# Patient Record
Sex: Female | Born: 1950
Health system: Southern US, Community
[De-identification: ages and names within clinical notes are randomized; demographics above are authoritative.]

## PROBLEM LIST (undated history)

## (undated) DIAGNOSIS — J449 Chronic obstructive pulmonary disease, unspecified: Secondary | ICD-10-CM

## (undated) DIAGNOSIS — Z972 Presence of dental prosthetic device (complete) (partial): Secondary | ICD-10-CM

## (undated) DIAGNOSIS — M1990 Unspecified osteoarthritis, unspecified site: Secondary | ICD-10-CM

## (undated) DIAGNOSIS — J45909 Unspecified asthma, uncomplicated: Secondary | ICD-10-CM

## (undated) DIAGNOSIS — R519 Headache, unspecified: Secondary | ICD-10-CM

## (undated) DIAGNOSIS — K219 Gastro-esophageal reflux disease without esophagitis: Secondary | ICD-10-CM

## (undated) DIAGNOSIS — K529 Noninfective gastroenteritis and colitis, unspecified: Secondary | ICD-10-CM

## (undated) DIAGNOSIS — E785 Hyperlipidemia, unspecified: Secondary | ICD-10-CM

## (undated) DIAGNOSIS — Z973 Presence of spectacles and contact lenses: Secondary | ICD-10-CM

## (undated) HISTORY — PX: CATARACT EXTRACTION W/ INTRAOCULAR LENS IMPLANT: SHX1309

## (undated) HISTORY — PX: ABDOMINAL HYSTERECTOMY: SHX81

## (undated) HISTORY — PX: CERVICAL FUSION: SHX112

## (undated) HISTORY — DX: Hyperlipidemia, unspecified: E78.5

## (undated) HISTORY — PX: TOENAIL EXCISION: SHX183

## (undated) HISTORY — PX: COLONOSCOPY: SHX5424

---

## 2002-03-19 DIAGNOSIS — F4320 Adjustment disorder, unspecified: Secondary | ICD-10-CM | POA: Insufficient documentation

## 2002-03-19 DIAGNOSIS — Z720 Tobacco use: Secondary | ICD-10-CM | POA: Insufficient documentation

## 2002-03-19 DIAGNOSIS — G4700 Insomnia, unspecified: Secondary | ICD-10-CM | POA: Insufficient documentation

## 2005-10-30 ENCOUNTER — Ambulatory Visit: Payer: Self-pay | Admitting: Specialist

## 2005-12-05 ENCOUNTER — Ambulatory Visit (HOSPITAL_COMMUNITY): Admission: RE | Admit: 2005-12-05 | Discharge: 2005-12-06 | Payer: Self-pay | Admitting: Neurosurgery

## 2006-03-29 ENCOUNTER — Emergency Department: Payer: Self-pay | Admitting: Emergency Medicine

## 2006-07-01 DIAGNOSIS — J449 Chronic obstructive pulmonary disease, unspecified: Secondary | ICD-10-CM | POA: Insufficient documentation

## 2006-09-11 ENCOUNTER — Ambulatory Visit: Payer: Self-pay | Admitting: Gastroenterology

## 2006-09-11 DIAGNOSIS — K5790 Diverticulosis of intestine, part unspecified, without perforation or abscess without bleeding: Secondary | ICD-10-CM | POA: Insufficient documentation

## 2006-09-11 LAB — HM COLONOSCOPY

## 2006-10-22 ENCOUNTER — Ambulatory Visit: Payer: Self-pay | Admitting: Family Medicine

## 2006-11-05 ENCOUNTER — Ambulatory Visit: Payer: Self-pay | Admitting: Family Medicine

## 2007-01-12 ENCOUNTER — Emergency Department: Payer: Self-pay | Admitting: Emergency Medicine

## 2007-05-06 ENCOUNTER — Ambulatory Visit: Payer: Self-pay | Admitting: Family Medicine

## 2007-05-06 LAB — HM MAMMOGRAPHY

## 2007-12-07 ENCOUNTER — Other Ambulatory Visit: Payer: Self-pay

## 2007-12-07 ENCOUNTER — Emergency Department: Payer: Self-pay | Admitting: Emergency Medicine

## 2007-12-13 ENCOUNTER — Other Ambulatory Visit: Payer: Self-pay

## 2007-12-13 ENCOUNTER — Emergency Department: Payer: Self-pay | Admitting: Emergency Medicine

## 2008-04-24 ENCOUNTER — Emergency Department: Payer: Self-pay | Admitting: Emergency Medicine

## 2008-05-03 ENCOUNTER — Ambulatory Visit: Payer: Self-pay | Admitting: Emergency Medicine

## 2008-10-26 LAB — HM COLONOSCOPY

## 2009-01-05 ENCOUNTER — Emergency Department: Payer: Self-pay | Admitting: Emergency Medicine

## 2011-01-12 ENCOUNTER — Emergency Department: Payer: Self-pay | Admitting: Emergency Medicine

## 2011-01-28 ENCOUNTER — Ambulatory Visit: Payer: Self-pay | Admitting: Family Medicine

## 2012-12-10 ENCOUNTER — Ambulatory Visit: Payer: Self-pay | Admitting: Family Medicine

## 2013-11-12 ENCOUNTER — Other Ambulatory Visit: Payer: Self-pay | Admitting: Urgent Care

## 2013-11-12 LAB — CBC WITH DIFFERENTIAL/PLATELET
BASOS ABS: 0 10*3/uL (ref 0.0–0.1)
BASOS PCT: 1.2 %
EOS ABS: 0.2 10*3/uL (ref 0.0–0.7)
EOS PCT: 5.7 %
HCT: 40.4 % (ref 35.0–47.0)
HGB: 13.2 g/dL (ref 12.0–16.0)
Lymphocyte #: 1.1 10*3/uL (ref 1.0–3.6)
Lymphocyte %: 29.4 %
MCH: 31.7 pg (ref 26.0–34.0)
MCHC: 32.8 g/dL (ref 32.0–36.0)
MCV: 97 fL (ref 80–100)
Monocyte #: 0.3 x10 3/mm (ref 0.2–0.9)
Monocyte %: 8 %
NEUTROS ABS: 2.1 10*3/uL (ref 1.4–6.5)
NEUTROS PCT: 55.7 %
PLATELETS: 264 10*3/uL (ref 150–440)
RBC: 4.18 10*6/uL (ref 3.80–5.20)
RDW: 13.3 % (ref 11.5–14.5)
WBC: 3.8 10*3/uL (ref 3.6–11.0)

## 2013-11-12 LAB — COMPREHENSIVE METABOLIC PANEL
ALBUMIN: 3.5 g/dL (ref 3.4–5.0)
ALK PHOS: 119 U/L — AB
ANION GAP: 11 (ref 7–16)
BILIRUBIN TOTAL: 0.5 mg/dL (ref 0.2–1.0)
BUN: 8 mg/dL (ref 7–18)
CALCIUM: 8.5 mg/dL (ref 8.5–10.1)
CREATININE: 0.59 mg/dL — AB (ref 0.60–1.30)
Chloride: 105 mmol/L (ref 98–107)
Co2: 25 mmol/L (ref 21–32)
GLUCOSE: 124 mg/dL — AB (ref 65–99)
Osmolality: 281 (ref 275–301)
Potassium: 3.9 mmol/L (ref 3.5–5.1)
SGOT(AST): 16 U/L (ref 15–37)
SGPT (ALT): 18 U/L
Sodium: 141 mmol/L (ref 136–145)
TOTAL PROTEIN: 6.8 g/dL (ref 6.4–8.2)

## 2013-11-12 LAB — TSH: Thyroid Stimulating Horm: 1.23 u[IU]/mL

## 2013-11-26 ENCOUNTER — Ambulatory Visit: Payer: Self-pay | Admitting: Gastroenterology

## 2013-11-26 LAB — HM COLONOSCOPY

## 2014-02-08 ENCOUNTER — Emergency Department: Payer: Self-pay | Admitting: Internal Medicine

## 2014-08-14 ENCOUNTER — Emergency Department
Admission: EM | Admit: 2014-08-14 | Discharge: 2014-08-14 | Disposition: A | Discharge status: Home / Self Care | Payer: BLUE CROSS/BLUE SHIELD | Attending: Emergency Medicine | Admitting: Emergency Medicine

## 2014-08-14 ENCOUNTER — Emergency Department: Payer: BLUE CROSS/BLUE SHIELD

## 2014-08-14 ENCOUNTER — Encounter: Payer: Self-pay | Admitting: General Practice

## 2014-08-14 DIAGNOSIS — Y998 Other external cause status: Secondary | ICD-10-CM | POA: Diagnosis not present

## 2014-08-14 DIAGNOSIS — S93602A Unspecified sprain of left foot, initial encounter: Secondary | ICD-10-CM | POA: Diagnosis not present

## 2014-08-14 DIAGNOSIS — Y9389 Activity, other specified: Secondary | ICD-10-CM | POA: Diagnosis not present

## 2014-08-14 DIAGNOSIS — Y9289 Other specified places as the place of occurrence of the external cause: Secondary | ICD-10-CM | POA: Diagnosis not present

## 2014-08-14 DIAGNOSIS — Z720 Tobacco use: Secondary | ICD-10-CM | POA: Diagnosis not present

## 2014-08-14 DIAGNOSIS — S93402A Sprain of unspecified ligament of left ankle, initial encounter: Secondary | ICD-10-CM | POA: Diagnosis not present

## 2014-08-14 DIAGNOSIS — S99922A Unspecified injury of left foot, initial encounter: Secondary | ICD-10-CM | POA: Diagnosis present

## 2014-08-14 DIAGNOSIS — W108XXA Fall (on) (from) other stairs and steps, initial encounter: Secondary | ICD-10-CM | POA: Diagnosis not present

## 2014-08-14 HISTORY — DX: Noninfective gastroenteritis and colitis, unspecified: K52.9

## 2014-08-14 HISTORY — DX: Unspecified asthma, uncomplicated: J45.909

## 2014-08-14 HISTORY — DX: Chronic obstructive pulmonary disease, unspecified: J44.9

## 2014-08-14 MED ORDER — OXYCODONE-ACETAMINOPHEN 5-325 MG PO TABS
1.0000 | ORAL_TABLET | Freq: Once | ORAL | Status: AC
  Administered 2014-08-14: 1 via ORAL

## 2014-08-14 MED ORDER — DIPHENHYDRAMINE HCL 25 MG PO CAPS
25.0000 mg | ORAL_CAPSULE | Freq: Once | ORAL | Status: AC
  Administered 2014-08-14: 25 mg via ORAL

## 2014-08-14 MED ORDER — DIPHENHYDRAMINE HCL 25 MG PO CAPS
ORAL_CAPSULE | ORAL | Status: AC
  Filled 2014-08-14: qty 1

## 2014-08-14 MED ORDER — OXYCODONE-ACETAMINOPHEN 5-325 MG PO TABS: 1.0000 | ORAL_TABLET | Freq: Three times a day (TID) | ORAL | Status: DC | PRN

## 2014-08-14 MED ORDER — OXYCODONE-ACETAMINOPHEN 5-325 MG PO TABS
ORAL_TABLET | ORAL | Status: AC
  Filled 2014-08-14: qty 1

## 2014-08-14 NOTE — ED Notes (Signed)
Pt. Arrived to ed from home with reports of falling down stairs today. Pt reports tripping and falling down accidentally due to lost of balance. Pt reports "i heard a pop". Pt reports painful left foot and ankle pain. Swelling noted to top of left foot. Pt able to move foot but reports increase pain.

## 2014-08-14 NOTE — ED Notes (Signed)
NAD noted at time of D/C. Pt denies questions or concerns. Pt ambulatory to the lobby at this time.  

## 2014-08-14 NOTE — Discharge Instructions (Signed)
Take medication as prescribed. Use crutches and splint as long as pain continues. Apply ice and elevate.  Follow-up with orthopedic next week as needed for continued pain.  Return to the ER for new or worsening concerns.  Ankle Sprain An ankle sprain is an injury to the strong, fibrous tissues (ligaments) that hold the bones of your ankle joint together.  CAUSES An ankle sprain is usually caused by a fall or by twisting your ankle. Ankle sprains most commonly occur when you step on the outer edge of your foot, and your ankle turns inward. People who participate in sports are more prone to these types of injuries.  SYMPTOMS   Pain in your ankle. The pain may be present at rest or only when you are trying to stand or walk.  Swelling.  Bruising. Bruising may develop immediately or within 1 to 2 days after your injury.  Difficulty standing or walking, particularly when turning corners or changing directions. DIAGNOSIS  Your caregiver will ask you details about your injury and perform a physical exam of your ankle to determine if you have an ankle sprain. During the physical exam, your caregiver will press on and apply pressure to specific areas of your foot and ankle. Your caregiver will try to move your ankle in certain ways. An X-ray exam may be done to be sure a bone was not broken or a ligament did not separate from one of the bones in your ankle (avulsion fracture).  TREATMENT  Certain types of braces can help stabilize your ankle. Your caregiver can make a recommendation for this. Your caregiver may recommend the use of medicine for pain. If your sprain is severe, your caregiver may refer you to a surgeon who helps to restore function to parts of your skeletal system (orthopedist) or a physical therapist. HOME CARE INSTRUCTIONS   Apply ice to your injury for 1-2 days or as directed by your caregiver. Applying ice helps to reduce inflammation and pain.  Put ice in a plastic bag.  Place  a towel between your skin and the bag.  Leave the ice on for 15-20 minutes at a time, every 2 hours while you are awake.  Only take over-the-counter or prescription medicines for pain, discomfort, or fever as directed by your caregiver.  Elevate your injured ankle above the level of your heart as much as possible for 2-3 days.  If your caregiver recommends crutches, use them as instructed. Gradually put weight on the affected ankle. Continue to use crutches or a cane until you can walk without feeling pain in your ankle.  If you have a plaster splint, wear the splint as directed by your caregiver. Do not rest it on anything harder than a pillow for the first 24 hours. Do not put weight on it. Do not get it wet. You may take it off to take a shower or bath.  You may have been given an elastic bandage to wear around your ankle to provide support. If the elastic bandage is too tight (you have numbness or tingling in your foot or your foot becomes cold and blue), adjust the bandage to make it comfortable.  If you have an air splint, you may blow more air into it or let air out to make it more comfortable. You may take your splint off at night and before taking a shower or bath. Wiggle your toes in the splint several times per day to decrease swelling. SEEK MEDICAL CARE IF:   You  have rapidly increasing bruising or swelling.  Your toes feel extremely cold or you lose feeling in your foot.  Your pain is not relieved with medicine. SEEK IMMEDIATE MEDICAL CARE IF:  Your toes are numb or blue.  You have severe pain that is increasing. MAKE SURE YOU:   Understand these instructions.  Will watch your condition.  Will get help right away if you are not doing well or get worse. Document Released: 02/18/2005 Document Revised: 11/13/2011 Document Reviewed: 03/02/2011 [facility] Patient Information 2015 [place], [place]. This information is not intended to replace advice given to you by your health  care provider. Make sure you discuss any questions you have with your health care provider.  Foot Sprain The muscles and cord like structures which attach muscle to bone (tendons) that surround the feet are made up of units. A foot sprain can occur at the weakest spot in any of these units. This condition is most often caused by injury to or overuse of the foot, as from playing contact sports, or aggravating a previous injury, or from poor conditioning, or obesity. SYMPTOMS  Pain with movement of the foot.  Tenderness and swelling at the injury site.  Loss of strength is present in moderate or severe sprains. THE THREE GRADES OR SEVERITY OF FOOT SPRAIN ARE:  Mild (Grade I): Slightly pulled muscle without tearing of muscle or tendon fibers or loss of strength.  Moderate (Grade II): Tearing of fibers in a muscle, tendon, or at the attachment to bone, with small decrease in strength.  Severe (Grade III): Rupture of the muscle-tendon-bone attachment, with separation of fibers. Severe sprain requires surgical repair. Often repeating (chronic) sprains are caused by overuse. Sudden (acute) sprains are caused by direct injury or over-use. DIAGNOSIS  Diagnosis of this condition is usually by your own observation. If problems continue, a caregiver may be required for further evaluation and treatment. X-rays may be required to make sure there are not breaks in the bones (fractures) present. Continued problems may require physical therapy for treatment. PREVENTION  Use strength and conditioning exercises appropriate for your sport.  Warm up properly prior to working out.  Use athletic shoes that are made for the sport you are participating in.  Allow adequate time for healing. Early return to activities makes repeat injury more likely, and can lead to an unstable arthritic foot that can result in prolonged disability. Mild sprains generally heal in 3 to 10 days, with moderate and severe sprains  taking 2 to 10 weeks. Your caregiver can help you determine the proper time required for healing. HOME CARE INSTRUCTIONS   Apply ice to the injury for 15-20 minutes, 03-04 times per day. Put the ice in a plastic bag and place a towel between the bag of ice and your skin.  An elastic wrap (like an Ace bandage) may be used to keep swelling down.  Keep foot above the level of the heart, or at least raised on a footstool, when swelling and pain are present.  Try to avoid use other than gentle range of motion while the foot is painful. Do not resume use until instructed by your caregiver. Then begin use gradually, not increasing use to the point of pain. If pain does develop, decrease use and continue the above measures, gradually increasing activities that do not cause discomfort, until you gradually achieve normal use.  Use crutches if and as instructed, and for the length of time instructed.  Keep injured foot and ankle wrapped between  treatments.  Massage foot and ankle for comfort and to keep swelling down. Massage from the toes up towards the knee.  Only take over-the-counter or prescription medicines for pain, discomfort, or fever as directed by your caregiver. SEEK IMMEDIATE MEDICAL CARE IF:   Your pain and swelling increase, or pain is not controlled with medications.  You have loss of feeling in your foot or your foot turns cold or blue.  You develop new, unexplained symptoms, or an increase of the symptoms that brought you to your caregiver. MAKE SURE YOU:   Understand these instructions.  Will watch your condition.  Will get help right away if you are not doing well or get worse. Document Released: 08/10/2001 Document Revised: 05/13/2011 Document Reviewed: 10/08/2007 [facility] Patient Information 2015 [place], [place]. This information is not intended to replace advice given to you by your health care provider. Make sure you discuss any questions you have with your health care  provider.

## 2014-08-14 NOTE — ED Provider Notes (Signed)
[facility] Emergency Department Provider Note  ____________________________________________  Time seen: Approximately 2:05 PM  I have reviewed the triage vital signs and the nursing notes.   HISTORY  Chief Complaint Foot Pain and Fall   HPI Melissa Buck is a 64 y.o. female presents to ER for complaints of left ankle and left foot pain. Patient states that this afternoon she was going down the outside steps at her daughter's house after checking on her dogs and states that she miss stepped on the edge of the step and rolled left foot. Patient states that she then fell forward however didn't hurt anything else. States fell down 3 steps. Denies head injury or loss of consciousness. Patient states that she has been unable to put any weight on her left foot. States pain is 10 out of 10 aching and throbbing. Denies pain radiation.   Denies weakness, head injury, neck or back pain, chest pain, shortness of breath, abdominal pain or other complaints.    Past Medical History  Diagnosis Date  . Asthma   . COPD (chronic obstructive pulmonary disease)   . Colitis   TIA hyperlipidemia  There are no active problems to display for this patient.   Past Surgical History  Procedure Laterality Date  . Colonoscopy      No current outpatient prescriptions on file.  Allergies Review of patient's allergies indicates no known allergies.  Allergies: NSAIDs per pt Gi distress/bleeding   States "has to take benadryl with all pain medication" otherwise will have itching. Denies other side effects, rash, swelling or shortness of breath.  No family history on file.  Social History History  Substance Use Topics  . Smoking status: Current Every Day Smoker -- 0.50 packs/day    Types: Cigarettes  . Smokeless tobacco: Never Used  . Alcohol Use: Yes    Review of Systems Constitutional: No fever/chills Eyes: No visual changes. ENT: No sore throat. Cardiovascular:  Denies chest pain. Respiratory: Denies shortness of breath. Gastrointestinal: No abdominal pain.  No nausea, no vomiting.  No diarrhea.  No constipation. Genitourinary: Negative for dysuria. Musculoskeletal: Negative for back pain. Left foot and ankle pain.  Skin: Negative for rash. Neurological: Negative for headaches, focal weakness or numbness.  10-point ROS otherwise negative.  ____________________________________________   PHYSICAL EXAM:  VITAL SIGNS: ED Triage Vitals  Enc Vitals Group     BP 08/14/14 1238 110/83 mmHg     Pulse Rate 08/14/14 1238 95     Resp 08/14/14 1238 18     Temp 08/14/14 1238 98.1 F (36.7 C)     Temp Source 08/14/14 1238 Oral     SpO2 08/14/14 1238 98 %     Weight 08/14/14 1238 150 lb (68.04 kg)     Height 08/14/14 1238 5\' 1"  (1.549 m)     Head Cir --      Peak Flow --      Pain Score 08/14/14 1239 10     Pain Loc --      Pain Edu? --      Excl. in GC? --     Constitutional: Alert and oriented. Well appearing and in no acute distress. Eyes: Conjunctivae are normal. PERRL. EOMI. Head: Atraumatic. Nose: No congestion/rhinnorhea. Mouth/Throat: Mucous membranes are moist.  Oropharynx non-erythematous. Neck: No stridor.  No cervical spine tenderness to palpation. Hematological/Lymphatic/Immunilogical: No cervical lymphadenopathy. Cardiovascular: Normal rate, regular rhythm. Grossly normal heart sounds.  Good peripheral circulation. Respiratory: Normal respiratory effort.  No retractions. Lungs  CTAB. Gastrointestinal: Soft and nontender. No distention. No abdominal bruits. No CVA tenderness. Musculoskeletal: No lower extremity tenderness nor edema.  No joint effusions. Left lateral ankle, lateral foot and dorsal foot with mod TTP, no swelling or ecchymosis. Skin intact. Pain with rotation. Sensation and motor intact. Pedal pulses equal bilaterally and easily found.  Neurologic:  Normal speech and language. No gross focal neurologic deficits are  appreciated. Speech is normal. No gait instability. Skin:  Skin is warm, dry and intact. No rash noted. Psychiatric: Mood and affect are normal. Speech and behavior are normal.  ____________________________________________   RADIOLOGY LEFT ANKLE COMPLETE - 3+ VIEW  COMPARISON: None.  FINDINGS: There is no evidence of acute fracture, dislocation, or joint effusion.  There is no evidence of arthropathy or other focal bone abnormality.  Soft tissues are unremarkable.  IMPRESSION: Negative.   Electronically Signed By: Harmon Pier M.D. On: 08/14/2014 15:00   LEFT FOOT - COMPLETE 3+ VIEW  COMPARISON: None.  FINDINGS: There is no evidence of fracture or dislocation. There is no evidence of arthropathy or other focal bone abnormality. Soft tissues are unremarkable.  IMPRESSION: Negative.   Electronically Signed By: Elige Ko On: 08/14/2014 15:02 ____________________________________________   PROCEDURES  Procedure(s) performed:  SPLINT APPLICATION Date/Time: 4:14 PM Authorized by: Renford Dills Consent: Verbal consent obtained. Risks and benefits: risks, benefits and alternatives were discussed Consent given by: patient Splint applied by: ed technician Location details left foot Splint type: velcro stirrup and ace and crutches Post-procedure: The splinted body part was neurovascularly unchanged following the procedure. Patient tolerance: Patient tolerated the procedure well with no immediate complications.   ____________________________________________   INITIAL IMPRESSION / ASSESSMENT AND PLAN / ED COURSE  Pertinent labs & imaging results that were available during my care of the patient were reviewed by me and considered in my medical decision making (see chart for details).  Well appearing. No acute distress. Left foot and ankle pain post twist and fall. Denies other injury. Xray negative for acute bony abnormality. Ice. Elevate and  splint. Follow up with ortho prn. Pt and daughter agree to plan.  ____________________________________________   FINAL CLINICAL IMPRESSION(S) / ED DIAGNOSES  Final diagnoses:  Left ankle sprain, initial encounter  Foot sprain, left, initial encounter      Renford Dills, NP 08/14/14 1618  Jene Every, MD 08/15/14 1745

## 2014-08-20 ENCOUNTER — Other Ambulatory Visit: Payer: Self-pay | Admitting: Family Medicine

## 2014-08-20 DIAGNOSIS — G43809 Other migraine, not intractable, without status migrainosus: Secondary | ICD-10-CM

## 2014-08-22 ENCOUNTER — Other Ambulatory Visit: Payer: Self-pay | Admitting: Family Medicine

## 2014-08-22 DIAGNOSIS — G43809 Other migraine, not intractable, without status migrainosus: Secondary | ICD-10-CM

## 2014-08-22 DIAGNOSIS — G43909 Migraine, unspecified, not intractable, without status migrainosus: Secondary | ICD-10-CM | POA: Insufficient documentation

## 2014-11-08 ENCOUNTER — Other Ambulatory Visit: Payer: Self-pay | Admitting: Family Medicine

## 2014-11-08 DIAGNOSIS — G43809 Other migraine, not intractable, without status migrainosus: Secondary | ICD-10-CM

## 2014-11-08 NOTE — Telephone Encounter (Signed)
Last OV 11/05/2013  Thanks,   -Vernona Rieger

## 2014-11-14 ENCOUNTER — Other Ambulatory Visit: Payer: Self-pay | Admitting: Family Medicine

## 2014-11-14 DIAGNOSIS — F419 Anxiety disorder, unspecified: Secondary | ICD-10-CM | POA: Insufficient documentation

## 2014-11-14 NOTE — Telephone Encounter (Signed)
Last OV 11/2013  Thanks,   -Melissa Buck  

## 2014-11-14 NOTE — Telephone Encounter (Signed)
Ok to call in rx.  Thanks.  

## 2014-12-05 ENCOUNTER — Other Ambulatory Visit: Payer: Self-pay | Admitting: Family Medicine

## 2014-12-05 DIAGNOSIS — G43809 Other migraine, not intractable, without status migrainosus: Secondary | ICD-10-CM

## 2014-12-05 DIAGNOSIS — F419 Anxiety disorder, unspecified: Secondary | ICD-10-CM

## 2014-12-05 NOTE — Telephone Encounter (Signed)
Last OV 11/2013  Thanks,   -Shawan Corella  

## 2014-12-05 NOTE — Telephone Encounter (Signed)
Ok to call in one rx. Needs ov. Thanks.

## 2014-12-05 NOTE — Telephone Encounter (Signed)
Last OV 11/2013  Thanks,   -Laura  

## 2015-01-03 ENCOUNTER — Telehealth: Payer: Self-pay | Admitting: Family Medicine

## 2015-01-03 ENCOUNTER — Other Ambulatory Visit: Payer: Self-pay | Admitting: Family Medicine

## 2015-01-03 DIAGNOSIS — F419 Anxiety disorder, unspecified: Secondary | ICD-10-CM

## 2015-03-10 ENCOUNTER — Encounter: Payer: Self-pay | Admitting: Family Medicine

## 2015-03-10 ENCOUNTER — Ambulatory Visit (INDEPENDENT_AMBULATORY_CARE_PROVIDER_SITE_OTHER): Payer: BLUE CROSS/BLUE SHIELD | Admitting: Family Medicine

## 2015-03-10 VITALS — BP 122/80 | HR 74 | Temp 97.7°F | Resp 16 | Ht 61.5 in | Wt 158.0 lb

## 2015-03-10 DIAGNOSIS — G43809 Other migraine, not intractable, without status migrainosus: Secondary | ICD-10-CM

## 2015-03-10 DIAGNOSIS — F419 Anxiety disorder, unspecified: Secondary | ICD-10-CM

## 2015-03-10 DIAGNOSIS — L309 Dermatitis, unspecified: Secondary | ICD-10-CM | POA: Insufficient documentation

## 2015-03-10 DIAGNOSIS — G43909 Migraine, unspecified, not intractable, without status migrainosus: Secondary | ICD-10-CM | POA: Insufficient documentation

## 2015-03-10 DIAGNOSIS — G4700 Insomnia, unspecified: Secondary | ICD-10-CM

## 2015-03-10 DIAGNOSIS — H9190 Unspecified hearing loss, unspecified ear: Secondary | ICD-10-CM | POA: Insufficient documentation

## 2015-03-10 DIAGNOSIS — R1011 Right upper quadrant pain: Secondary | ICD-10-CM | POA: Insufficient documentation

## 2015-03-10 DIAGNOSIS — E7800 Pure hypercholesterolemia, unspecified: Secondary | ICD-10-CM | POA: Diagnosis not present

## 2015-03-10 DIAGNOSIS — R197 Diarrhea, unspecified: Secondary | ICD-10-CM | POA: Insufficient documentation

## 2015-03-10 MED ORDER — MELOXICAM 15 MG PO TABS: 15.0000 mg | ORAL_TABLET | Freq: Every day | ORAL | Status: DC | PRN

## 2015-03-10 MED ORDER — PROMETHAZINE HCL 25 MG/ML IJ SOLN
25.0000 mg | Freq: Once | INTRAMUSCULAR | Status: AC
  Administered 2015-03-10: 25 mg via INTRAMUSCULAR

## 2015-03-10 MED ORDER — METOCLOPRAMIDE HCL 10 MG PO TABS: 10.0000 mg | ORAL_TABLET | Freq: Three times a day (TID) | ORAL | Status: DC

## 2015-03-10 MED ORDER — TIZANIDINE HCL 4 MG PO TABS
ORAL_TABLET | ORAL | Status: DC
Start: 2015-03-10 — End: 2015-05-10

## 2015-03-10 MED ORDER — KETOROLAC TROMETHAMINE 60 MG/2ML IM SOLN
60.0000 mg | Freq: Once | INTRAMUSCULAR | Status: AC
  Administered 2015-03-10 (×2): 60 mg via INTRAMUSCULAR

## 2015-03-10 MED ORDER — TRAZODONE HCL 100 MG PO TABS: 100.0000 mg | ORAL_TABLET | Freq: Every day | ORAL | Status: DC

## 2015-03-10 MED ORDER — SIMVASTATIN 20 MG PO TABS
ORAL_TABLET | ORAL | Status: DC
Start: 2015-03-10 — End: 2016-01-30

## 2015-03-10 MED ORDER — DIVALPROEX SODIUM ER 500 MG PO TB24: ORAL_TABLET | ORAL | Status: DC

## 2015-03-10 MED ORDER — ESCITALOPRAM OXALATE 20 MG PO TABS: 20.0000 mg | ORAL_TABLET | Freq: Every day | ORAL | Status: DC

## 2015-03-10 NOTE — Progress Notes (Signed)
Patient ID: Melissa PasseyDebra H Studstill, female   DOB: 06/11/1950, 65 y.o.   MRN: [id number]       Patient: Melissa Buck Female    DOB: 11/23/1950   65 y.o.   MRN: [id number] Visit Date: 03/10/2015  Today's Provider: Lorie PhenixNancy Amariss Detamore, MD   Chief Complaint  Patient presents with  . Migraine   Subjective:    HPI   Follow up for Migrain  The patient was last seen for this 15  months ago. Changes made at last visit include started metoclopramide 10 mg.  She reports excellent compliance with treatment. She feels that condition is Improved. She is not having side effects. Patient reports headache started last night. Patient reports she has been out of her medications for about 2 weeks. Patient reports nausea, and photophobia. Patient reports she has been taking OTC Tylenol Extra Strength 3 tablet every 6 hours, pt reports no relief.  ------------------------------------------------------------------------------------      Allergies  Allergen Reactions  . Aspirin   . Erythromycin     Allergic to Mycins.  . Hydrocodone-Acetaminophen Itching    GI Upset  . Sulfa Antibiotics     Throat closes up   Previous Medications   CETIRIZINE (ZYRTEC ALLERGY) 10 MG TABLET    Take 1 tablet by mouth daily.   DIVALPROEX (DEPAKOTE ER) 500 MG 24 HR TABLET    TAKE 2 TABLETS BY MOUTH EVERY EVENING.   ESCITALOPRAM (LEXAPRO) 20 MG TABLET    TAKE 1 TABLET BY MOUTH EVERY DAY   LORAZEPAM (ATIVAN) 0.5 MG TABLET    TAKE 1 TABLET BY MOUTH TWICE DAILY AS NEEDED.   MELOXICAM (MOBIC) 15 MG TABLET    TAKE 1 TABLET BY MOUTH DAILY AS NEEDED   METOCLOPRAMIDE (REGLAN) 10 MG TABLET    Take by mouth. 1/2-1 tablet three times a day as needed for headache   SIMVASTATIN (ZOCOR) 20 MG TABLET    TK 1 T PO HS   TIZANIDINE (ZANAFLEX) 4 MG TABLET    TAKE 1 TO 3 TABLETS BY MOUTH EVERY 8 HOURS AS NEEDED FOR HEADACHE. MAX OF 8 TABLETS PER DAY   TRAZODONE (DESYREL) 100 MG TABLET    Take 1 tablet by mouth daily.    Review of Systems    Constitutional: Negative.   Eyes: Positive for photophobia.  Cardiovascular: Negative.   Gastrointestinal: Positive for nausea.  Neurological: Positive for headaches.    Social History  Substance Use Topics  . Smoking status: Current Every Day Smoker -- 0.50 packs/day    Types: Cigarettes  . Smokeless tobacco: Never Used  . Alcohol Use: 0.6 oz/week    1 Glasses of wine per week   Objective:   BP 122/80 mmHg  Pulse 74  Temp(Src) 97.7 F (36.5 C)  Resp 16  Ht 5' 1.5" (1.562 m)  Wt 158 lb (71.668 kg)  BMI 29.37 kg/m2  SpO2 98%  Physical Exam  Constitutional: She appears well-developed and well-nourished.  Eyes: Conjunctivae and EOM are normal. Pupils are equal, round, and reactive to light.  Neck: Normal range of motion. Neck supple.  Pulmonary/Chest: Effort normal and breath sounds normal.  Neurological: She has normal reflexes.        Assessment & Plan:     1. Other type of migraine Worsening.  Toradol and Phenergan today.  Refill her medication. Recheck ov in 6 months.   - metoCLOPramide (REGLAN) 10 MG tablet; Take 1 tablet (10 mg total) by mouth 3 (three) times daily before meals.  1/2-1 tablet three times a day as needed for headache  Dispense: 30 tablet; Refill: 5 - tiZANidine (ZANAFLEX) 4 MG tablet; TAKE 1 TO 3 TABLETS BY MOUTH EVERY 8 HOURS AS NEEDED FOR HEADACHE. MAX OF 8 TABLETS PER DAY  Dispense: 90 tablet; Refill: 5 - meloxicam (MOBIC) 15 MG tablet; Take 1 tablet (15 mg total) by mouth daily as needed.  Dispense: 30 tablet; Refill: 5 - divalproex (DEPAKOTE ER) 500 MG 24 hr tablet; TAKE 2 TABLETS BY MOUTH EVERY EVENING.  Dispense: 60 tablet; Refill: 5 - ketorolac (TORADOL) injection 60 mg; Inject 2 mLs (60 mg total) into the muscle once. - promethazine (PHENERGAN) injection 25 mg; Inject 1 mL (25 mg total) into the muscle once.  2. Anxiety Stable. - escitalopram (LEXAPRO) 20 MG tablet; Take 1 tablet (20 mg total) by mouth daily.  Dispense: 30 tablet; Refill:  5  3. Hypercholesteremia - simvastatin (ZOCOR) 20 MG tablet; TK 1 T PO HS  Dispense: 30 tablet; Refill: 5  4. Cannot sleep - traZODone (DESYREL) 100 MG tablet; Take 1 tablet (100 mg total) by mouth daily.  Dispense: 30 tablet; Refill: 5     Patient seen and examined by Dr. Leo Grosser, and note scribed by Liz Beach. Dimas, CMA.  I have reviewed the document for accuracy and completeness and I agree with above. - Leo Grosser, MD     Lorie Phenix, MD  [facility]

## 2015-05-10 ENCOUNTER — Encounter: Payer: Self-pay | Admitting: Family Medicine

## 2015-05-10 ENCOUNTER — Ambulatory Visit (INDEPENDENT_AMBULATORY_CARE_PROVIDER_SITE_OTHER): Payer: BLUE CROSS/BLUE SHIELD | Admitting: Family Medicine

## 2015-05-10 ENCOUNTER — Ambulatory Visit
Admission: RE | Admit: 2015-05-10 | Discharge: 2015-05-10 | Disposition: A | Discharge status: Home / Self Care | Payer: BLUE CROSS/BLUE SHIELD | Source: Ambulatory Visit | Attending: Family Medicine | Admitting: Family Medicine

## 2015-05-10 ENCOUNTER — Telehealth: Payer: Self-pay

## 2015-05-10 VITALS — BP 124/68 | HR 92 | Temp 97.9°F | Resp 16 | Wt 156.0 lb

## 2015-05-10 DIAGNOSIS — R05 Cough: Secondary | ICD-10-CM | POA: Insufficient documentation

## 2015-05-10 DIAGNOSIS — R509 Fever, unspecified: Secondary | ICD-10-CM | POA: Diagnosis not present

## 2015-05-10 DIAGNOSIS — J449 Chronic obstructive pulmonary disease, unspecified: Secondary | ICD-10-CM

## 2015-05-10 DIAGNOSIS — J40 Bronchitis, not specified as acute or chronic: Secondary | ICD-10-CM

## 2015-05-10 DIAGNOSIS — Z720 Tobacco use: Secondary | ICD-10-CM | POA: Diagnosis not present

## 2015-05-10 DIAGNOSIS — R059 Cough, unspecified: Secondary | ICD-10-CM

## 2015-05-10 DIAGNOSIS — G43809 Other migraine, not intractable, without status migrainosus: Secondary | ICD-10-CM

## 2015-05-10 LAB — POCT INFLUENZA A/B
INFLUENZA B, POC: NEGATIVE
Influenza A, POC: NEGATIVE

## 2015-05-10 MED ORDER — TIZANIDINE HCL 4 MG PO TABS: ORAL_TABLET | ORAL | Status: DC

## 2015-05-10 MED ORDER — HYDROCODONE-HOMATROPINE 5-1.5 MG/5ML PO SYRP: 5.0000 mL | ORAL_SOLUTION | Freq: Three times a day (TID) | ORAL | Status: DC | PRN

## 2015-05-10 MED ORDER — PREDNISONE 10 MG PO TABS: ORAL_TABLET | ORAL | Status: DC

## 2015-05-10 MED ORDER — OSELTAMIVIR PHOSPHATE 75 MG PO CAPS: 75.0000 mg | ORAL_CAPSULE | Freq: Two times a day (BID) | ORAL | Status: DC

## 2015-05-10 MED ORDER — ALBUTEROL SULFATE HFA 108 (90 BASE) MCG/ACT IN AERS: 2.0000 | INHALATION_SPRAY | Freq: Four times a day (QID) | RESPIRATORY_TRACT | Status: DC | PRN

## 2015-05-10 NOTE — Telephone Encounter (Signed)
-----   Message from Lorie PhenixNancy Maloney, MD sent at 05/10/2015  4:13 PM EST ----- No pneumonia. Thanks.

## 2015-05-10 NOTE — Progress Notes (Signed)
Subjective:    Patient ID: Melissa Buck, female    DOB: 11/06/1950, 65 y.o.   MRN: [id number]  URI  This is a new problem. The current episode started in the past 7 days (for past 3 days. ). The problem has been gradually worsening. The maximum temperature recorded prior to her arrival was 101 - 101.9 F. The fever has been present for 1 to 2 days. Associated symptoms include abdominal pain, chest pain (when she coughs. ), coughing (dry), diarrhea (colitis), headaches, joint pain, nausea, neck pain, rhinorrhea, sneezing, a sore throat, swollen glands and wheezing. Pertinent negatives include no congestion, dysuria, ear pain, plugged ear sensation, rash, sinus pain or vomiting. She has tried acetaminophen and NSAIDs (also meloxicam. ) for the symptoms. The treatment provided mild (helped with fever and myalgias but still with cough and feels bad.  ) relief.   Does still use the vapes.  Has tried to quit smoking. Has had a flu shot.  Does history of bronchitis. No history of pneumonia.     Review of Systems  Constitutional: Positive for chills and fatigue.  HENT: Positive for rhinorrhea, sneezing and sore throat. Negative for congestion and ear pain.   Respiratory: Positive for cough (dry), chest tightness and wheezing. Negative for choking.   Cardiovascular: Positive for chest pain (when she coughs. ).  Gastrointestinal: Positive for nausea, abdominal pain and diarrhea (colitis). Negative for vomiting.  Genitourinary: Negative for dysuria.  Musculoskeletal: Positive for myalgias, joint pain and neck pain.  Skin: Negative for rash.  Neurological: Positive for headaches.   BP 124/68 mmHg  Pulse 92  Temp(Src) 97.9 F (36.6 C) (Oral)  Resp 16  Wt 156 lb (70.761 kg)   Patient Active Problem List   Diagnosis Date Noted  . Abdominal pain, right upper quadrant 03/10/2015  . Dermatitis, eczematoid 03/10/2015  . D (diarrhea) 03/10/2015  . Difficulty hearing 03/10/2015  . Hypercholesteremia  03/10/2015  . Headache, migraine 03/10/2015  . Anxiety 11/14/2014  . Migraine 08/22/2014  . DD (diverticular disease) 09/11/2006  . CAFL (chronic airflow limitation) (HCC) 07/01/2006  . Adaptation reaction 03/19/2002  . Allergic rhinitis 03/19/2002  . Cannot sleep 03/19/2002  . Current tobacco use 03/19/2002   Past Medical History  Diagnosis Date  . Asthma   . COPD (chronic obstructive pulmonary disease) (HCC)   . Colitis    Current Outpatient Prescriptions on File Prior to Visit  Medication Sig  . cetirizine (ZYRTEC ALLERGY) 10 MG tablet Take 1 tablet by mouth daily.  . divalproex (DEPAKOTE ER) 500 MG 24 hr tablet TAKE 2 TABLETS BY MOUTH EVERY EVENING.  Marland Kitchen escitalopram (LEXAPRO) 20 MG tablet Take 1 tablet (20 mg total) by mouth daily.  Marland Kitchen LORazepam (ATIVAN) 0.5 MG tablet TAKE 1 TABLET BY MOUTH TWICE DAILY AS NEEDED.  Marland Kitchen meloxicam (MOBIC) 15 MG tablet Take 1 tablet (15 mg total) by mouth daily as needed.  . metoCLOPramide (REGLAN) 10 MG tablet Take 1 tablet (10 mg total) by mouth 3 (three) times daily before meals. 1/2-1 tablet three times a day as needed for headache  . simvastatin (ZOCOR) 20 MG tablet TK 1 T PO HS  . tiZANidine (ZANAFLEX) 4 MG tablet TAKE 1 TO 3 TABLETS BY MOUTH EVERY 8 HOURS AS NEEDED FOR HEADACHE. MAX OF 8 TABLETS PER DAY  . traZODone (DESYREL) 100 MG tablet Take 1 tablet (100 mg total) by mouth daily.   No current facility-administered medications on file prior to visit.   Allergies  Allergen Reactions  . Aspirin   . Erythromycin     Allergic to Mycins.  . Hydrocodone-Acetaminophen Itching    GI Upset  . Sulfa Antibiotics     Throat closes up   Past Surgical History  Procedure Laterality Date  . Colonoscopy     Social History   Social History  . Marital Status: Divorced    Spouse Name: N/A  . Number of Children: N/A  . Years of Education: N/A   Occupational History  . Not on file.   Social History Main Topics  . Smoking status: Current Every  Day Smoker -- 0.50 packs/day    Types: Cigarettes, E-cigarettes  . Smokeless tobacco: Never Used  . Alcohol Use: 0.6 oz/week    1 Glasses of wine per week  . Drug Use: No  . Sexual Activity: Not on file   Other Topics Concern  . Not on file   Social History Narrative   Family History  Problem Relation Age of Onset  . Healthy Sister        Objective:   Physical Exam  Constitutional: She is oriented to person, place, and time. She appears well-developed and well-nourished.  HENT:  Head: Normocephalic and atraumatic.  Right Ear: External ear normal.  Left Ear: External ear normal.  Mouth/Throat: Oropharynx is clear and moist.  Eyes: Conjunctivae and EOM are normal. Pupils are equal, round, and reactive to light.  Neck: Normal range of motion. Neck supple.  Cardiovascular: Normal rate and regular rhythm.   Pulmonary/Chest: Effort normal and breath sounds normal.  Neurological: She is alert and oriented to person, place, and time.  Psychiatric: She has a normal mood and affect. Her behavior is normal. Judgment and thought content normal.   BP 124/68 mmHg  Pulse 92  Temp(Src) 97.9 F (36.6 C) (Oral)  Resp 16  Wt 156 lb (70.761 kg)     Assessment & Plan:  1. Cough New problem. Worsening. Flu negative. Will check  CXR. Concerning for flu as fever higher than anticipated for  COPD exacerbation.   - POCT Influenza A/B - DG Chest 2 View; Future - HYDROcodone-homatropine (HYCODAN) 5-1.5 MG/5ML syrup; Take 5 mLs by mouth every 8 (eight) hours as needed for cough.  Dispense: 120 mL; Refill: 0  2. Chronic obstructive pulmonary disease, unspecified COPD type (HCC) Condition is worsening. Will increase medication for better control.   - predniSONE (DELTASONE) 10 MG tablet; 6 po for 1 day and then 5 po for 1 day and then 4 po for 1 day and 3 po for 1 day and then 2 po for 1 day and then 1 po for 1 day.  Dispense: 21 tablet; Refill: 0 - DG Chest 2 View; Future - albuterol (PROVENTIL  HFA;VENTOLIN HFA) 108 (90 Base) MCG/ACT inhaler; Inhale 2 puffs into the lungs every 6 (six) hours as needed for wheezing or shortness of breath.  Dispense: 1 Inhaler; Refill: 2  3. Current tobacco use Stressed importance of continued attempts at quitting.    4. Bronchitis As above.    5. Fever, unspecified fever cause Will treat for flu.  - oseltamivir (TAMIFLU) 75 MG capsule; Take 1 capsule (75 mg total) by mouth 2 (two) times daily.  Dispense: 10 capsule; Refill: 0  6. Other type of migraine Refilled medication to be taken as needed.  - tiZANidine (ZANAFLEX) 4 MG tablet; TAKE 1 TO 3 TABLETS BY MOUTH EVERY 8 HOURS AS NEEDED FOR HEADACHE. MAX OF 8 TABLETS PER DAY  Dispense: 90 tablet; Refill: 5   Lorie PhenixNancy Demetrion Wesby, MD

## 2015-05-10 NOTE — Telephone Encounter (Signed)
LMTCB Emily Drozdowski, CMA  

## 2015-05-10 NOTE — Telephone Encounter (Signed)
Left message to call back  

## 2015-05-10 NOTE — Telephone Encounter (Signed)
-----   Message from Lorie PhenixNancy Maloney, MD sent at 05/10/2015  2:38 PM EST ----- No pneumonia.  Please notify patient.  Thanks.

## 2015-05-10 NOTE — Telephone Encounter (Signed)
Advised patient of results.  

## 2015-05-11 ENCOUNTER — Encounter: Payer: Self-pay | Admitting: Family Medicine

## 2015-05-12 ENCOUNTER — Encounter: Payer: Self-pay | Admitting: Family Medicine

## 2015-05-15 ENCOUNTER — Other Ambulatory Visit: Payer: Self-pay | Admitting: Family Medicine

## 2015-05-15 DIAGNOSIS — R059 Cough, unspecified: Secondary | ICD-10-CM

## 2015-05-15 DIAGNOSIS — R05 Cough: Secondary | ICD-10-CM

## 2015-05-15 MED ORDER — HYDROCODONE-HOMATROPINE 5-1.5 MG/5ML PO SYRP: 5.0000 mL | ORAL_SOLUTION | Freq: Three times a day (TID) | ORAL | Status: DC | PRN

## 2015-05-15 NOTE — Telephone Encounter (Signed)
Pt contacted office for refill request on the following medications: HYDROcodone-homatropine (HYCODAN) 5-1.5 MG/5ML syrup.   Pt stated at first she was only taking at night but her cough got so bad she started taking it every 8 hours and it has helped a lot and started breaking things up when she does cough. Pt would like to pick up a refill for the medication if possible. Thanks TNP

## 2015-05-18 NOTE — Telephone Encounter (Signed)
Pt advised.   Thanks,   -Holt Woolbright  

## 2015-07-12 ENCOUNTER — Ambulatory Visit (INDEPENDENT_AMBULATORY_CARE_PROVIDER_SITE_OTHER): Payer: BLUE CROSS/BLUE SHIELD | Admitting: Family Medicine

## 2015-07-12 ENCOUNTER — Encounter: Payer: Self-pay | Admitting: Family Medicine

## 2015-07-12 VITALS — BP 120/70 | HR 76 | Temp 97.9°F | Resp 16 | Ht 61.25 in | Wt 153.0 lb

## 2015-07-12 DIAGNOSIS — Z0000 Encounter for general adult medical examination without abnormal findings: Secondary | ICD-10-CM | POA: Diagnosis not present

## 2015-07-12 DIAGNOSIS — Z1239 Encounter for other screening for malignant neoplasm of breast: Secondary | ICD-10-CM

## 2015-07-12 DIAGNOSIS — G43001 Migraine without aura, not intractable, with status migrainosus: Secondary | ICD-10-CM | POA: Diagnosis not present

## 2015-07-12 DIAGNOSIS — F419 Anxiety disorder, unspecified: Secondary | ICD-10-CM | POA: Diagnosis not present

## 2015-07-12 DIAGNOSIS — Z720 Tobacco use: Secondary | ICD-10-CM

## 2015-07-12 DIAGNOSIS — R319 Hematuria, unspecified: Secondary | ICD-10-CM | POA: Diagnosis not present

## 2015-07-12 DIAGNOSIS — E7800 Pure hypercholesterolemia, unspecified: Secondary | ICD-10-CM | POA: Diagnosis not present

## 2015-07-12 NOTE — Progress Notes (Signed)
Patient ID: LYRICAL SOWLE, female   DOB: 09/09/1950, 65 y.o.   MRN: [id number]       Patient: Melissa Buck, Female    DOB: 1950-05-31, 65 y.o.   MRN: [id number] Visit Date: 07/12/2015  Today's Provider: Lorie Phenix, MD   Chief Complaint  Patient presents with  . Annual Exam   Subjective:    Annual physical exam JARRAH SEHER is a 65 y.o. female who presents today for health maintenance and complete physical. She feels well. She reports exercising daily. She reports she is sleeping fairly well.  05/06/07 Mammogram-BI-RADS 2 11/26/13 Colonoscopy-hyperplastic polyps and colitis, Dr. Servando Snare -----------------------------------------------------------------  Migraine: Patient is requesting to have Botox injection for her migraines. Patient reports that she had one on Monday and lasted several days. Patient reports that she is getting migraines frequently and causes patient to get "sick".  Review of Systems  Constitutional: Negative.   Eyes: Negative.   Respiratory: Negative.   Cardiovascular: Negative.   Gastrointestinal: Negative.   Endocrine: Negative.   Genitourinary: Negative.   Musculoskeletal: Negative.   Skin: Negative.   Allergic/Immunologic: Negative.   Neurological: Positive for headaches.  Hematological: Negative.   Psychiatric/Behavioral: Negative.     Social History      She  reports that she has been smoking Cigarettes and E-cigarettes.  She has been smoking about 0.50 packs per day. She has never used smokeless tobacco. She reports that she drinks about 0.6 oz of alcohol per week. She reports that she does not use illicit drugs.       Social History   Social History  . Marital Status: Divorced    Spouse Name: N/A  . Number of Children: N/A  . Years of Education: N/A   Social History Main Topics  . Smoking status: Current Some Day Smoker -- 0.50 packs/day    Types: Cigarettes, E-cigarettes  . Smokeless tobacco: Never Used  . Alcohol Use: 0.6  oz/week    1 Glasses of wine per week  . Drug Use: No  . Sexual Activity: Not Asked   Other Topics Concern  . None   Social History Narrative    Past Medical History  Diagnosis Date  . Asthma   . COPD (chronic obstructive pulmonary disease) (HCC)   . Colitis      Patient Active Problem List   Diagnosis Date Noted  . Cough 05/10/2015  . Bronchitis 05/10/2015  . Fever 05/10/2015  . Abdominal pain, right upper quadrant 03/10/2015  . Dermatitis, eczematoid 03/10/2015  . D (diarrhea) 03/10/2015  . Difficulty hearing 03/10/2015  . Hypercholesteremia 03/10/2015  . Headache, migraine 03/10/2015  . Anxiety 11/14/2014  . Migraine 08/22/2014  . DD (diverticular disease) 09/11/2006  . CAFL (chronic airflow limitation) (HCC) 07/01/2006  . Adaptation reaction 03/19/2002  . Allergic rhinitis 03/19/2002  . Cannot sleep 03/19/2002  . Current tobacco use 03/19/2002    Past Surgical History  Procedure Laterality Date  . Colonoscopy    . Abdominal hysterectomy      Family History        Family Status  Relation Status Death Age  . Sister Alive   . Mother Deceased 75  . Father Other   . Brother Deceased 6    heart attack        Her family history includes Healthy in her sister; Heart disease in her brother.    Allergies  Allergen Reactions  . Aspirin   . Erythromycin     Allergic to Mycins.  Marland Kitchen  Hydrocodone-Acetaminophen Itching    GI Upset  . Sulfa Antibiotics     Throat closes up    Previous Medications   ALBUTEROL (PROVENTIL HFA;VENTOLIN HFA) 108 (90 BASE) MCG/ACT INHALER    Inhale 2 puffs into the lungs every 6 (six) hours as needed for wheezing or shortness of breath.   CETIRIZINE (ZYRTEC ALLERGY) 10 MG TABLET    Take 1 tablet by mouth daily.   DIVALPROEX (DEPAKOTE ER) 500 MG 24 HR TABLET    TAKE 2 TABLETS BY MOUTH EVERY EVENING.   ESCITALOPRAM (LEXAPRO) 20 MG TABLET    Take 1 tablet (20 mg total) by mouth daily.   LORAZEPAM (ATIVAN) 0.5 MG TABLET    TAKE 1  TABLET BY MOUTH TWICE DAILY AS NEEDED.   MELOXICAM (MOBIC) 15 MG TABLET    Take 1 tablet (15 mg total) by mouth daily as needed.   METOCLOPRAMIDE (REGLAN) 10 MG TABLET    Take 1 tablet (10 mg total) by mouth 3 (three) times daily before meals. 1/2-1 tablet three times a day as needed for headache   SIMVASTATIN (ZOCOR) 20 MG TABLET    TK 1 T PO HS   TIZANIDINE (ZANAFLEX) 4 MG TABLET    TAKE 1 TO 3 TABLETS BY MOUTH EVERY 8 HOURS AS NEEDED FOR HEADACHE. MAX OF 8 TABLETS PER DAY   TRAZODONE (DESYREL) 100 MG TABLET    Take 1 tablet (100 mg total) by mouth daily.    Patient Care Team: Lorie Phenix, MD as PCP - General (Family Medicine)     Objective:   Vitals: BP 120/70 mmHg  Pulse 76  Temp(Src) 97.9 F (36.6 C) (Oral)  Resp 16  Ht 5' 1.25" (1.556 m)  Wt 153 lb (69.4 kg)  BMI 28.66 kg/m2   Physical Exam  Constitutional: She is oriented to person, place, and time. She appears well-developed and well-nourished.  HENT:  Head: Normocephalic and atraumatic.  Right Ear: Tympanic membrane, external ear and ear canal normal.  Left Ear: Tympanic membrane, external ear and ear canal normal.  Nose: Nose normal.  Mouth/Throat: Uvula is midline, oropharynx is clear and moist and mucous membranes are normal.  Eyes: Conjunctivae, EOM and lids are normal. Pupils are equal, round, and reactive to light.  Neck: Trachea normal and normal range of motion. Neck supple. Carotid bruit is not present. No thyroid mass and no thyromegaly present.  Cardiovascular: Normal rate, regular rhythm and normal heart sounds.   Pulmonary/Chest: Effort normal and breath sounds normal.  Abdominal: Soft. Normal appearance and bowel sounds are normal. There is no hepatosplenomegaly. There is no tenderness.  Genitourinary: No breast swelling, tenderness or discharge.  Musculoskeletal: Normal range of motion.  Lymphadenopathy:    She has no cervical adenopathy.    She has no axillary adenopathy.  Neurological: She is alert  and oriented to person, place, and time. She has normal strength. No cranial nerve deficit.  Skin: Skin is warm, dry and intact.  Psychiatric: She has a normal mood and affect. Her speech is normal and behavior is normal. Judgment and thought content normal. Cognition and memory are normal.     Depression Screen PHQ 2/9 Scores 07/12/2015  PHQ - 2 Score 0     Assessment & Plan:     Routine Health Maintenance and Physical Exam  Exercise Activities and Dietary recommendations Goals    None      Immunization History  Administered Date(s) Administered  . Td 09/15/2003  1. Annual physical exam Stable. Patient advised to continue eating healthy and exercise daily. - POCT urinalysis dipstick  2. Breast cancer screening - MM DIGITAL SCREENING BILATERAL; Future  3. Hypercholesteremia - CBC with Differential/Platelet - Comprehensive metabolic panel - Lipid Panel With LDL/HDL Ratio  4. Anxiety - TSH  5. Current tobacco use Recommend continued work on smoking cessation.    6. Migraine without aura and with status migrainosus, not intractable Recurrent. Patient referred to neurology. - Ambulatory referral to Neurology   Patient seen and examined by Dr. Leo GrosserNancy J.. Ragnar Waas, and note scribed by Liz BeachSulibeya S. Dimas, CMA.  I have reviewed the document for accuracy and completeness and I agree with above. Leo Grosser- Joliene Salvador J. Kenzleigh Sedam, MD   Lorie PhenixNancy Koreen Lizaola, MD   --------------------------------------------------------------------

## 2015-07-12 NOTE — Patient Instructions (Signed)
Please call the [facility] at [facility] to schedule this at [phone]   

## 2015-07-14 LAB — URINALYSIS, MICROSCOPIC ONLY
CASTS: NONE SEEN /LPF
WBC, UA: NONE SEEN /hpf (ref 0–?)

## 2015-07-18 ENCOUNTER — Other Ambulatory Visit: Payer: Self-pay | Admitting: Family Medicine

## 2015-07-18 ENCOUNTER — Telehealth: Payer: Self-pay | Admitting: Family Medicine

## 2015-07-18 DIAGNOSIS — F419 Anxiety disorder, unspecified: Secondary | ICD-10-CM

## 2015-07-18 DIAGNOSIS — G43001 Migraine without aura, not intractable, with status migrainosus: Secondary | ICD-10-CM

## 2015-07-18 NOTE — Telephone Encounter (Signed)
Pt called saying she talked to insurance about the Botox injections for migraines.  They told her they would pay if you (Dr. Elease HashimotoMaloney) did a precert for the botox.    Pt;s cll back is [phone]  Thanks Barth Kirkseri

## 2015-07-18 NOTE — Telephone Encounter (Signed)
Printed, please fax or call in to pharmacy. Thank you.   

## 2015-07-19 ENCOUNTER — Telehealth: Payer: Self-pay

## 2015-07-19 LAB — COMPREHENSIVE METABOLIC PANEL
ALT: 10 IU/L (ref 0–32)
AST: 10 IU/L (ref 0–40)
Albumin/Globulin Ratio: 2.2 (ref 1.2–2.2)
Albumin: 4.4 g/dL (ref 3.6–4.8)
Alkaline Phosphatase: 102 IU/L (ref 39–117)
BILIRUBIN TOTAL: 0.6 mg/dL (ref 0.0–1.2)
BUN / CREAT RATIO: 13 (ref 12–28)
BUN: 8 mg/dL (ref 8–27)
CO2: 24 mmol/L (ref 18–29)
Calcium: 9.2 mg/dL (ref 8.7–10.3)
Chloride: 102 mmol/L (ref 96–106)
Creatinine, Ser: 0.62 mg/dL (ref 0.57–1.00)
GFR, EST AFRICAN AMERICAN: 111 mL/min/{1.73_m2} (ref >59)
GFR, EST NON AFRICAN AMERICAN: 96 mL/min/{1.73_m2} (ref >59)
GLUCOSE: 87 mg/dL (ref 65–99)
Globulin, Total: 2 g/dL (ref 1.5–4.5)
Potassium: 4.6 mmol/L (ref 3.5–5.2)
Sodium: 143 mmol/L (ref 134–144)
TOTAL PROTEIN: 6.4 g/dL (ref 6.0–8.5)

## 2015-07-19 LAB — CBC WITH DIFFERENTIAL/PLATELET
BASOS ABS: 0.1 10*3/uL (ref 0.0–0.2)
Basos: 1 %
EOS (ABSOLUTE): 0.2 10*3/uL (ref 0.0–0.4)
Eos: 3 %
Hematocrit: 38.5 % (ref 34.0–46.6)
Hemoglobin: 12.5 g/dL (ref 11.1–15.9)
IMMATURE GRANS (ABS): 0 10*3/uL (ref 0.0–0.1)
Immature Granulocytes: 0 %
LYMPHS: 26 %
Lymphocytes Absolute: 1.7 10*3/uL (ref 0.7–3.1)
MCH: 31.5 pg (ref 26.6–33.0)
MCHC: 32.5 g/dL (ref 31.5–35.7)
MCV: 97 fL (ref 79–97)
Monocytes Absolute: 0.7 10*3/uL (ref 0.1–0.9)
Monocytes: 11 %
NEUTROS ABS: 3.8 10*3/uL (ref 1.4–7.0)
NEUTROS PCT: 59 %
PLATELETS: 291 10*3/uL (ref 150–379)
RBC: 3.97 x10E6/uL (ref 3.77–5.28)
RDW: 13.9 % (ref 12.3–15.4)
WBC: 6.5 10*3/uL (ref 3.4–10.8)

## 2015-07-19 LAB — LIPID PANEL WITH LDL/HDL RATIO
Cholesterol, Total: 183 mg/dL (ref 100–199)
HDL: 57 mg/dL (ref >39)
LDL Calculated: 83 mg/dL (ref 0–99)
LDL/HDL RATIO: 1.5 ratio (ref 0.0–3.2)
Triglycerides: 213 mg/dL — ABNORMAL HIGH (ref 0–149)
VLDL CHOLESTEROL CAL: 43 mg/dL — AB (ref 5–40)

## 2015-07-19 LAB — TSH: TSH: 3.22 u[IU]/mL (ref 0.450–4.500)

## 2015-07-19 NOTE — Telephone Encounter (Signed)
Pt advised.   Thanks,   -Nkenge Sonntag  

## 2015-07-19 NOTE — Telephone Encounter (Signed)
-----   Message from Lorie PhenixNancy Maloney, MD sent at 07/19/2015  7:18 AM EDT ----- Labs stable. Please notify patient. Thanks.

## 2015-07-28 ENCOUNTER — Ambulatory Visit
Admission: RE | Admit: 2015-07-28 | Discharge: 2015-07-28 | Disposition: A | Discharge status: Home / Self Care | Payer: BLUE CROSS/BLUE SHIELD | Source: Ambulatory Visit | Attending: Family Medicine | Admitting: Family Medicine

## 2015-07-28 DIAGNOSIS — Z1231 Encounter for screening mammogram for malignant neoplasm of breast: Secondary | ICD-10-CM | POA: Insufficient documentation

## 2015-07-28 DIAGNOSIS — Z1239 Encounter for other screening for malignant neoplasm of breast: Secondary | ICD-10-CM

## 2015-09-07 ENCOUNTER — Ambulatory Visit: Payer: BLUE CROSS/BLUE SHIELD | Admitting: Family Medicine

## 2015-10-10 NOTE — Telephone Encounter (Signed)
error 

## 2015-10-23 ENCOUNTER — Other Ambulatory Visit: Payer: Self-pay | Admitting: Family Medicine

## 2015-10-23 NOTE — Telephone Encounter (Signed)
Pt called back to see if RX was ready. Thanks TNP

## 2015-10-23 NOTE — Telephone Encounter (Signed)
Pt contacted office for refill request on the following medications: HYDROcodone-homatropine (HYCODAN) 5-1.5 MG/5ML syrup  This was a Dr. Elease HashimotoMaloney pt and she has been coughing. Pt stated that Dr. Elease HashimotoMaloney wrote this for her in the past and she would like to pick up an RX today if possible. I advised that she may need an appt to see Dr. Sherrie MustacheFisher. Please advise. Thanks TNP

## 2015-10-23 NOTE — Telephone Encounter (Signed)
Please advise 

## 2015-10-24 ENCOUNTER — Ambulatory Visit (INDEPENDENT_AMBULATORY_CARE_PROVIDER_SITE_OTHER): Payer: BLUE CROSS/BLUE SHIELD | Admitting: Family Medicine

## 2015-10-24 ENCOUNTER — Encounter: Payer: Self-pay | Admitting: Family Medicine

## 2015-10-24 VITALS — BP 92/70 | HR 76 | Temp 97.8°F | Resp 16 | Wt 150.0 lb

## 2015-10-24 DIAGNOSIS — B349 Viral infection, unspecified: Secondary | ICD-10-CM

## 2015-10-24 MED ORDER — HYDROCODONE-HOMATROPINE 5-1.5 MG/5ML PO SYRP: ORAL_SOLUTION | ORAL | 0 refills | Status: DC

## 2015-10-24 MED ORDER — DOXYCYCLINE HYCLATE 100 MG PO TABS: 100.0000 mg | ORAL_TABLET | Freq: Two times a day (BID) | ORAL | 0 refills | Status: DC

## 2015-10-24 NOTE — Telephone Encounter (Signed)
Pt advised; made apt to see Nadine CountsBob today at 3pm.   Thanks,   -Vernona RiegerLaura

## 2015-10-24 NOTE — Patient Instructions (Addendum)
Discussed use of Mucinex D and Delsym for cough. Schedule your inhaler twice daily while ill.

## 2015-10-24 NOTE — Progress Notes (Signed)
Subjective:     Patient ID: Kerman PasseyDebra H Aikey, female   DOB: 01/24/1951, 65 y.o.   MRN: [id number]  HPI  Chief Complaint  Patient presents with  . URI    Patient comes in office today with complaints of cough and congestion for the past 4 days. Patient states that her cough is productive green/yellowish like phlegm, sinus pain and pressure, migraine headaches, and ear pain. Patient has tried taking otc Mucinex, Nightquil, Tylenol and allergy medication for relief.   States she was exposed to sick grandchildren. [place] Denies specific tick exposure. Has continued to smoke while ill and has used her inhaler on one occasion.   Review of Systems     Objective:   Physical Exam  Constitutional: She appears well-developed and well-nourished. No distress.  Ears: T.M's intact without inflammation Throat: no tonsillar enlargement or exudate Neck: no cervical adenopathy Lungs: clear after cough     Assessment:    1. Viral syndrome: consider tick fever/COPD exacerbation. - doxycycline (VIBRA-TABS) 100 MG tablet; Take 1 tablet (100 mg total) by mouth 2 (two) times daily.  Dispense: 20 tablet; Refill: 0 - HYDROcodone-homatropine (HYCODAN) 5-1.5 MG/5ML syrup; 5 ml 4-6 hours as needed for cough  Dispense: 240 mL; Refill: 0    Plan:    Discussed use of otc medication and scheduling albuterol MDI while ill.

## 2015-10-24 NOTE — Telephone Encounter (Signed)
This is a schedule II medication and cannot be prescribed without being seen and examined and documenting medical necessity. If she has fever or shortness of breath she needs office visit. Otherwise she should try OTC Robitussin DM

## 2015-10-24 NOTE — Telephone Encounter (Signed)
LMOVM for pt to return call 

## 2016-01-30 ENCOUNTER — Other Ambulatory Visit: Payer: Self-pay | Admitting: Family Medicine

## 2016-01-30 DIAGNOSIS — F419 Anxiety disorder, unspecified: Secondary | ICD-10-CM

## 2016-01-30 DIAGNOSIS — E7800 Pure hypercholesterolemia, unspecified: Secondary | ICD-10-CM

## 2016-01-30 NOTE — Telephone Encounter (Signed)
Is Dr. Santiago BurMaloney's pt. You are the only other provider pt has seen. Was an acute visit in August. No FU scheduled. Allene DillonEmily Drozdowski, CMA

## 2016-03-01 ENCOUNTER — Encounter: Payer: Self-pay | Admitting: Family Medicine

## 2016-03-01 ENCOUNTER — Ambulatory Visit (INDEPENDENT_AMBULATORY_CARE_PROVIDER_SITE_OTHER): Payer: BLUE CROSS/BLUE SHIELD | Admitting: Family Medicine

## 2016-03-01 VITALS — BP 100/64 | HR 64 | Temp 97.0°F | Wt 147.6 lb

## 2016-03-01 DIAGNOSIS — J0100 Acute maxillary sinusitis, unspecified: Secondary | ICD-10-CM

## 2016-03-01 MED ORDER — AMOXICILLIN-POT CLAVULANATE 875-125 MG PO TABS
1.0000 | ORAL_TABLET | Freq: Two times a day (BID) | ORAL | 0 refills | Status: DC
Start: 2016-03-01 — End: 2016-05-26

## 2016-03-01 NOTE — Progress Notes (Signed)
Subjective:     Patient ID: Melissa Buck, female   DOB: 08/17/1950, 65 y.o.   MRN: [id number]  HPI  Chief Complaint  Patient presents with  . URI    Patient complains of headache, bilateral ear pain, head congestion X 2 weeks. Patient has been taking Mucinex for symptoms with mild relief.  Patient reports increased sinus pressure, purulent sinus drainage, post nasal drainage and accompanying cough. Has been using Mucinex D, Mucinex DM, and Delsym for her sx.   Review of Systems     Objective:   Physical Exam  Constitutional: She appears well-developed and well-nourished.  Ears: T.M's intact without inflammation Sinuses: mild maxillary sinus tenderness Throat:tonsils absent Neck: bilateral anterior cervical nodes Lungs: clear     Assessment:    1. Acute maxillary sinusitis, recurrence not specified - amoxicillin-clavulanate (AUGMENTIN) 875-125 MG tablet; Take 1 tablet by mouth 2 (two) times daily.  Dispense: 20 tablet; Refill: 0    Plan:    Continue current otc products. Minimize smoking.

## 2016-03-01 NOTE — Patient Instructions (Signed)
Continue the over the counter products you are already using and minimize smoking while ill.

## 2016-03-30 ENCOUNTER — Other Ambulatory Visit: Payer: Self-pay | Admitting: Family Medicine

## 2016-03-30 DIAGNOSIS — F419 Anxiety disorder, unspecified: Secondary | ICD-10-CM

## 2016-04-19 ENCOUNTER — Other Ambulatory Visit: Payer: Self-pay | Admitting: Family Medicine

## 2016-04-19 MED ORDER — TIZANIDINE HCL 4 MG PO TABS: ORAL_TABLET | ORAL | 5 refills | Status: DC

## 2016-05-15 ENCOUNTER — Other Ambulatory Visit: Payer: Self-pay | Admitting: Family Medicine

## 2016-05-15 DIAGNOSIS — F419 Anxiety disorder, unspecified: Secondary | ICD-10-CM

## 2016-05-15 MED ORDER — ESCITALOPRAM OXALATE 20 MG PO TABS: 20.0000 mg | ORAL_TABLET | Freq: Every day | ORAL | 1 refills | Status: DC

## 2016-05-26 ENCOUNTER — Emergency Department
Admission: EM | Admit: 2016-05-26 | Discharge: 2016-05-26 | Disposition: A | Discharge status: Home / Self Care | Payer: BLUE CROSS/BLUE SHIELD | Attending: Emergency Medicine | Admitting: Emergency Medicine

## 2016-05-26 ENCOUNTER — Emergency Department: Payer: BLUE CROSS/BLUE SHIELD

## 2016-05-26 DIAGNOSIS — F17210 Nicotine dependence, cigarettes, uncomplicated: Secondary | ICD-10-CM | POA: Diagnosis not present

## 2016-05-26 DIAGNOSIS — R252 Cramp and spasm: Secondary | ICD-10-CM | POA: Diagnosis not present

## 2016-05-26 DIAGNOSIS — R079 Chest pain, unspecified: Secondary | ICD-10-CM | POA: Diagnosis present

## 2016-05-26 DIAGNOSIS — Z79899 Other long term (current) drug therapy: Secondary | ICD-10-CM | POA: Diagnosis not present

## 2016-05-26 DIAGNOSIS — J449 Chronic obstructive pulmonary disease, unspecified: Secondary | ICD-10-CM | POA: Diagnosis not present

## 2016-05-26 DIAGNOSIS — J45909 Unspecified asthma, uncomplicated: Secondary | ICD-10-CM | POA: Diagnosis not present

## 2016-05-26 LAB — CBC
HCT: 36.6 % (ref 35.0–47.0)
Hemoglobin: 12.5 g/dL (ref 12.0–16.0)
MCH: 31.8 pg (ref 26.0–34.0)
MCHC: 34.3 g/dL (ref 32.0–36.0)
MCV: 92.9 fL (ref 80.0–100.0)
PLATELETS: 318 10*3/uL (ref 150–440)
RBC: 3.94 MIL/uL (ref 3.80–5.20)
RDW: 14.5 % (ref 11.5–14.5)
WBC: 8.3 10*3/uL (ref 3.6–11.0)

## 2016-05-26 LAB — ETHANOL: Alcohol, Ethyl (B): 48 mg/dL — ABNORMAL HIGH (ref <5)

## 2016-05-26 LAB — COMPREHENSIVE METABOLIC PANEL
ALT: 14 U/L (ref 14–54)
AST: 19 U/L (ref 15–41)
Albumin: 3.9 g/dL (ref 3.5–5.0)
Alkaline Phosphatase: 89 U/L (ref 38–126)
Anion gap: 10 (ref 5–15)
BUN: 8 mg/dL (ref 6–20)
CHLORIDE: 106 mmol/L (ref 101–111)
CO2: 23 mmol/L (ref 22–32)
CREATININE: 0.81 mg/dL (ref 0.44–1.00)
Calcium: 8.4 mg/dL — ABNORMAL LOW (ref 8.9–10.3)
GFR calc Af Amer: >60 mL/min (ref >60)
Glucose, Bld: 127 mg/dL — ABNORMAL HIGH (ref 65–99)
POTASSIUM: 3.2 mmol/L — AB (ref 3.5–5.1)
SODIUM: 139 mmol/L (ref 135–145)
Total Bilirubin: 0.6 mg/dL (ref 0.3–1.2)
Total Protein: 6.5 g/dL (ref 6.5–8.1)

## 2016-05-26 LAB — MAGNESIUM: Magnesium: 2 mg/dL (ref 1.7–2.4)

## 2016-05-26 LAB — CK: CK TOTAL: 74 U/L (ref 38–234)

## 2016-05-26 LAB — TROPONIN I

## 2016-05-26 MED ORDER — DIAZEPAM 5 MG PO TABS
5.0000 mg | ORAL_TABLET | Freq: Once | ORAL | Status: AC
  Administered 2016-05-26: 5 mg via ORAL
  Filled 2016-05-26: qty 1

## 2016-05-26 MED ORDER — SODIUM CHLORIDE 0.9 % IV SOLN
1.0000 g | Freq: Once | INTRAVENOUS | Status: DC
  Filled 2016-05-26: qty 10

## 2016-05-26 MED ORDER — SODIUM CHLORIDE 0.9 % IV SOLN
1.0000 g | Freq: Once | INTRAVENOUS | Status: DC
Start: 2016-05-26 — End: 2016-05-26
  Filled 2016-05-26: qty 10

## 2016-05-26 MED ORDER — POTASSIUM CHLORIDE 20 MEQ/15ML (10%) PO SOLN
40.0000 meq | Freq: Once | ORAL | Status: AC
  Administered 2016-05-26: 40 meq via ORAL
  Filled 2016-05-26: qty 30

## 2016-05-26 MED ORDER — DIAZEPAM 5 MG PO TABS: 5.0000 mg | ORAL_TABLET | Freq: Three times a day (TID) | ORAL | 0 refills | Status: DC | PRN

## 2016-05-26 MED ORDER — LORAZEPAM 2 MG/ML IJ SOLN
1.0000 mg | Freq: Once | INTRAMUSCULAR | Status: AC
  Administered 2016-05-26: 1 mg via INTRAVENOUS
  Filled 2016-05-26: qty 1

## 2016-05-26 MED ORDER — SODIUM CHLORIDE 0.9 % IV SOLN
1.0000 g | Freq: Once | INTRAVENOUS | Status: AC
  Administered 2016-05-26: 1 g via INTRAVENOUS
  Filled 2016-05-26: qty 10

## 2016-05-26 MED ORDER — POTASSIUM CHLORIDE IN NACL 20-0.9 MEQ/L-% IV SOLN
Freq: Once | INTRAVENOUS | Status: AC
  Administered 2016-05-26: 19:00:00 via INTRAVENOUS
  Filled 2016-05-26: qty 1000

## 2016-05-26 NOTE — ED Notes (Signed)
Jacki ConesLaurie, RN called lab to add on CK

## 2016-05-26 NOTE — ED Provider Notes (Signed)
[facility] Emergency Department Provider Note   ____________________________________________   First MD Initiated Contact with Patient 05/26/16 1640     (approximate)  I have reviewed the triage vital signs and the nursing notes.   HISTORY  Chief Complaint Chest Pain    HPI Melissa Buck is a 66 y.o. female who reports cramping and and spasm of her hands bilaterally she also complains of twitching in her muscles in her face. Her mouth was tight in her muscles throughout her body including her chest or tight. She does not have any spasm of the face when the facial nerve is intact. She reports she's never had this kind of spasming before.  Past Medical History:  Diagnosis Date  . Asthma   . Colitis   . COPD (chronic obstructive pulmonary disease) [facility])     Patient Active Problem List   Diagnosis Date Noted  . Cough 05/10/2015  . Bronchitis 05/10/2015  . Fever 05/10/2015  . Abdominal pain, right upper quadrant 03/10/2015  . Dermatitis, eczematoid 03/10/2015  . D (diarrhea) 03/10/2015  . Difficulty hearing 03/10/2015  . Hypercholesteremia 03/10/2015  . Headache, migraine 03/10/2015  . Anxiety 11/14/2014  . Migraine 08/22/2014  . DD (diverticular disease) 09/11/2006  . CAFL (chronic airflow limitation) (HCC) 07/01/2006  . Adaptation reaction 03/19/2002  . Allergic rhinitis 03/19/2002  . Cannot sleep 03/19/2002  . Current tobacco use 03/19/2002    Past Surgical History:  Procedure Laterality Date  . ABDOMINAL HYSTERECTOMY    . COLONOSCOPY      Prior to Admission medications   Medication Sig Start Date End Date Taking? Authorizing Provider  albuterol (PROVENTIL HFA;VENTOLIN HFA) 108 (90 Base) MCG/ACT inhaler Inhale 2 puffs into the lungs every 6 (six) hours as needed for wheezing or shortness of breath. 05/10/15  Yes Lorie Phenix, MD  escitalopram (LEXAPRO) 20 MG tablet Take 1 tablet (20 mg total) by mouth daily. 05/15/16  Yes Anola Gurney, PA  fexofenadine (ALLEGRA) 180 MG tablet Take 180 mg by mouth daily.   Yes Historical Provider, MD  LORazepam (ATIVAN) 0.5 MG tablet TAKE 1 TABLET BY MOUTH TWICE DAILY AS NEEDED. 07/18/15  Yes Lorie Phenix, MD  meloxicam (MOBIC) 15 MG tablet Take 1 tablet (15 mg total) by mouth daily as needed. 03/10/15  Yes Lorie Phenix, MD  metoCLOPramide (REGLAN) 10 MG tablet Take 1 tablet (10 mg total) by mouth 3 (three) times daily before meals. 1/2-1 tablet three times a day as needed for headache 03/10/15  Yes Lorie Phenix, MD  simvastatin (ZOCOR) 20 MG tablet TAKE 1 TABLET BY MOUTH AT BEDTIME 01/30/16  Yes Anola Gurney, PA  tiZANidine (ZANAFLEX) 4 MG tablet TAKE 1 TO 3 TABLETS BY MOUTH EVERY 8 HOURS AS NEEDED FOR HEADACHE. MAX OF 8 TABLETS PER DAY 04/19/16  Yes Anola Gurney, PA  traZODone (DESYREL) 100 MG tablet Take 1 tablet (100 mg total) by mouth daily. 03/10/15  Yes Lorie Phenix, MD  divalproex (DEPAKOTE ER) 500 MG 24 hr tablet TAKE 2 TABLETS BY MOUTH EVERY EVENING. Patient not taking: Reported on 05/26/2016 03/10/15   Lorie Phenix, MD    Allergies Aspirin; Erythromycin; Hydrocodone-acetaminophen; and Sulfa antibiotics  Family History  Problem Relation Age of Onset  . Healthy Sister   . Heart disease Brother   . Breast cancer Maternal Aunt     Social History Social History  Substance Use Topics  . Smoking status: Current Some Day Smoker    Packs/day: 0.50  Types: Cigarettes  . Smokeless tobacco: Never Used  . Alcohol use 0.6 oz/week    1 Glasses of wine per week    Review of Systems Constitutional: No fever/chills Eyes: No visual changes. ENT: No sore throat. Cardiovascular: Chest muscle tightness Respiratory: Denies shortness of breath. Gastrointestinal: No abdominal pain.  No nausea, no vomiting.  No diarrhea.  No constipation. Genitourinary: Negative for dysuria. Musculoskeletal: Negative for back pain. Skin: Negative for rash. Neurological: Negative for headaches,  focal weakness or numbness.  10-point ROS otherwise negative.  ____________________________________________   PHYSICAL EXAM:  VITAL SIGNS: ED Triage Vitals  Enc Vitals Group     BP 05/26/16 1629 (!) 123/56     Pulse Rate 05/26/16 1629 79     Resp 05/26/16 1629 18     Temp 05/26/16 1629 98.6 F (37 C)     Temp Source 05/26/16 1629 Oral     SpO2 05/26/16 1629 96 %     Weight 05/26/16 1630 140 lb (63.5 kg)     Height 05/26/16 1630 5\' 1"  (1.549 m)     Head Circumference --      Peak Flow --      Pain Score 05/26/16 1630 9     Pain Loc --      Pain Edu? --      Excl. in GC? --     Constitutional: Alert and oriented.  Eyes: Conjunctivae are normal. PERRL. EOMI. Head: Atraumatic. Nose: No congestion/rhinnorhea. Mouth/Throat: Mucous membranes are moist.  Oropharynx non-erythematous. Neck: No stridor.   Cardiovascular: Normal rate, regular rhythm. Grossly normal heart sounds.  Good peripheral circulation. Respiratory: Normal respiratory effort.  No retractions. Lungs CTAB. Gastrointestinal: Soft and nontender. No distention. No abdominal bruits. No CVA tenderness. Musculoskeletal: Muscles are tight and crampy. Patient has carpal spasm. She does not have any spasm of the facial muscles on tapping of the facial nerve. Neurologic:  Normal speech and language. No gross focal neurologic deficits are appreciated.  Skin:  Skin is warm, dry and intact. No rash noted.   ____________________________________________   LABS (all labs ordered are listed, but only abnormal results are displayed)  Labs Reviewed  COMPREHENSIVE METABOLIC PANEL - Abnormal; Notable for the following:       Result Value   Potassium 3.2 (*)    Glucose, Bld 127 (*)    Calcium 8.4 (*)    All other components within normal limits  ETHANOL - Abnormal; Notable for the following:    Alcohol, Ethyl (B) 48 (*)    All other components within normal limits  CBC  TROPONIN I  MAGNESIUM  TROPONIN I  CK    ____________________________________________  EKG  EKG read and interpreted by me shows normal sinus rhythm rate of 78 normal axis essentially normal EKG ____________________________________________  RADIOLOGY  Study Result   CLINICAL DATA:  Chest pain  EXAM: PORTABLE CHEST 1 VIEW  COMPARISON:  05/10/2015  FINDINGS: Cardiomediastinal silhouette is stable. No infiltrate or pleural effusion. No pulmonary edema. Bony thorax is unremarkable.  IMPRESSION: No active disease.   Electronically Signed   By: Natasha MeadLiviu  Pop M.D.   On: 05/26/2016 17:10    ____________________________________________   PROCEDURES  Procedure(s) performed:  Procedures  Critical Care performed:   ____________________________________________   INITIAL IMPRESSION / ASSESSMENT AND PLAN / ED COURSE  Pertinent labs & imaging results that were available during my care of the patient were reviewed by me and considered in my medical decision making (see chart for details).  -----------------------------------------  8:56 PM on 05/26/2016 -----------------------------------------  Patient's cramps improve after the Valium by mouth not after the Ativan. They're even better after electrolyte replacement.      ____________________________________________   FINAL CLINICAL IMPRESSION(S) / ED DIAGNOSES  Final diagnoses:  Muscle cramps      NEW MEDICATIONS STARTED DURING THIS VISIT:  New Prescriptions   No medications on file     Note:  This document was prepared using Dragon voice recognition software and may include unintentional dictation errors.    Arnaldo Natal, MD 05/26/16 [id number]

## 2016-05-26 NOTE — Discharge Instructions (Signed)
We have given you calcium and potassium 3 your levels and that should be better. If you begin having some spasms again I will give you some Valium he can take 1 pill 3 times a day as needed but do not take it with the Ativan. That may be too much and you might stop breathing. Do not drive on the Valium. Please return for any further problems.

## 2016-05-26 NOTE — ED Triage Notes (Addendum)
Pt states that she started yesterday with "drawing" in her hands bilat, pt states that this has never happened before, pt states that it cont when she woke up this am and reports that she drank 2 glasses of wine to see if it would help. Pt is tearful and feels like the muscles in her mouth are tight and clenching as well as the muscles throughout her body, pt also stated chest tightness

## 2016-07-15 ENCOUNTER — Ambulatory Visit (INDEPENDENT_AMBULATORY_CARE_PROVIDER_SITE_OTHER): Payer: BLUE CROSS/BLUE SHIELD | Admitting: Physician Assistant

## 2016-07-15 ENCOUNTER — Encounter: Payer: Self-pay | Admitting: Physician Assistant

## 2016-07-15 VITALS — BP 112/68 | HR 80 | Temp 98.0°F | Resp 16 | Ht 61.5 in | Wt 144.0 lb

## 2016-07-15 DIAGNOSIS — J309 Allergic rhinitis, unspecified: Secondary | ICD-10-CM | POA: Diagnosis not present

## 2016-07-15 DIAGNOSIS — J449 Chronic obstructive pulmonary disease, unspecified: Secondary | ICD-10-CM | POA: Diagnosis not present

## 2016-07-15 DIAGNOSIS — Z1239 Encounter for other screening for malignant neoplasm of breast: Secondary | ICD-10-CM

## 2016-07-15 DIAGNOSIS — Z720 Tobacco use: Secondary | ICD-10-CM | POA: Diagnosis not present

## 2016-07-15 DIAGNOSIS — E7800 Pure hypercholesterolemia, unspecified: Secondary | ICD-10-CM

## 2016-07-15 DIAGNOSIS — F419 Anxiety disorder, unspecified: Secondary | ICD-10-CM | POA: Diagnosis not present

## 2016-07-15 DIAGNOSIS — Z1231 Encounter for screening mammogram for malignant neoplasm of breast: Secondary | ICD-10-CM | POA: Diagnosis not present

## 2016-07-15 DIAGNOSIS — Z0000 Encounter for general adult medical examination without abnormal findings: Secondary | ICD-10-CM | POA: Diagnosis not present

## 2016-07-15 DIAGNOSIS — Z23 Encounter for immunization: Secondary | ICD-10-CM | POA: Diagnosis not present

## 2016-07-15 DIAGNOSIS — G43001 Migraine without aura, not intractable, with status migrainosus: Secondary | ICD-10-CM

## 2016-07-15 DIAGNOSIS — Z131 Encounter for screening for diabetes mellitus: Secondary | ICD-10-CM

## 2016-07-15 MED ORDER — FLUTICASONE PROPIONATE 50 MCG/ACT NA SUSP: 2.0000 | Freq: Every day | NASAL | 6 refills | Status: DC

## 2016-07-15 MED ORDER — FEXOFENADINE HCL 180 MG PO TABS: 180.0000 mg | ORAL_TABLET | Freq: Every day | ORAL | 1 refills | Status: DC

## 2016-07-15 NOTE — Progress Notes (Signed)
Patient: Melissa Buck, Female    DOB: 11/27/1950, 66 y.o.   MRN: [id number] Visit Date: 07/15/2016  Today's Provider: Trey SailorsAdriana M Pollak, PA-C   Chief Complaint  Patient presents with  . Annual Exam   Subjective:    Annual physical exam Melissa Buck is a 66 y.o. female who presents today for health maintenance and complete physical. She feels fairly well.  She would like to be referred ENT secondary to allergies.   She reports exercising regularly. She reports she is sleeping fairly well.  Colonoscopy 2015: hyperplastic polyps - repeat 2025 Mammogram 07/2015: normal PAP: hysterectomy 20+ years ago, ovaries remain DEXA: done remotely, patient reports normal.   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 The work is draining for her. She takes Ativan to help with the stress, mostly when she has hospital shifts. Also taking Lexapro 20 mg.  She is living in [place] alone with her cat and her dog. She is currently restarting relationship with her ex-husband. She has three kids and three grandkids. She is currently smoking six cigarettes per day. She has smoked 1/2 pack daily for 49 years.  She has a history of migraines. She [facility] Welton FlakesKhan [facility] [place] many years ago. She reports trying many medications. Most recently, she was on Depakote ER 500 mg daily for prophylaxis which she self discontinued within the past year 2/2 memory issues and confusion. She currently uses Mobic 15 mg, Reglan, and Zanaflex for her migraines PRN. This month she reports 10 migraines.    Allergic Rhinitis  Patient reports sinus and nasal congestion, itchy water eyes. Dog and cat sleep in the room with her. She says Joyce Copallegra has worked well in the past. Not taking flonase currently but had success with it.  -----------------------------------------------------------------   Review of Systems  Constitutional: Negative.   HENT: Negative.   Eyes:  Negative.   Respiratory: Negative.   Cardiovascular: Negative.   Genitourinary: Negative.   Musculoskeletal: Negative.   Skin: Negative.   Allergic/Immunologic: Positive for environmental allergies.  Neurological: Negative.   Hematological: Negative.   Psychiatric/Behavioral: Negative.     Social History      She  reports that she has been smoking Cigarettes.  She has been smoking about 0.50 packs per day. She has never used smokeless tobacco. She reports that she drinks about 0.6 oz of alcohol per week . She reports that she does not use drugs.       Social History   Social History  . Marital status: Divorced    Spouse name: N/A  . Number of children: N/A  . Years of education: N/A   Social History Main Topics  . Smoking status: Current Some Day Smoker    Packs/day: 0.50    Types: Cigarettes  . Smokeless tobacco: Never Used  . Alcohol use 0.6 oz/week    1 Glasses of wine per week  . Drug use: No  . Sexual activity: Not Asked   Other Topics Concern  . None   Social History Narrative  . None    Past Medical History:  Diagnosis Date  . Asthma   . Colitis   . COPD (chronic obstructive pulmonary disease) [facility](HCC)      Patient Active Problem List   Diagnosis Date Noted  . Cough 05/10/2015  . Bronchitis 05/10/2015  . Fever 05/10/2015  . Abdominal pain, right upper quadrant 03/10/2015  .  Dermatitis, eczematoid 03/10/2015  . D (diarrhea) 03/10/2015  . Difficulty hearing 03/10/2015  . Hypercholesteremia 03/10/2015  . Headache, migraine 03/10/2015  . Anxiety 11/14/2014  . Migraine 08/22/2014  . DD (diverticular disease) 09/11/2006  . CAFL (chronic airflow limitation) (HCC) 07/01/2006  . Adaptation reaction 03/19/2002  . Allergic rhinitis 03/19/2002  . Cannot sleep 03/19/2002  . Current tobacco use 03/19/2002    Past Surgical History:  Procedure Laterality Date  . ABDOMINAL HYSTERECTOMY    . COLONOSCOPY      Family History        Family Status  Relation  Status  . Sister Alive  . Brother Deceased at age 97       heart attack  . Mother Deceased at age 31  . Father Other  . Mat Aunt (Not Specified)        Her family history includes Breast cancer in her maternal aunt; Healthy in her sister; Heart disease in her brother.     Allergies  Allergen Reactions  . Aspirin   . Erythromycin     Allergic to Mycins.  . Hydrocodone-Acetaminophen Itching    GI Upset  . Sulfa Antibiotics     Throat closes up     Current Outpatient Prescriptions:  .  albuterol (PROVENTIL HFA;VENTOLIN HFA) 108 (90 Base) MCG/ACT inhaler, Inhale 2 puffs into the lungs every 6 (six) hours as needed for wheezing or shortness of breath., Disp: 1 Inhaler, Rfl: 2 .  diazepam (VALIUM) 5 MG tablet, Take 1 tablet (5 mg total) by mouth every 8 (eight) hours as needed for anxiety., Disp: 5 tablet, Rfl: 0 .  escitalopram (LEXAPRO) 20 MG tablet, Take 1 tablet (20 mg total) by mouth daily., Disp: 90 tablet, Rfl: 1 .  fexofenadine (ALLEGRA) 180 MG tablet, Take 180 mg by mouth daily., Disp: , Rfl:  .  LORazepam (ATIVAN) 0.5 MG tablet, TAKE 1 TABLET BY MOUTH TWICE DAILY AS NEEDED., Disp: 60 tablet, Rfl: 5 .  meloxicam (MOBIC) 15 MG tablet, Take 1 tablet (15 mg total) by mouth daily as needed., Disp: 30 tablet, Rfl: 5 .  metoCLOPramide (REGLAN) 10 MG tablet, Take 1 tablet (10 mg total) by mouth 3 (three) times daily before meals. 1/2-1 tablet three times a day as needed for headache, Disp: 30 tablet, Rfl: 5 .  simvastatin (ZOCOR) 20 MG tablet, TAKE 1 TABLET BY MOUTH AT BEDTIME, Disp: 90 tablet, Rfl: 1 .  tiZANidine (ZANAFLEX) 4 MG tablet, TAKE 1 TO 3 TABLETS BY MOUTH EVERY 8 HOURS AS NEEDED FOR HEADACHE. MAX OF 8 TABLETS PER DAY, Disp: 90 tablet, Rfl: 5 .  traZODone (DESYREL) 100 MG tablet, Take 1 tablet (100 mg total) by mouth daily., Disp: 30 tablet, Rfl: 5 .  divalproex (DEPAKOTE ER) 500 MG 24 hr tablet, TAKE 2 TABLETS BY MOUTH EVERY EVENING. (Patient not taking: Reported on  05/26/2016), Disp: 60 tablet, Rfl: 5   Patient Care Team: Maryella Shivers as PCP - General (Physician Assistant)      Objective:   Vitals: BP 112/68 (BP Location: Left Arm, Patient Position: Sitting, Cuff Size: Normal)   Pulse 80   Temp 98 F (36.7 C) (Oral)   Resp 16   Ht 5' 1.5" (1.562 m)   Wt 144 lb (65.3 kg)   BMI 26.77 kg/m    Vitals:   07/15/16 1507  BP: 112/68  Pulse: 80  Resp: 16  Temp: 98 F (36.7 C)  TempSrc: Oral  Weight: 144 lb (65.3 kg)  Height: 5' 1.5" (1.562 m)     Physical Exam   Depression Screen PHQ 2/9 Scores 07/15/2016 07/12/2015  PHQ - 2 Score 0 0  PHQ- 9 Score 0 -      Assessment & Plan:     Routine Health Maintenance and Physical Exam  Exercise Activities and Dietary recommendations Goals    None      Immunization History  Administered Date(s) Administered  . Td 09/15/2003    Health Maintenance  Topic Date Due  . Hepatitis C Screening  12/14/1950  . HIV Screening  01/04/1967  . PAP SMEAR  01/03/1973  . TETANUS/TDAP  09/14/2013  . INFLUENZA VACCINE  10/02/2016  . MAMMOGRAM  07/27/2017  . COLONOSCOPY  10/27/2018     Discussed health benefits of physical activity, and encouraged her to engage in regular exercise appropriate for her age and condition.    1. Annual physical exam   2. Migraine without aura and with status migrainosus, not intractable  Chronic migraines, concern for rebound headaches with near daily use of Meloxicam.   - Ambulatory referral to Neurology  3. CAFL (chronic airflow limitation) (HCC)  Counseled on smoking cessation. Uses albuterol inhaler every 2 weeks.  4. Hypercholesteremia  - Lipid panel  5. Current tobacco use  Counseled on smoking cessation. Patient interested in Bupropion. Wants to check with insurance if they cover it.   6. Allergic rhinitis, unspecified seasonality, unspecified trigger  Advised her to make sure cat and dog don't sleep in the same room as her.  -  fexofenadine (ALLEGRA) 180 MG tablet; Take 1 tablet (180 mg total) by mouth daily.  Dispense: 90 tablet; Refill: 1 - fluticasone (FLONASE) 50 MCG/ACT nasal spray; Place 2 sprays into both nostrils daily.  Dispense: 16 g; Refill: 6  7. Diabetes mellitus screening  - Comprehensive metabolic panel  8. Breast cancer screening  Patient aware of how to call [place].  - MM DIGITAL SCREENING BILATERAL; Future  9. Anxiety  She is currently on lexapro 20 mg. She takes Ativan 0.5mg  three times weekly, mostly when she has to work. I have explicitly counseled on usage of this medication to include no driving, no use of opioids, no using alcohol. She reports she is not using alcohol with this. Ethanol on recent ER visit was 48.   --------------------------------------------------------------------    Trey Sailors, PA-C  [facility]

## 2016-07-15 NOTE — Patient Instructions (Signed)
Health Maintenance, Female Adopting a healthy lifestyle and getting preventive care can go a long way to promote health and wellness. Talk with your health care provider about what schedule of regular examinations is right for you. This is a good chance for you to check in with your provider about disease prevention and staying healthy. In between checkups, there are plenty of things you can do on your own. Experts have done a lot of research about which lifestyle changes and preventive measures are most likely to keep you healthy. Ask your health care provider for more information. Weight and diet Eat a healthy diet  Be sure to include plenty of vegetables, fruits, low-fat dairy products, and lean protein.  Do not eat a lot of foods high in solid fats, added sugars, or salt.  Get regular exercise. This is one of the most important things you can do for your health.  Most adults should exercise for at least 150 minutes each week. The exercise should increase your heart rate and make you sweat (moderate-intensity exercise).  Most adults should also do strengthening exercises at least twice a week. This is in addition to the moderate-intensity exercise. Maintain a healthy weight  Body mass index (BMI) is a measurement that can be used to identify possible weight problems. It estimates body fat based on height and weight. Your health care provider can help determine your BMI and help you achieve or maintain a healthy weight.  For females 76 years of age and older:  A BMI below 18.5 is considered underweight.  A BMI of 18.5 to 24.9 is normal.  A BMI of 25 to 29.9 is considered overweight.  A BMI of 30 and above is considered obese. Watch levels of cholesterol and blood lipids  You should start having your blood tested for lipids and cholesterol at 65 years of age, then have this test every 5 years.  You may need to have your cholesterol levels checked more often if:  Your lipid or  cholesterol levels are high.  You are older than 65 years of age.  You are at high risk for heart disease. Cancer screening Lung Cancer  Lung cancer screening is recommended for adults 64-42 years old who are at high risk for lung cancer because of a history of smoking.  A yearly low-dose CT scan of the lungs is recommended for people who:  Currently smoke.  Have quit within the past 15 years.  Have at least a 30-pack-year history of smoking. A pack year is smoking an average of one pack of cigarettes a day for 1 year.  Yearly screening should continue until it has been 15 years since you quit.  Yearly screening should stop if you develop a health problem that would prevent you from having lung cancer treatment. Breast Cancer  Practice breast self-awareness. This means understanding how your breasts normally appear and feel.  It also means doing regular breast self-exams. Let your health care provider know about any changes, no matter how small.  If you are in your 20s or 30s, you should have a clinical breast exam (CBE) by a health care provider every 1-3 years as part of a regular health exam.  If you are 34 or older, have a CBE every year. Also consider having a breast X-ray (mammogram) every year.  If you have a family history of breast cancer, talk to your health care provider about genetic screening.  If you are at high risk for breast cancer, talk  to your health care provider about having an MRI and a mammogram every year.  Breast cancer gene (BRCA) assessment is recommended for women who have family members with BRCA-related cancers. BRCA-related cancers include:  Breast.  Ovarian.  Tubal.  Peritoneal cancers.  Results of the assessment will determine the need for genetic counseling and BRCA1 and BRCA2 testing. Cervical Cancer  Your health care provider may recommend that you be screened regularly for cancer of the pelvic organs (ovaries, uterus, and vagina).  This screening involves a pelvic examination, including checking for microscopic changes to the surface of your cervix (Pap test). You may be encouraged to have this screening done every 3 years, beginning at age 30.  For women ages 79-65, health care providers may recommend pelvic exams and Pap testing every 3 years, or they may recommend the Pap and pelvic exam, combined with testing for human papilloma virus (HPV), every 5 years. Some types of HPV increase your risk of cervical cancer. Testing for HPV may also be done on women of any age with unclear Pap test results.  Other health care providers may not recommend any screening for nonpregnant women who are considered low risk for pelvic cancer and who do not have symptoms. Ask your health care provider if a screening pelvic exam is right for you.  If you have had past treatment for cervical cancer or a condition that could lead to cancer, you need Pap tests and screening for cancer for at least 20 years after your treatment. If Pap tests have been discontinued, your risk factors (such as having a new sexual partner) need to be reassessed to determine if screening should resume. Some women have medical problems that increase the chance of getting cervical cancer. In these cases, your health care provider may recommend more frequent screening and Pap tests. Colorectal Cancer  This type of cancer can be detected and often prevented.  Routine colorectal cancer screening usually begins at 65 years of age and continues through 65 years of age.  Your health care provider may recommend screening at an earlier age if you have risk factors for colon cancer.  Your health care provider may also recommend using home test kits to check for hidden blood in the stool.  A small camera at the end of a tube can be used to examine your colon directly (sigmoidoscopy or colonoscopy). This is done to check for the earliest forms of colorectal cancer.  Routine  screening usually begins at age 23.  Direct examination of the colon should be repeated every 5-10 years through 65 years of age. However, you may need to be screened more often if early forms of precancerous polyps or small growths are found. Skin Cancer  Check your skin from head to toe regularly.  Tell your health care provider about any new moles or changes in moles, especially if there is a change in a mole's shape or color.  Also tell your health care provider if you have a mole that is larger than the size of a pencil eraser.  Always use sunscreen. Apply sunscreen liberally and repeatedly throughout the day.  Protect yourself by wearing long sleeves, pants, a wide-brimmed hat, and sunglasses whenever you are outside. Heart disease, diabetes, and high blood pressure  High blood pressure causes heart disease and increases the risk of stroke. High blood pressure is more likely to develop in:  People who have blood pressure in the high end of the normal range (130-139/85-89 mm Hg).  People who are overweight or obese.  People who are African American.  If you are 59-24 years of age, have your blood pressure checked every 3-5 years. If you are 34 years of age or older, have your blood pressure checked every year. You should have your blood pressure measured twice-once when you are at a hospital or clinic, and once when you are not at a hospital or clinic. Record the average of the two measurements. To check your blood pressure when you are not at a hospital or clinic, you can use:  An automated blood pressure machine at a pharmacy.  A home blood pressure monitor.  If you are between 29 years and 60 years old, ask your health care provider if you should take aspirin to prevent strokes.  Have regular diabetes screenings. This involves taking a blood sample to check your fasting blood sugar level.  If you are at a normal weight and have a low risk for diabetes, have this test once  every three years after 66 years of age.  If you are overweight and have a high risk for diabetes, consider being tested at a younger age or more often. Preventing infection Hepatitis B  If you have a higher risk for hepatitis B, you should be screened for this virus. You are considered at high risk for hepatitis B if:  You were born in a country where hepatitis B is common. Ask your health care provider which countries are considered high risk.  Your parents were born in a high-risk country, and you have not been immunized against hepatitis B (hepatitis B vaccine).  You have HIV or AIDS.  You use needles to inject street drugs.  You live with someone who has hepatitis B.  You have had sex with someone who has hepatitis B.  You get hemodialysis treatment.  You take certain medicines for conditions, including cancer, organ transplantation, and autoimmune conditions. Hepatitis C  Blood testing is recommended for:  Everyone born from [birth year] through 1964.  Anyone with known risk factors for hepatitis C. Sexually transmitted infections (STIs)  You should be screened for sexually transmitted infections (STIs) including gonorrhea and chlamydia if:  You are sexually active and are younger than 65 years of age.  You are older than 65 years of age and your health care provider tells you that you are at risk for this type of infection.  Your sexual activity has changed since you were last screened and you are at an increased risk for chlamydia or gonorrhea. Ask your health care provider if you are at risk.  If you do not have HIV, but are at risk, it may be recommended that you take a prescription medicine daily to prevent HIV infection. This is called pre-exposure prophylaxis (PrEP). You are considered at risk if:  You are sexually active and do not regularly use condoms or know the HIV status of your partner(s).  You take drugs by injection.  You are sexually active with a partner  who has HIV. Talk with your health care provider about whether you are at high risk of being infected with HIV. If you choose to begin PrEP, you should first be tested for HIV. You should then be tested every 3 months for as long as you are taking PrEP. Pregnancy  If you are premenopausal and you may become pregnant, ask your health care provider about preconception counseling.  If you may become pregnant, take 400 to 800 micrograms (mcg) of folic acid  every day.  If you want to prevent pregnancy, talk to your health care provider about birth control (contraception). Osteoporosis and menopause  Osteoporosis is a disease in which the bones lose minerals and strength with aging. This can result in serious bone fractures. Your risk for osteoporosis can be identified using a bone density scan.  If you are 41 years of age or older, or if you are at risk for osteoporosis and fractures, ask your health care provider if you should be screened.  Ask your health care provider whether you should take a calcium or vitamin D supplement to lower your risk for osteoporosis.  Menopause may have certain physical symptoms and risks.  Hormone replacement therapy may reduce some of these symptoms and risks. Talk to your health care provider about whether hormone replacement therapy is right for you. Follow these instructions at home:  Schedule regular health, dental, and eye exams.  Stay current with your immunizations.  Do not use any tobacco products including cigarettes, chewing tobacco, or electronic cigarettes.  If you are pregnant, do not drink alcohol.  If you are breastfeeding, limit how much and how often you drink alcohol.  Limit alcohol intake to no more than 1 drink per day for nonpregnant women. One drink equals 12 ounces of beer, 5 ounces of wine, or 1 ounces of hard liquor.  Do not use street drugs.  Do not share needles.  Ask your health care provider for help if you need support  or information about quitting drugs.  Tell your health care provider if you often feel depressed.  Tell your health care provider if you have ever been abused or do not feel safe at home. This information is not intended to replace advice given to you by your health care provider. Make sure you discuss any questions you have with your health care provider. Document Released: 09/03/2010 Document Revised: 07/27/2015 Document Reviewed: 11/22/2014 Elsevier Interactive Patient Education  2017 [employer].

## 2016-07-18 DIAGNOSIS — Z23 Encounter for immunization: Secondary | ICD-10-CM | POA: Diagnosis not present

## 2016-07-18 NOTE — Addendum Note (Signed)
Addended by: Kavin LeechWALSH, Caila Cirelli E on: 07/18/2016 11:41 AM   Modules accepted: Orders

## 2016-08-08 ENCOUNTER — Telehealth: Payer: Self-pay | Admitting: Physician Assistant

## 2016-08-08 DIAGNOSIS — E7800 Pure hypercholesterolemia, unspecified: Secondary | ICD-10-CM

## 2016-08-08 DIAGNOSIS — J309 Allergic rhinitis, unspecified: Secondary | ICD-10-CM

## 2016-08-08 DIAGNOSIS — F419 Anxiety disorder, unspecified: Secondary | ICD-10-CM

## 2016-08-08 NOTE — Telephone Encounter (Signed)
Please review. Thanks!  

## 2016-08-08 NOTE — Telephone Encounter (Signed)
[facility] a request for a  90-days supply on the following medications. Thanks CC   fluticasone (FLONASE) 50 MCG/ACT nasal spray   simvastatin (ZOCOR) 20 MG tablet   escitalopram (LEXAPRO) 20 MG tablet   fexofenadine (ALLEGRA) 180 MG tablet

## 2016-08-09 MED ORDER — ESCITALOPRAM OXALATE 20 MG PO TABS: 20.0000 mg | ORAL_TABLET | Freq: Every day | ORAL | 1 refills | Status: DC

## 2016-08-09 MED ORDER — FLUTICASONE PROPIONATE 50 MCG/ACT NA SUSP: 2.0000 | Freq: Every day | NASAL | 6 refills | Status: DC

## 2016-08-09 MED ORDER — FEXOFENADINE HCL 180 MG PO TABS: 180.0000 mg | ORAL_TABLET | Freq: Every day | ORAL | 1 refills | Status: DC

## 2016-08-09 MED ORDER — SIMVASTATIN 20 MG PO TABS: 20.0000 mg | ORAL_TABLET | Freq: Every day | ORAL | 1 refills | Status: DC

## 2016-08-09 NOTE — Telephone Encounter (Signed)
[facility]

## 2016-09-22 DIAGNOSIS — G43119 Migraine with aura, intractable, without status migrainosus: Secondary | ICD-10-CM | POA: Insufficient documentation

## 2016-09-26 ENCOUNTER — Other Ambulatory Visit: Payer: Self-pay | Admitting: Neurology

## 2016-09-26 DIAGNOSIS — G43119 Migraine with aura, intractable, without status migrainosus: Secondary | ICD-10-CM

## 2016-10-03 ENCOUNTER — Ambulatory Visit: Payer: BLUE CROSS/BLUE SHIELD

## 2016-10-07 ENCOUNTER — Ambulatory Visit: Payer: BLUE CROSS/BLUE SHIELD

## 2017-01-10 ENCOUNTER — Telehealth: Payer: Self-pay | Admitting: Family Medicine

## 2017-01-10 ENCOUNTER — Telehealth: Payer: Self-pay | Admitting: Physician Assistant

## 2017-01-10 NOTE — Telephone Encounter (Signed)
Open in error

## 2017-01-10 NOTE — Telephone Encounter (Signed)
[facility] faxed a request for the following medication. Thanks CC  tiZANidine (ZANAFLEX) 4 MG tablet

## 2017-01-10 NOTE — Telephone Encounter (Signed)
Last filled 04/19/16. KW

## 2017-01-13 ENCOUNTER — Other Ambulatory Visit: Payer: Self-pay | Admitting: Physician Assistant

## 2017-01-13 DIAGNOSIS — J309 Allergic rhinitis, unspecified: Secondary | ICD-10-CM

## 2017-01-13 DIAGNOSIS — E7800 Pure hypercholesterolemia, unspecified: Secondary | ICD-10-CM

## 2017-01-13 NOTE — Telephone Encounter (Signed)
See refill request.

## 2017-01-13 NOTE — Telephone Encounter (Signed)
Looks like it was refilled by Dr. Sherryll BurgerShah at neurology. Will not refill again.

## 2017-01-15 ENCOUNTER — Telehealth: Payer: Self-pay | Admitting: Physician Assistant

## 2017-01-15 ENCOUNTER — Other Ambulatory Visit: Payer: Self-pay | Admitting: Family Medicine

## 2017-01-15 MED ORDER — TIZANIDINE HCL 4 MG PO TABS: ORAL_TABLET | ORAL | 5 refills | Status: DC

## 2017-01-15 NOTE — Telephone Encounter (Signed)
Pt contacted office for refill request on the following medications:  tiZANidine (ZANAFLEX) 4 MG tablet  [facility]  JY#[phone]/QMCB#[phone]/MW

## 2017-01-15 NOTE — Telephone Encounter (Signed)
I have refilled the Zanaflex.

## 2017-01-15 NOTE — Telephone Encounter (Signed)
Last filled 04/19/16 for qty 90 with 5 refills. Please Review. KW

## 2017-02-14 ENCOUNTER — Other Ambulatory Visit: Payer: Self-pay | Admitting: Physician Assistant

## 2017-02-14 DIAGNOSIS — F419 Anxiety disorder, unspecified: Secondary | ICD-10-CM

## 2017-02-14 MED ORDER — LORAZEPAM 0.5 MG PO TABS: 0.5000 mg | ORAL_TABLET | Freq: Two times a day (BID) | ORAL | 5 refills | Status: DC | PRN

## 2017-02-14 NOTE — Telephone Encounter (Signed)
Pt contacted office for refill request on the following medications:  LORazepam (ATIVAN) 0.5 MG tablet   [facility]  RU#[phone]/JYCB#[phone]/MW

## 2017-02-14 NOTE — Telephone Encounter (Signed)
RX Called in.   Thanks,   -Laura  

## 2017-02-14 NOTE — Telephone Encounter (Signed)
OK to phone in.

## 2017-08-21 ENCOUNTER — Other Ambulatory Visit: Payer: Self-pay

## 2017-08-21 DIAGNOSIS — F419 Anxiety disorder, unspecified: Secondary | ICD-10-CM

## 2017-08-21 MED ORDER — ESCITALOPRAM OXALATE 20 MG PO TABS: 20.0000 mg | ORAL_TABLET | Freq: Every day | ORAL | 0 refills | Status: DC

## 2017-08-21 NOTE — Telephone Encounter (Signed)
Tried calling patient to advise her of this. Left message to call back.

## 2017-08-21 NOTE — Telephone Encounter (Signed)
Yes, refilled for 30 days.

## 2017-08-21 NOTE — Telephone Encounter (Signed)
Needs office visit before more refills. Please schedule patient.

## 2017-08-21 NOTE — Telephone Encounter (Signed)
This is Melissa Buck's patient. Patient called stating her pharmacy has tried contacting us for a refill on this medication. Patient is completley out and needs a refill. Patient uses [facility]

## 2017-08-21 NOTE — Telephone Encounter (Signed)
Pt is scheduled for 09/10/2017.  She is asking to have her lexapro filled until her appointment.   Thanks,   -Vernona RiegerLaura

## 2017-09-10 ENCOUNTER — Ambulatory Visit: Payer: BLUE CROSS/BLUE SHIELD | Admitting: Physician Assistant

## 2017-09-16 ENCOUNTER — Encounter: Payer: Self-pay | Admitting: Physician Assistant

## 2017-09-16 ENCOUNTER — Ambulatory Visit (INDEPENDENT_AMBULATORY_CARE_PROVIDER_SITE_OTHER): Payer: Medicare Other | Admitting: Physician Assistant

## 2017-09-16 VITALS — BP 110/60 | HR 73 | Temp 97.9°F | Resp 16 | Wt 138.0 lb

## 2017-09-16 DIAGNOSIS — G43001 Migraine without aura, not intractable, with status migrainosus: Secondary | ICD-10-CM | POA: Diagnosis not present

## 2017-09-16 DIAGNOSIS — E7800 Pure hypercholesterolemia, unspecified: Secondary | ICD-10-CM | POA: Diagnosis not present

## 2017-09-16 DIAGNOSIS — F419 Anxiety disorder, unspecified: Secondary | ICD-10-CM | POA: Diagnosis not present

## 2017-09-16 MED ORDER — ESCITALOPRAM OXALATE 10 MG PO TABS
ORAL_TABLET | ORAL | 0 refills | Status: DC
Start: 2017-09-16 — End: 2017-12-16

## 2017-09-16 NOTE — Patient Instructions (Signed)

## 2017-09-16 NOTE — Progress Notes (Addendum)
Patient: Melissa PasseyDebra H Buck Female    DOB: 12/23/1950   67 y.o.   MRN: [id number] Visit Date: 09/18/2017  Today's Provider: Trey SailorsAdriana M Pollak, PA-C   Chief Complaint  Patient presents with  . Follow-up    anxiety   Subjective:    HPI   Anxiety and Depression: She is currently on lexapro 20 mg. She takes Ativan 0.5mg  three times weekly, mostly when she has to work. She reports her symptoms are stable. She also states she would like to come off the Lexapro. She says she hasn't been as anxious since retiring from her job in November. Additionally she hasn't needed Ativan since then either. She says in total, she has been on lexapro or celexa for nearly 20 years.  Insomnia: Says she takes trazodone maybe once per week.  Tobacco Abuse: Continues, 0.5 packs for 45 years. She says she hasn't done Chantix because she is worried about the side effects. Does not desire cessation aids today.  Migraines: She says she got ear piercings for her migraines which she reports has done wonders for her. She no longer is taking Reglan or Meloxicam. She takes a single 4mg  tizanidine nightly before bed.   HLD: Continues on simvastatin 20 mg. She will need labwork for this. She reports [facility] retirement [facility] She will need a welcome to medicare visit.  She reports she is marrying her ex husband, whom she divorced 25 years ago. She is very happy about this.       Allergies  Allergen Reactions  . Aspirin   . Erythromycin     Allergic to Mycins.  . Hydrocodone-Acetaminophen Itching    GI Upset  . Sulfa Antibiotics     Throat closes up     Current Outpatient Medications:  .  albuterol (PROVENTIL HFA;VENTOLIN HFA) 108 (90 Base) MCG/ACT inhaler, Inhale 2 puffs into the lungs every 6 (six) hours as needed for wheezing or shortness of breath., Disp: 1 Inhaler, Rfl: 2 .  simvastatin (ZOCOR) 20 MG tablet, TAKE 1 TABLET AT BEDTIME, Disp: 90 tablet, Rfl: 1 .  tiZANidine  (ZANAFLEX) 4 MG tablet, TAKE 1 TO 3 TABLETS BY MOUTH EVERY 8 HOURS AS NEEDED FOR HEADACHE. MAX OF 8 TABLETS PER DAY, Disp: 90 tablet, Rfl: 5 .  traZODone (DESYREL) 100 MG tablet, Take 1 tablet (100 mg total) by mouth daily., Disp: 30 tablet, Rfl: 5 .  escitalopram (LEXAPRO) 10 MG tablet, Take 1.5 tablets (15 mg total) by mouth daily for 14 days, THEN 1 tablet (10 mg total) daily for 14 days, THEN 0.5 tablets (5 mg total) daily for 14 days., Disp: 42 tablet, Rfl: 0  Review of Systems   12 point ROS reviewed and negative unless stated in HPI.   Social History   Tobacco Use  . Smoking status: Current Some Day Smoker    Packs/day: 0.50    Types: Cigarettes  . Smokeless tobacco: Never Used  Substance Use Topics  . Alcohol use: Yes    Alcohol/week: 0.6 oz    Types: 1 Glasses of wine per week   Objective:   BP 110/60 (BP Location: Left Arm, Patient Position: Sitting, Cuff Size: Normal)   Pulse 73   Temp 97.9 F (36.6 C) (Oral)   Resp 16   Wt 138 lb (62.6 kg)   SpO2 98%   BMI 25.65 kg/m  Vitals:   09/16/17 1424  BP: 110/60  Pulse: 73  Resp:  16  Temp: 97.9 F (36.6 C)  TempSrc: Oral  SpO2: 98%  Weight: 138 lb (62.6 kg)     Physical Exam  Constitutional: She is oriented to person, place, and time.  HENT:  Head: Normocephalic and atraumatic.  Cardiovascular: Normal rate and regular rhythm.  Pulmonary/Chest: Effort normal and breath sounds normal.  Neurological: She is alert and oriented to person, place, and time.  Skin: Skin is warm and dry.  Psychiatric: She has a normal mood and affect. Her behavior is normal.        Assessment & Plan:     1. Anxiety  She wishes to taper off Lexapro, do so as below. If she has reemergence of symptoms, then can restart.   - escitalopram (LEXAPRO) 10 MG tablet; Take 1.5 tablets (15 mg total) by mouth daily for 14 days, THEN 1 tablet (10 mg total) daily for 14 days, THEN 0.5 tablets (5 mg total) daily for 14 days.  Dispense: 42  tablet; Refill: 0  2. Migraine without aura and with status migrainosus, not intractable  She has had much improvement with ear piercings, takes single tizanidine at night.   3. Hypercholesteremia  Will need labs, needs welcome to Medicare.   Return in about 2 months (around 11/17/2017) for welcome to Medicare visit .  The entirety of the information documented in the History of Present Illness, Review of Systems and Physical Exam were personally obtained by me. Portions of this information were initially documented by Hetty Ely, CMA and reviewed by me for thoroughness and accuracy.             Trey Sailors, PA-C  [facility]

## 2017-10-22 ENCOUNTER — Other Ambulatory Visit: Payer: Self-pay | Admitting: Physician Assistant

## 2017-10-22 DIAGNOSIS — E7800 Pure hypercholesterolemia, unspecified: Secondary | ICD-10-CM

## 2017-10-22 MED ORDER — SIMVASTATIN 20 MG PO TABS: 20.0000 mg | ORAL_TABLET | Freq: Every day | ORAL | 0 refills | Status: DC

## 2017-10-22 NOTE — Telephone Encounter (Signed)
Pt needs a new refill on her simvastatin 20 mg  [facility]   Thanks teri

## 2017-10-22 NOTE — Telephone Encounter (Signed)
Needs labs and needs welcome to medicare. Will not refill before this please schedule.

## 2017-12-16 ENCOUNTER — Other Ambulatory Visit: Payer: Self-pay | Admitting: Physician Assistant

## 2017-12-16 ENCOUNTER — Ambulatory Visit (INDEPENDENT_AMBULATORY_CARE_PROVIDER_SITE_OTHER): Payer: Medicare Other | Admitting: Physician Assistant

## 2017-12-16 ENCOUNTER — Encounter: Payer: Self-pay | Admitting: Physician Assistant

## 2017-12-16 ENCOUNTER — Other Ambulatory Visit: Payer: Self-pay

## 2017-12-16 VITALS — BP 110/74 | HR 67 | Temp 97.7°F | Resp 16 | Ht 60.5 in | Wt 144.4 lb

## 2017-12-16 DIAGNOSIS — E7800 Pure hypercholesterolemia, unspecified: Secondary | ICD-10-CM | POA: Diagnosis not present

## 2017-12-16 DIAGNOSIS — Z23 Encounter for immunization: Secondary | ICD-10-CM | POA: Diagnosis not present

## 2017-12-16 DIAGNOSIS — Z780 Asymptomatic menopausal state: Secondary | ICD-10-CM

## 2017-12-16 DIAGNOSIS — Z131 Encounter for screening for diabetes mellitus: Secondary | ICD-10-CM

## 2017-12-16 DIAGNOSIS — Z0000 Encounter for general adult medical examination without abnormal findings: Secondary | ICD-10-CM

## 2017-12-16 DIAGNOSIS — Z1239 Encounter for other screening for malignant neoplasm of breast: Secondary | ICD-10-CM

## 2017-12-16 DIAGNOSIS — Z130 Encounter for screening for diseases of the blood and blood-forming organs and certain disorders involving the immune mechanism: Secondary | ICD-10-CM

## 2017-12-16 NOTE — Progress Notes (Addendum)
Patient: Melissa Buck, Female    DOB: [birth year], 67 y.o.   MRN: [id number] Visit Date: 12/16/2017  Today's Provider: Trey Sailors, PA-C   Chief Complaint  Patient presents with  . Medicare Wellness   Subjective:    Annual wellness visit Melissa Buck is a 67 y.o. female. She feels well. She reports exercising daily, 30 minutes. She reports she is sleeping well. Has retired recently. Takes care of grandkids.   Colonoscopy: 2015 collagenous colitis with multiple hyperplastic polyps, done with [facility], records in [facility]   PAP Smear: Hysterectomy due to abnormal uterine bleeding in 1980's.  Wt Readings from Last 3 Encounters:  12/16/17 144 lb 6.4 oz (65.5 kg)  09/16/17 138 lb (62.6 kg)  07/15/16 144 lb (65.3 kg)   Prevnar: Due today  Tobacco Abuse: Cut down to five cigarretes from half a pack a day. Smoking 50 years, since age 66. Has smoked 1/2 pack per day for that time.  Anxiety: Has come off Lexapro 10 mg and is not taking this. Doing well.   Insomnia: Not taking trazodone for sleep very frequently at all.  Muscle Spasms: Takes a zanaflex 4 mg at night  -----------------------------------------------------------   Review of Systems  Constitutional: Negative.   HENT: Negative.   Eyes: Negative.   Respiratory: Negative.   Cardiovascular: Negative.   Gastrointestinal: Negative.   Endocrine: Negative.   Genitourinary: Negative.   Musculoskeletal: Positive for arthralgias.  Skin: Negative.   Allergic/Immunologic: Negative.   Neurological: Negative.   Psychiatric/Behavioral: Negative.     Social History   Socioeconomic History  . Marital status: Divorced    Spouse name: Not on file  . Number of children: Not on file  . Years of education: Not on file  . Highest education level: Not on file  Occupational History  . Not on file  Social Needs  . Financial resource strain: Not on file  . Food insecurity:    Worry: Not on file    Inability: Not on file  . Transportation needs:    Medical: Not on file    Non-medical: Not on file  Tobacco Use  . Smoking status: Current Some Day Smoker    Packs/day: 0.50    Types: Cigarettes  . Smokeless tobacco: Never Used  Substance and Sexual Activity  . Alcohol use: Yes    Alcohol/week: 1.0 standard drinks    Types: 1 Glasses of wine per week  . Drug use: No  . Sexual activity: Not on file  Lifestyle  . Physical activity:    Days per week: 7 days    Minutes per session: 30 min  . Stress: Only a little  Relationships  . Social connections:    Talks on phone: More than three times a week    Gets together: More than three times a week    Attends religious service: Never    Active member of club or organization: No    Attends meetings of clubs or organizations: Never    Relationship status: Living with partner  . Intimate partner violence:    Fear of current or ex partner: No    Emotionally abused: No    Physically abused: No    Forced sexual activity: No  Other Topics Concern  . Not on file  Social History Narrative  . Not on file    Past Medical History:  Diagnosis Date  . Asthma   . Colitis   .  COPD (chronic obstructive pulmonary disease) [facility])      Patient Active Problem List   Diagnosis Date Noted  . Cough 05/10/2015  . Bronchitis 05/10/2015  . Fever 05/10/2015  . Abdominal pain, right upper quadrant 03/10/2015  . Dermatitis, eczematoid 03/10/2015  . D (diarrhea) 03/10/2015  . Difficulty hearing 03/10/2015  . Hypercholesteremia 03/10/2015  . Headache, migraine 03/10/2015  . Anxiety 11/14/2014  . DD (diverticular disease) 09/11/2006  . CAFL (chronic airflow limitation) (HCC) 07/01/2006  . Adaptation reaction 03/19/2002  . Allergic rhinitis 03/19/2002  . Cannot sleep 03/19/2002  . Current tobacco use 03/19/2002    Past Surgical History:  Procedure Laterality Date  . ABDOMINAL HYSTERECTOMY    . COLONOSCOPY      Her family history  includes Breast cancer in her maternal aunt; Healthy in her sister; Heart disease in her brother.      Current Outpatient Medications:  .  albuterol (PROVENTIL HFA;VENTOLIN HFA) 108 (90 Base) MCG/ACT inhaler, Inhale 2 puffs into the lungs every 6 (six) hours as needed for wheezing or shortness of breath., Disp: 1 Inhaler, Rfl: 2 .  simvastatin (ZOCOR) 20 MG tablet, Take 1 tablet (20 mg total) by mouth at bedtime., Disp: 90 tablet, Rfl: 0 .  tiZANidine (ZANAFLEX) 4 MG tablet, TAKE 1 TO 3 TABLETS BY MOUTH EVERY 8 HOURS AS NEEDED FOR HEADACHE. MAX OF 8 TABLETS PER DAY, Disp: 90 tablet, Rfl: 5 .  traZODone (DESYREL) 100 MG tablet, Take 1 tablet (100 mg total) by mouth daily. (Patient not taking: Reported on 12/16/2017), Disp: 30 tablet, Rfl: 5  Patient Care Team: Maryella Shivers as PCP - General (Physician Assistant)     Objective:   Vitals: BP 110/74 (BP Location: Left Arm, Patient Position: Sitting, Cuff Size: Normal)   Pulse 67   Temp 97.7 F (36.5 C) (Oral)   Resp 16   Ht 5' 0.5" (1.537 m)   Wt 144 lb 6.4 oz (65.5 kg)   SpO2 96%   BMI 27.74 kg/m   Physical Exam  Activities of Daily Living In your present state of health, do you have any difficulty performing the following activities: 12/16/2017  Hearing? N  Vision? N  Difficulty concentrating or making decisions? N  Walking or climbing stairs? N  Dressing or bathing? N  Doing errands, shopping? N  Some recent data might be hidden    Fall Risk Assessment Fall Risk  12/16/2017  Falls in the past year? Yes  Number falls in past yr: 2 or more  Injury with Fall? Yes     Depression Screen PHQ 2/9 Scores 12/16/2017 07/15/2016 07/12/2015  PHQ - 2 Score 0 0 0  PHQ- 9 Score 2 0 -   Audit-C Alcohol Use Screening   Alcohol Use Disorder Test (AUDIT) 12/16/2017 12/16/2017  1. How often do you have a drink containing alcohol? 3 3  2. How many drinks containing alcohol do you have on a typical day when you are drinking? 0  0  3. How often do you have six or more drinks on one occasion? 0 0  AUDIT-C Score 3 3  4. How often during the last year have you found that you were not able to stop drinking once you had started? - 0  5. How often during the last year have you failed to do what was normally expected from you becasue of drinking? - 0  6. How often during the last year have you needed a first drink in the  morning to get yourself going after a heavy drinking session? - 0  7. How often during the last year have you had a feeling of guilt of remorse after drinking? - 0  8. How often during the last year have you been unable to remember what happened the night before because you had been drinking? - 0  9. Have you or someone else been injured as a result of your drinking? - 0  10. Has a relative or friend or a doctor or another health worker been concerned about your drinking or suggested you cut down? - 0  Alcohol Use Disorder Identification Test Final Score (AUDIT) - 3    A score of 3 or more in women, and 4 or more in men indicates increased risk for alcohol abuse, EXCEPT if all of the points are from question 1  Cognitive Testing - 6-CIT  Correct? Score   What year is it? yes 0 0 or 4  What month is it? yes 0 0 or 3  Memorize:    Floyde Parkins,  43,  High [address],  [place],      What time is it? (within 1 hour) yes 0 0 or 3  Count backwards from 20 yes 0 0, 2, or 4  Name the months of the year yes 0 0, 2, or 4  Repeat name & address above yes 0 0, 2, 4, 6, 8, or 10       TOTAL SCORE  0/28   Interpretation:  Normal  Normal (0-7) Abnormal (8-28)       Assessment & Plan:     Annual Wellness Visit  Reviewed patient's Family Medical History Reviewed and updated list of patient's medical providers Assessment of cognitive impairment was done Assessed patient's functional ability Established a written schedule for health screening services Health Risk Assessent Completed and Reviewed  Exercise  Activities and Dietary recommendations Goals   None     Immunization History  Administered Date(s) Administered  . Td 09/15/2003  . Tdap 07/18/2016    Health Maintenance  Topic Date Due  . Hepatitis C Screening  12/29/1950  . HIV Screening  01/04/1967  . DEXA SCAN  01/03/2017  . PNA vac Low Risk Adult (1 of 2 - PCV13) 01/03/2017  . MAMMOGRAM  07/27/2017  . INFLUENZA VACCINE  10/02/2017  . COLONOSCOPY  10/27/2018  . TETANUS/TDAP  07/19/2026     Discussed health benefits of physical activity, and encouraged her to engage in regular exercise appropriate for her age and condition.    ------------------------------------------------------------------------------------------------------------    Trey Sailors, PA-C  [facility]

## 2017-12-16 NOTE — Addendum Note (Signed)
Addended by: Trey Sailors on: 12/16/2017 11:25 AM   Modules accepted: Orders

## 2017-12-16 NOTE — Addendum Note (Signed)
Addended by: Hyacinth Meeker on: 12/16/2017 12:04 PM   Modules accepted: Orders

## 2017-12-17 ENCOUNTER — Telehealth: Payer: Self-pay

## 2017-12-17 LAB — COMPREHENSIVE METABOLIC PANEL
ALT: 17 IU/L (ref 0–32)
AST: 17 IU/L (ref 0–40)
Albumin/Globulin Ratio: 2.1 (ref 1.2–2.2)
Albumin: 4.6 g/dL (ref 3.6–4.8)
Alkaline Phosphatase: 74 IU/L (ref 39–117)
BUN/Creatinine Ratio: 17 (ref 12–28)
BUN: 9 mg/dL (ref 8–27)
Bilirubin Total: 0.7 mg/dL (ref 0.0–1.2)
CO2: 24 mmol/L (ref 20–29)
Calcium: 9.3 mg/dL (ref 8.7–10.3)
Chloride: 100 mmol/L (ref 96–106)
Creatinine, Ser: 0.53 mg/dL — ABNORMAL LOW (ref 0.57–1.00)
GFR calc Af Amer: 115 mL/min/{1.73_m2} (ref >59)
GFR calc non Af Amer: 100 mL/min/{1.73_m2} (ref >59)
Globulin, Total: 2.2 g/dL (ref 1.5–4.5)
Glucose: 83 mg/dL (ref 65–99)
Potassium: 4.4 mmol/L (ref 3.5–5.2)
Sodium: 140 mmol/L (ref 134–144)
Total Protein: 6.8 g/dL (ref 6.0–8.5)

## 2017-12-17 LAB — CBC WITH DIFFERENTIAL/PLATELET
Basophils Absolute: 0.1 10*3/uL (ref 0.0–0.2)
Basos: 2 %
EOS (ABSOLUTE): 0.2 10*3/uL (ref 0.0–0.4)
Eos: 4 %
Hematocrit: 38.2 % (ref 34.0–46.6)
Hemoglobin: 12.5 g/dL (ref 11.1–15.9)
Immature Grans (Abs): 0 10*3/uL (ref 0.0–0.1)
Immature Granulocytes: 0 %
Lymphocytes Absolute: 1.7 10*3/uL (ref 0.7–3.1)
Lymphs: 31 %
MCH: 31.6 pg (ref 26.6–33.0)
MCHC: 32.7 g/dL (ref 31.5–35.7)
MCV: 97 fL (ref 79–97)
Monocytes Absolute: 0.5 10*3/uL (ref 0.1–0.9)
Monocytes: 8 %
Neutrophils Absolute: 3 10*3/uL (ref 1.4–7.0)
Neutrophils: 55 %
Platelets: 264 10*3/uL (ref 150–450)
RBC: 3.95 x10E6/uL (ref 3.77–5.28)
RDW: 12.2 % — ABNORMAL LOW (ref 12.3–15.4)
WBC: 5.5 10*3/uL (ref 3.4–10.8)

## 2017-12-17 LAB — LIPID PANEL
Chol/HDL Ratio: 3 ratio (ref 0.0–4.4)
Cholesterol, Total: 192 mg/dL (ref 100–199)
HDL: 65 mg/dL (ref >39)
LDL Calculated: 82 mg/dL (ref 0–99)
Triglycerides: 224 mg/dL — ABNORMAL HIGH (ref 0–149)
VLDL Cholesterol Cal: 45 mg/dL — ABNORMAL HIGH (ref 5–40)

## 2017-12-17 LAB — HEPATITIS C ANTIBODY: Hep C Virus Ab: <0.1 s/co ratio (ref 0.0–0.9)

## 2017-12-17 NOTE — Telephone Encounter (Signed)
Patient was advised.,PC 

## 2017-12-17 NOTE — Telephone Encounter (Signed)
-----   Message from Trey Sailors, [place] sent at 12/17/2017  8:28 AM EDT ----- Loney Laurence all normal, cholesterol looks well controlled. Continue current dose of statin, I will refill x 1 year.

## 2018-01-21 ENCOUNTER — Other Ambulatory Visit: Payer: BLUE CROSS/BLUE SHIELD

## 2018-01-26 ENCOUNTER — Ambulatory Visit
Admission: RE | Admit: 2018-01-26 | Discharge: 2018-01-26 | Disposition: A | Discharge status: Home / Self Care | Payer: Medicare Other | Source: Ambulatory Visit | Attending: Physician Assistant | Admitting: Physician Assistant

## 2018-01-26 DIAGNOSIS — Z780 Asymptomatic menopausal state: Secondary | ICD-10-CM | POA: Diagnosis not present

## 2018-01-26 DIAGNOSIS — Z0000 Encounter for general adult medical examination without abnormal findings: Secondary | ICD-10-CM | POA: Insufficient documentation

## 2018-01-26 DIAGNOSIS — M8589 Other specified disorders of bone density and structure, multiple sites: Secondary | ICD-10-CM | POA: Diagnosis not present

## 2018-01-27 ENCOUNTER — Telehealth: Payer: Self-pay

## 2018-01-27 NOTE — Telephone Encounter (Signed)
Patient advised as below. Patient verbalizes understanding and is in agreement with treatment plan.  

## 2018-01-27 NOTE — Telephone Encounter (Signed)
-----   Message from Trey SailorsAdriana M Pollak, [place] sent at 01/26/2018  9:22 PM EST ----- Bone density shows osteopenia, this is weakening of the bones we do not treat with Rx. Should be getting adequate calcium or vitamin D through diet and supplementations. Increase weight bearing exercises. Recheck dexa in two years.

## 2018-02-11 ENCOUNTER — Ambulatory Visit
Admission: RE | Admit: 2018-02-11 | Discharge: 2018-02-11 | Disposition: A | Discharge status: Home / Self Care | Payer: Medicare Other | Source: Ambulatory Visit | Attending: Physician Assistant | Admitting: Physician Assistant

## 2018-02-11 DIAGNOSIS — Z1231 Encounter for screening mammogram for malignant neoplasm of breast: Secondary | ICD-10-CM | POA: Insufficient documentation

## 2018-02-11 DIAGNOSIS — Z1239 Encounter for other screening for malignant neoplasm of breast: Secondary | ICD-10-CM

## 2018-02-16 ENCOUNTER — Other Ambulatory Visit: Payer: Self-pay | Admitting: Physician Assistant

## 2018-02-16 DIAGNOSIS — R519 Headache, unspecified: Secondary | ICD-10-CM

## 2018-02-16 DIAGNOSIS — R51 Headache: Principal | ICD-10-CM

## 2018-02-16 DIAGNOSIS — E7800 Pure hypercholesterolemia, unspecified: Secondary | ICD-10-CM

## 2018-02-16 NOTE — Telephone Encounter (Signed)
Pt requesting 90 day supplu of simvastatin (ZOCOR) 20 MG tablet and tiZANidine (ZANAFLEX) 4 MG [facility] [phone].  States this is her first time using them and would like to have them called in early so she can make sure she gets mediation on time.

## 2018-02-18 MED ORDER — TIZANIDINE HCL 4 MG PO TABS: ORAL_TABLET | ORAL | 1 refills | Status: DC

## 2018-02-18 MED ORDER — SIMVASTATIN 20 MG PO TABS: 20.0000 mg | ORAL_TABLET | Freq: Every day | ORAL | 1 refills | Status: DC

## 2018-02-18 NOTE — Telephone Encounter (Signed)
Patient was advised.  

## 2018-02-18 NOTE — Telephone Encounter (Signed)
Sent!

## 2018-04-06 ENCOUNTER — Ambulatory Visit (INDEPENDENT_AMBULATORY_CARE_PROVIDER_SITE_OTHER): Payer: Medicare HMO | Admitting: Family Medicine

## 2018-04-06 ENCOUNTER — Encounter: Payer: Self-pay | Admitting: Family Medicine

## 2018-04-06 VITALS — BP 103/71 | HR 84 | Temp 98.2°F | Wt 146.0 lb

## 2018-04-06 DIAGNOSIS — J449 Chronic obstructive pulmonary disease, unspecified: Secondary | ICD-10-CM

## 2018-04-06 DIAGNOSIS — R05 Cough: Secondary | ICD-10-CM

## 2018-04-06 DIAGNOSIS — Z20828 Contact with and (suspected) exposure to other viral communicable diseases: Secondary | ICD-10-CM

## 2018-04-06 DIAGNOSIS — J40 Bronchitis, not specified as acute or chronic: Secondary | ICD-10-CM | POA: Diagnosis not present

## 2018-04-06 DIAGNOSIS — R059 Cough, unspecified: Secondary | ICD-10-CM

## 2018-04-06 MED ORDER — BENZONATATE 200 MG PO CAPS: 200.0000 mg | ORAL_CAPSULE | Freq: Two times a day (BID) | ORAL | 0 refills | Status: DC | PRN

## 2018-04-06 MED ORDER — ALBUTEROL SULFATE HFA 108 (90 BASE) MCG/ACT IN AERS: 2.0000 | INHALATION_SPRAY | Freq: Four times a day (QID) | RESPIRATORY_TRACT | 2 refills | Status: DC | PRN

## 2018-04-06 MED ORDER — DOXYCYCLINE HYCLATE 100 MG PO TABS: 100.0000 mg | ORAL_TABLET | Freq: Two times a day (BID) | ORAL | 0 refills | Status: AC

## 2018-04-06 MED ORDER — OSELTAMIVIR PHOSPHATE 75 MG PO CAPS: 75.0000 mg | ORAL_CAPSULE | Freq: Two times a day (BID) | ORAL | 0 refills | Status: AC

## 2018-04-06 NOTE — Patient Instructions (Signed)
.   Please review the attached list of medications and notify my office if there are any errors.   . Please bring all of your medications to every appointment so we can make sure that our medication list is the same as yours.   

## 2018-04-06 NOTE — Progress Notes (Signed)
Patient: Melissa Buck Female    DOB: 12/14/1950   68 y.o.   MRN: [id number] Visit Date: 04/06/2018  Today's Provider: Mila Merry, MD   Chief Complaint  Patient presents with  . URI    Started four days ago. Grandchild with Flu A   Subjective:     URI   This is a new problem. The current episode started in the past 7 days. The problem has been gradually worsening. Associated symptoms include congestion, coughing, ear pain, headaches, neck pain, a plugged ear sensation, rhinorrhea, sinus pain, sneezing, a sore throat and swollen glands. Pertinent negatives include no abdominal pain, chest pain, diarrhea, nausea, vomiting or wheezing. She has tried decongestant for the symptoms. The treatment provided no relief.  she states her grandson tested positive for flu A two days ago.  Allergies  Allergen Reactions  . Aspirin   . Erythromycin     Allergic to Mycins.  . Hydrocodone-Acetaminophen Itching    GI Upset  . Sulfa Antibiotics     Throat closes up     Current Outpatient Medications:  .  albuterol (PROVENTIL HFA;VENTOLIN HFA) 108 (90 Base) MCG/ACT inhaler, Inhale 2 puffs into the lungs every 6 (six) hours as needed for wheezing or shortness of breath., Disp: 1 Inhaler, Rfl: 2 .  simvastatin (ZOCOR) 20 MG tablet, Take 1 tablet (20 mg total) by mouth at bedtime., Disp: 90 tablet, Rfl: 1 .  tiZANidine (ZANAFLEX) 4 MG tablet, TAKE 1 TO 3 TABLETS BY MOUTH EVERY 8 HOURS AS NEEDED FOR HEADACHE. MAX OF 8 TABLETS PER DAY, Disp: 90 tablet, Rfl: 1 .  traZODone (DESYREL) 100 MG tablet, Take 1 tablet (100 mg total) by mouth daily. (Patient not taking: Reported on 12/16/2017), Disp: 30 tablet, Rfl: 5  Review of Systems  Constitutional: Positive for chills and fatigue. Negative for activity change, appetite change, diaphoresis, fever and unexpected weight change.  HENT: Positive for congestion, ear discharge, ear pain, postnasal drip, rhinorrhea, sinus pressure, sinus pain, sneezing  and sore throat.   Eyes: Negative.   Respiratory: Positive for cough and chest tightness. Negative for apnea, choking, shortness of breath, wheezing and stridor.   Cardiovascular: Negative for chest pain.  Gastrointestinal: Negative.  Negative for abdominal pain, diarrhea, nausea and vomiting.  Musculoskeletal: Positive for myalgias, neck pain and neck stiffness.  Neurological: Positive for light-headedness and headaches. Negative for dizziness.    Social History   Tobacco Use  . Smoking status: Current Some Day Smoker    Packs/day: 0.50    Types: Cigarettes  . Smokeless tobacco: Never Used  Substance Use Topics  . Alcohol use: Yes    Alcohol/week: 1.0 standard drinks    Types: 1 Glasses of wine per week      Objective:   BP 103/71 (BP Location: Right Arm, Patient Position: Sitting, Cuff Size: Normal)   Pulse 84   Temp 98.2 F (36.8 C) (Oral)   Wt 146 lb (66.2 kg)   SpO2 98%   BMI 28.04 kg/m  Vitals:   04/06/18 1456  BP: 103/71  Pulse: 84  Temp: 98.2 F (36.8 C)  TempSrc: Oral  SpO2: 98%  Weight: 146 lb (66.2 kg)     Physical Exam  General Appearance:    Alert, cooperative, no distress  HENT:   ENT exam normal, no neck nodes or sinus tenderness  Eyes:    PERRL, conjunctiva/corneas clear, EOM's intact       Lungs:  Occasional expiratory wheeze, no rales, , respirations unlabored  Heart:    Regular rate and rhythm  Neurologic:   Awake, alert, oriented x 3. No apparent focal neurological           defect.         Assessment & Plan    1. Cough  - albuterol (PROVENTIL HFA;VENTOLIN HFA) 108 (90 Base) MCG/ACT inhaler; Inhale 2 puffs into the lungs every 6 (six) hours as needed for wheezing or shortness of breath.  Dispense: 1 Inhaler; Refill: 2 - benzonatate (TESSALON) 200 MG capsule; Take 1 capsule (200 mg total) by mouth 2 (two) times daily as needed for cough.  Dispense: 20 capsule; Refill: 0  2. Exposure to the flu  - oseltamivir (TAMIFLU) 75 MG  capsule; Take 1 capsule (75 mg total) by mouth 2 (two) times daily for 5 days.  Dispense: 10 capsule; Refill: 0  3. CAFL (chronic airflow limitation) (HCC)  - doxycycline (VIBRA-TABS) 100 MG tablet; Take 1 tablet (100 mg total) by mouth 2 (two) times daily for 7 days.  Dispense: 14 tablet; Refill: 0  4. Bronchitis  - doxycycline (VIBRA-TABS) 100 MG tablet; Take 1 tablet (100 mg total) by mouth 2 (two) times daily for 7 days.  Dispense: 14 tablet; Refill: 0     Mila Merry, MD  [facility]

## 2018-07-15 ENCOUNTER — Other Ambulatory Visit: Payer: Self-pay | Admitting: Physician Assistant

## 2018-07-15 DIAGNOSIS — E7800 Pure hypercholesterolemia, unspecified: Secondary | ICD-10-CM

## 2018-07-16 NOTE — Telephone Encounter (Signed)
Please Review

## 2018-08-14 ENCOUNTER — Other Ambulatory Visit: Payer: Self-pay | Admitting: Physician Assistant

## 2018-08-14 DIAGNOSIS — R519 Headache, unspecified: Secondary | ICD-10-CM

## 2018-08-14 MED ORDER — TIZANIDINE HCL 4 MG PO TABS: ORAL_TABLET | ORAL | 0 refills | Status: DC

## 2018-08-14 NOTE — Telephone Encounter (Signed)
L.O.V. 04/06/2018, please advise.

## 2018-08-14 NOTE — Telephone Encounter (Signed)
[facility] faxed refill request for the following medications:  tiZANidine (ZANAFLEX) 4 MG tablet   Please advise.

## 2018-08-19 ENCOUNTER — Telehealth: Payer: Self-pay | Admitting: Physician Assistant

## 2018-08-19 DIAGNOSIS — R519 Headache, unspecified: Secondary | ICD-10-CM

## 2018-08-19 MED ORDER — TIZANIDINE HCL 4 MG PO TABS: ORAL_TABLET | ORAL | 0 refills | Status: DC

## 2018-08-19 NOTE — Telephone Encounter (Signed)
Tizanidine changed to 90 day script.

## 2018-10-05 ENCOUNTER — Telehealth: Payer: Self-pay | Admitting: Physician Assistant

## 2018-10-05 NOTE — Telephone Encounter (Signed)
Pt needing to discuss getting back on antidepressant.  Pt wants to discuss and make an appt. Tried to make an appt but pt wanted to discuss then make the app.  Thanks, [employer]

## 2018-10-05 NOTE — Telephone Encounter (Signed)
Please disreguard.  Pt called back to make an appt.

## 2018-10-06 ENCOUNTER — Encounter: Payer: Self-pay | Admitting: Physician Assistant

## 2018-10-06 ENCOUNTER — Other Ambulatory Visit: Payer: Self-pay

## 2018-10-06 ENCOUNTER — Ambulatory Visit (INDEPENDENT_AMBULATORY_CARE_PROVIDER_SITE_OTHER): Payer: Medicare HMO | Admitting: Physician Assistant

## 2018-10-06 VITALS — BP 118/80 | HR 76 | Temp 97.9°F | Resp 16 | Wt 143.8 lb

## 2018-10-06 DIAGNOSIS — F329 Major depressive disorder, single episode, unspecified: Secondary | ICD-10-CM | POA: Diagnosis not present

## 2018-10-06 DIAGNOSIS — F419 Anxiety disorder, unspecified: Secondary | ICD-10-CM

## 2018-10-06 DIAGNOSIS — N951 Menopausal and female climacteric states: Secondary | ICD-10-CM | POA: Diagnosis not present

## 2018-10-06 DIAGNOSIS — F32A Depression, unspecified: Secondary | ICD-10-CM

## 2018-10-06 MED ORDER — HYDROXYZINE HCL 10 MG PO TABS: 10.0000 mg | ORAL_TABLET | Freq: Two times a day (BID) | ORAL | 0 refills | Status: DC | PRN

## 2018-10-06 MED ORDER — PAROXETINE HCL 10 MG PO TABS: 10.0000 mg | ORAL_TABLET | Freq: Every day | ORAL | 0 refills | Status: DC

## 2018-10-06 NOTE — Patient Instructions (Signed)

## 2018-10-06 NOTE — Progress Notes (Signed)
Patient: Melissa Buck Female    DOB: 1950/05/23   68 y.o.   MRN: [id number] Visit Date: 10/06/2018  Today's Provider: Trinna Post, PA-C   Chief Complaint  Patient presents with  . Anxiety  . Depression   Subjective:     HPI  Follow up for anxiety and depression  The patient was last seen for this 1 years ago. Changes made at last visit include taper off lexapro. Previously she was on either Lexapro or Celexa for the previous 20 years. After stopping her work, she reported that she hadn't been anxious and wanted to come off both the Lexapro and Ativan that she had been taking. Today, she reports worsening anxiety and depression. This has been exacerbated by COVID. She reports a decrease in sex drive as well. Denies thoughts of self harm or suicide. Reports crying spells. Reports she has cared for her mother in law who was sick and recently died.   Patient also c/o worsening menopause symptoms. Patient reports worsening hot flashes, and no sex drive.   She reports excellent compliance with treatment. She feels that condition is Worse. She is not having side effects.   Depression screen [facility] 2/9 10/06/2018 12/16/2017 07/15/2016 07/12/2015  Decreased Interest 2 0 0 0  Down, Depressed, Hopeless 2 0 0 0  PHQ - 2 Score 4 0 0 0  Altered sleeping 1 0 0 -  Tired, decreased energy 1 1 0 -  Change in appetite 1 0 0 -  Feeling bad or failure about yourself  2 1 0 -  Trouble concentrating 2 0 0 -  Moving slowly or fidgety/restless 0 0 0 -  Suicidal thoughts 0 0 0 -  PHQ-9 Score 11 2 0 -  Difficult doing work/chores Somewhat difficult Not difficult at all - -   GAD 7 : Generalized Anxiety Score 10/06/2018  Nervous, Anxious, on Edge 2  Control/stop worrying 2  Worry too much - different things 3  Trouble relaxing 2  Restless 2  Easily annoyed or irritable 3  Afraid - awful might happen 1  Total GAD 7 Score 15  Anxiety Difficulty Very difficult    ------------------------------------------------------------------------------------   Allergies  Allergen Reactions  . Aspirin   . Erythromycin     Allergic to Mycins.  . Hydrocodone-Acetaminophen Itching    GI Upset  . Sulfa Antibiotics     Throat closes up     Current Outpatient Medications:  .  albuterol (PROVENTIL HFA;VENTOLIN HFA) 108 (90 Base) MCG/ACT inhaler, Inhale 2 puffs into the lungs every 6 (six) hours as needed for wheezing or shortness of breath., Disp: 1 Inhaler, Rfl: 2 .  simvastatin (ZOCOR) 20 MG tablet, TAKE 1 TABLET (20 MG TOTAL) BY MOUTH AT BEDTIME., Disp: 90 tablet, Rfl: 1 .  tiZANidine (ZANAFLEX) 4 MG tablet, TAKE 1 TO 3 TABLETS BY MOUTH EVERY 8 HOURS AS NEEDED FOR HEADACHE. MAX OF 8 TABLETS PER DAY, Disp: 270 tablet, Rfl: 0  Review of Systems  Constitutional: Negative.   Cardiovascular: Negative.   Psychiatric/Behavioral: Positive for agitation and sleep disturbance. The patient is nervous/anxious.     Social History   Tobacco Use  . Smoking status: Current Some Day Smoker    Packs/day: 0.50    Types: Cigarettes  . Smokeless tobacco: Never Used  Substance Use Topics  . Alcohol use: Yes    Alcohol/week: 1.0 standard drinks    Types: 1 Glasses of wine per week  Objective:   BP 118/80 (BP Location: Left Arm, Patient Position: Sitting, Cuff Size: Normal)   Pulse 76   Temp 97.9 F (36.6 C) (Oral)   Resp 16   Wt 143 lb 12.8 oz (65.2 kg)   BMI 27.62 kg/m  Vitals:   10/06/18 1009  BP: 118/80  Pulse: 76  Resp: 16  Temp: 97.9 F (36.6 C)  TempSrc: Oral  Weight: 143 lb 12.8 oz (65.2 kg)     Physical Exam Constitutional:      Appearance: Normal appearance.  Cardiovascular:     Rate and Rhythm: Normal rate.  Pulmonary:     Effort: Pulmonary effort is normal.  Skin:    General: Skin is warm and dry.  Neurological:     Mental Status: She is alert and oriented to person, place, and time. Mental status is at baseline.   Psychiatric:        Mood and Affect: Mood is anxious and depressed.        Behavior: Behavior normal.      No results found for any visits on 10/06/18.     Assessment & Plan    1. Anxiety and depression  We will start Paxil as below which will hopefully also help with vasomotor symptoms. Telephone f/u in 6 weeks. If Paxil not working, will restart lexapro.   - PARoxetine (PAXIL) 10 MG tablet; Take 1 tablet (10 mg total) by mouth daily.  Dispense: 30 tablet; Refill: 0 - hydrOXYzine (ATARAX/VISTARIL) 10 MG tablet; Take 1 tablet (10 mg total) by mouth 2 (two) times daily as needed.  Dispense: 30 tablet; Refill: 0  2. Vasomotor Symptoms due to menopause  Start Paxil as above.   The entirety of the information documented in the History of Present Illness, Review of Systems and Physical Exam were personally obtained by me. Portions of this information were initially documented by Rondel BatonSulibeya Dimas, CMA and reviewed by me for thoroughness and accuracy.       Trey SailorsAdriana M Hamzah Savoca, PA-C  [facility]

## 2018-11-04 ENCOUNTER — Other Ambulatory Visit: Payer: Self-pay | Admitting: Physician Assistant

## 2018-11-04 DIAGNOSIS — F32A Depression, unspecified: Secondary | ICD-10-CM

## 2018-11-04 DIAGNOSIS — F329 Major depressive disorder, single episode, unspecified: Secondary | ICD-10-CM

## 2018-11-04 MED ORDER — PAROXETINE HCL 10 MG PO TABS: 10.0000 mg | ORAL_TABLET | Freq: Every day | ORAL | 2 refills | Status: DC

## 2018-11-04 MED ORDER — HYDROXYZINE HCL 10 MG PO TABS: 10.0000 mg | ORAL_TABLET | Freq: Two times a day (BID) | ORAL | 2 refills | Status: DC | PRN

## 2018-11-04 NOTE — Telephone Encounter (Signed)
Patient is almost out of Paxil and Hydroxyzine and needs these sent into [facility] mail in pharmacy ASAP.  She said she only has like 2 days worth left and its for her menapause and she doesn't want to run out.

## 2018-11-17 ENCOUNTER — Encounter: Payer: Self-pay | Admitting: Physician Assistant

## 2018-11-17 ENCOUNTER — Ambulatory Visit (INDEPENDENT_AMBULATORY_CARE_PROVIDER_SITE_OTHER): Payer: Medicare HMO | Admitting: Physician Assistant

## 2018-11-17 ENCOUNTER — Ambulatory Visit: Payer: Self-pay | Admitting: Physician Assistant

## 2018-11-17 VITALS — Ht 61.5 in | Wt 143.0 lb

## 2018-11-17 DIAGNOSIS — K635 Polyp of colon: Secondary | ICD-10-CM | POA: Diagnosis not present

## 2018-11-17 DIAGNOSIS — E7800 Pure hypercholesterolemia, unspecified: Secondary | ICD-10-CM | POA: Diagnosis not present

## 2018-11-17 DIAGNOSIS — Z1211 Encounter for screening for malignant neoplasm of colon: Secondary | ICD-10-CM

## 2018-11-17 DIAGNOSIS — F419 Anxiety disorder, unspecified: Secondary | ICD-10-CM | POA: Diagnosis not present

## 2018-11-17 MED ORDER — PAROXETINE HCL 20 MG PO TABS: 20.0000 mg | ORAL_TABLET | Freq: Every day | ORAL | 0 refills | Status: DC

## 2018-11-17 NOTE — Patient Instructions (Signed)

## 2018-11-17 NOTE — Progress Notes (Signed)
Patient: Melissa PasseyDebra H Buck Female    DOB: 05/17/1950   68 y.o.   MRN: [id number] Visit Date: 11/17/2018  Today's Provider: Trey SailorsAdriana M Pollak, PA-C   Chief Complaint  Patient presents with  . Depression  . Anxiety   Subjective:    Virtual Visit via Telephone Note  I connected with Melissa Passeyebra H Sockwell on 11/17/18 at  4:00 PM EDT by telephone and verified that I am speaking with the correct person using two identifiers.   I discussed the limitations, risks, security and privacy concerns of performing an evaluation and management service by telephone and the availability of in person appointments. I also discussed with the patient that there may be a patient responsible charge related to this service. The patient expressed understanding and agreed to proceed.  Patient location: home [facility]  Persons involved in the visit: patient, provider    HPI     Follow up for Depression and Anxiety  The patient was last seen for this 6 weeks ago. Changes made at last visit include Started Paxil 10mg  a day.  She reports fair compliance with treatment.  Pt states she was out of Paxil for about a week waiting on mail order.  So she feels like she "Has to start over with taking it."   She feels that condition is Improved, until she ran out of Paxil.  She states it came in the mail on Saturday (11/14/2018) and has started back on it.  She is not having side effects.  She has been taking hydroxyzine 10 mg at night. She has noticed night sweats have improved but still occur.   She had a colonoscopy in 2015 by Dr. Servando SnareWohl that showed collagenous colitis and multiple hyperplastic polyps. Unable to find definitive recommendation for follow up. Patient reports she has been contacted by GI clinic.   ------------------------------------------------------------------------------------    Allergies  Allergen Reactions  . Aspirin   . Erythromycin    Allergic to Mycins.  . Hydrocodone-Acetaminophen Itching    GI Upset  . Sulfa Antibiotics     Throat closes up     Current Outpatient Medications:  .  albuterol (PROVENTIL HFA;VENTOLIN HFA) 108 (90 Base) MCG/ACT inhaler, Inhale 2 puffs into the lungs every 6 (six) hours as needed for wheezing or shortness of breath., Disp: 1 Inhaler, Rfl: 2 .  hydrOXYzine (ATARAX/VISTARIL) 10 MG tablet, Take 1 tablet (10 mg total) by mouth 2 (two) times daily as needed., Disp: 30 tablet, Rfl: 2 .  simvastatin (ZOCOR) 20 MG tablet, TAKE 1 TABLET (20 MG TOTAL) BY MOUTH AT BEDTIME., Disp: 90 tablet, Rfl: 1 .  tiZANidine (ZANAFLEX) 4 MG tablet, TAKE 1 TO 3 TABLETS BY MOUTH EVERY 8 HOURS AS NEEDED FOR HEADACHE. MAX OF 8 TABLETS PER DAY, Disp: 270 tablet, Rfl: 0 .  PARoxetine (PAXIL) 20 MG tablet, Take 1 tablet (20 mg total) by mouth daily., Disp: 90 tablet, Rfl: 0  Review of Systems       Social History   Tobacco Use  . Smoking status: Current Some Day Smoker    Packs/day: 0.50    Types: Cigarettes  . Smokeless tobacco: Never Used  Substance Use Topics  . Alcohol use: Yes    Alcohol/week: 1.0 standard drinks    Types: 1 Glasses of wine per week      Objective:   Ht 5' 1.5" (1.562 m)   Wt 143 lb (64.9 kg)   BMI 26.58  kg/m  Vitals:   11/17/18 1129  Weight: 143 lb (64.9 kg)  Height: 5' 1.5" (1.562 m)  Body mass index is 26.58 kg/m.   Physical Exam   Results for orders placed or performed in visit on 11/17/18  HM COLONOSCOPY  Result Value Ref Range   HM Colonoscopy See Report (in chart) See Report (in chart), Patient Reported       Assessment & Plan    1. Anxiety  We will increase Paxil to 20 mg daily.   - PARoxetine (PAXIL) 20 MG tablet; Take 1 tablet (20 mg total) by mouth daily.  Dispense: 90 tablet; Refill: 0   2. Colon cancer screening  - Ambulatory referral to Gastroenterology  4. Polyp of colon, unspecified part of colon, unspecified type  - Ambulatory referral to  Gastroenterology  5. Hypercholesteremia  Recheck labwork.   - Comprehensive Metabolic Panel (CMET) - CBC with Differential - Lipid Profile  The entirety of the information documented in the History of Present Illness, Review of Systems and Physical Exam were personally obtained by me. Portions of this information were initially documented by Ashley Royalty, CMA and reviewed by me for thoroughness and accuracy.       Trinna Post, PA-C  [facility]

## 2018-11-18 ENCOUNTER — Telehealth: Payer: Self-pay

## 2018-11-18 NOTE — Telephone Encounter (Signed)
LVM returning patients call to schedule colonoscopy. Asked her to please call back to schedule.  Thanks Leone Putman 

## 2018-11-19 ENCOUNTER — Other Ambulatory Visit: Payer: Self-pay

## 2018-11-19 ENCOUNTER — Telehealth: Payer: Self-pay

## 2018-11-19 ENCOUNTER — Encounter: Payer: Self-pay | Admitting: *Deleted

## 2018-11-19 DIAGNOSIS — Z86010 Personal history of colonic polyps: Secondary | ICD-10-CM

## 2018-11-19 MED ORDER — NA SULFATE-K SULFATE-MG SULF 17.5-3.13-1.6 GM/177ML PO SOLN: 1.0000 | Freq: Once | ORAL | 0 refills | Status: AC

## 2018-11-19 NOTE — Telephone Encounter (Signed)
Gastroenterology Pre-Procedure Review  Request Date: 12/11/18 Requesting Physician: Dr. Allen Norris  PATIENT REVIEW QUESTIONS: The patient responded to the following health history questions as indicated:    1. Are you having any GI issues? yes (Colitis already discussed with Dr. Allen Norris) 2. Do you have a personal history of Polyps? yes (2015) 3. Do you have a family history of Colon Cancer or Polyps? no 4. Diabetes Mellitus? no 5. Joint replacements in the past 12 months?no 6. Major health problems in the past 3 months?no 7. Any artificial heart valves, MVP, or defibrillator?no    MEDICATIONS & ALLERGIES:    Patient reports the following regarding taking any anticoagulation/antiplatelet therapy:   Plavix, Coumadin, Eliquis, Xarelto, Lovenox, Pradaxa, Brilinta, or Effient? no Aspirin? no  Patient confirms/reports the following medications:  Current Outpatient Medications  Medication Sig Dispense Refill  . albuterol (PROVENTIL HFA;VENTOLIN HFA) 108 (90 Base) MCG/ACT inhaler Inhale 2 puffs into the lungs every 6 (six) hours as needed for wheezing or shortness of breath. 1 Inhaler 2  . hydrOXYzine (ATARAX/VISTARIL) 10 MG tablet Take 1 tablet (10 mg total) by mouth 2 (two) times daily as needed. 30 tablet 2  . Na Sulfate-K Sulfate-Mg Sulf 17.5-3.13-1.6 GM/177ML SOLN Take 1 kit by mouth once for 1 dose. 354 mL 0  . PARoxetine (PAXIL) 20 MG tablet Take 1 tablet (20 mg total) by mouth daily. 90 tablet 0  . simvastatin (ZOCOR) 20 MG tablet TAKE 1 TABLET (20 MG TOTAL) BY MOUTH AT BEDTIME. 90 tablet 1  . tiZANidine (ZANAFLEX) 4 MG tablet TAKE 1 TO 3 TABLETS BY MOUTH EVERY 8 HOURS AS NEEDED FOR HEADACHE. MAX OF 8 TABLETS PER DAY 270 tablet 0   No current facility-administered medications for this visit.     Patient confirms/reports the following allergies:  Allergies  Allergen Reactions  . Aspirin   . Erythromycin     Allergic to Mycins.  . Hydrocodone-Acetaminophen Itching    GI Upset  . Sulfa  Antibiotics     Throat closes up    No orders of the defined types were placed in this encounter.   AUTHORIZATION INFORMATION Primary Insurance: 1D#: Group #:  Secondary Insurance: 1D#: Group #:  SCHEDULE INFORMATION: Date:  Time: Location:

## 2018-11-23 ENCOUNTER — Telehealth: Payer: Self-pay | Admitting: Gastroenterology

## 2018-11-23 NOTE — Telephone Encounter (Signed)
Pt left vm she needs to speak to Sharyn Lull to inform her of the medications she is on please call pt

## 2018-11-23 NOTE — Telephone Encounter (Signed)
Patient called again & thinks she missed Michelle's call since she was calling from a unknown caller. Please try reaching out to her again.

## 2018-11-23 NOTE — Telephone Encounter (Signed)
Pt left vm she wants to discuss the medication she is on she states she was not asked when she set up her procedure please call pt

## 2018-11-23 NOTE — Telephone Encounter (Signed)
Returned patients call to discuss meds.  She did not answer call- I called out the names of her medications as she has listed in her chart and informed her none of these medications are blood thinners therefore she does not need to stop any of her current medications, however the morning of her procedure she needs to hold all her medications and take them after her procedure.  Asked her to please call the office back if there are medications listed that we do not have listed, or if she would like to discuss in more detail with me for clarification.  Also informed her that I am working from home and if she needs me to call her back I will gladly do so-just be aware that I will be calling from a restricted/private number.  Thanks [employer]

## 2018-11-23 NOTE — Telephone Encounter (Signed)
Returned patients call LVM for her to call the office back to discuss her medication concerns in regards to her colonoscopy.  Thanks [employer]

## 2018-11-26 ENCOUNTER — Telehealth: Payer: Self-pay | Admitting: Gastroenterology

## 2018-11-26 NOTE — Telephone Encounter (Signed)
Patient has ask for a return call to go over instruction & location for colonoscopy.

## 2018-11-30 NOTE — Telephone Encounter (Signed)
Returned patients call to review colonoscopy instructions.  LVM for her to call me back to discuss.  Thanks [employer]

## 2018-12-04 NOTE — Anesthesia Preprocedure Evaluation (Addendum)
Anesthesia Evaluation  Patient identified by MRN, date of birth, ID band Patient awake    Reviewed: Allergy & Precautions, NPO status , Patient's Chart, lab work & pertinent test results  History of Anesthesia Complications Negative for: history of anesthetic complications  Airway Mallampati: III   Neck ROM: Full    Dental  (+) Upper Dentures   Pulmonary asthma , COPD, Current Smoker (5 cigarettes per day)Patient did not abstain from smoking.,    Pulmonary exam normal breath sounds clear to auscultation       Cardiovascular Exercise Tolerance: Good negative cardio ROS Normal cardiovascular exam Rhythm:Regular Rate:Normal     Neuro/Psych  Headaches, PSYCHIATRIC DISORDERS Anxiety Depression    GI/Hepatic GERD (s/p Nissen)  ,  Endo/Other  negative endocrine ROS  Renal/GU negative Renal ROS     Musculoskeletal  (+) Arthritis ,   Abdominal   Peds  Hematology negative hematology ROS (+)   Anesthesia Other Findings   Reproductive/Obstetrics                            Anesthesia Physical Anesthesia Plan  ASA: II  Anesthesia Plan: General   Post-op Pain Management:    Induction: Intravenous  PONV Risk Score and Plan: 2 and Propofol infusion and TIVA  Airway Management Planned: Natural Airway  Additional Equipment:   Intra-op Plan:   Post-operative Plan:   Informed Consent: I have reviewed the patients History and Physical, chart, labs and discussed the procedure including the risks, benefits and alternatives for the proposed anesthesia with the patient or authorized representative who has indicated his/her understanding and acceptance.       Plan Discussed with: CRNA  Anesthesia Plan Comments:        Anesthesia Quick Evaluation

## 2018-12-08 ENCOUNTER — Other Ambulatory Visit
Admission: RE | Admit: 2018-12-08 | Discharge: 2018-12-08 | Disposition: A | Discharge status: Home / Self Care | Payer: Medicare HMO | Source: Ambulatory Visit | Attending: Gastroenterology | Admitting: Gastroenterology

## 2018-12-08 ENCOUNTER — Other Ambulatory Visit: Payer: Self-pay

## 2018-12-08 DIAGNOSIS — Z86010 Personal history of colonic polyps: Secondary | ICD-10-CM | POA: Insufficient documentation

## 2018-12-08 DIAGNOSIS — Z01812 Encounter for preprocedural laboratory examination: Secondary | ICD-10-CM | POA: Diagnosis not present

## 2018-12-08 DIAGNOSIS — Z20828 Contact with and (suspected) exposure to other viral communicable diseases: Secondary | ICD-10-CM | POA: Diagnosis not present

## 2018-12-08 LAB — SARS CORONAVIRUS 2 (TAT 6-24 HRS): SARS Coronavirus 2: NEGATIVE

## 2018-12-09 NOTE — Discharge Instructions (Signed)

## 2018-12-10 ENCOUNTER — Other Ambulatory Visit: Payer: Self-pay | Admitting: Physician Assistant

## 2018-12-10 DIAGNOSIS — E7800 Pure hypercholesterolemia, unspecified: Secondary | ICD-10-CM

## 2018-12-11 ENCOUNTER — Encounter
Admission: RE | Disposition: A | Discharge status: Home / Self Care | Payer: Self-pay | Source: Home / Self Care | Attending: Gastroenterology

## 2018-12-11 ENCOUNTER — Other Ambulatory Visit: Payer: Self-pay

## 2018-12-11 ENCOUNTER — Ambulatory Visit
Admission: RE | Admit: 2018-12-11 | Discharge: 2018-12-11 | Disposition: A | Discharge status: Home / Self Care | Payer: Medicare HMO | Attending: Gastroenterology | Admitting: Gastroenterology

## 2018-12-11 ENCOUNTER — Ambulatory Visit: Payer: Medicare HMO | Admitting: Anesthesiology

## 2018-12-11 DIAGNOSIS — K641 Second degree hemorrhoids: Secondary | ICD-10-CM | POA: Insufficient documentation

## 2018-12-11 DIAGNOSIS — J449 Chronic obstructive pulmonary disease, unspecified: Secondary | ICD-10-CM | POA: Insufficient documentation

## 2018-12-11 DIAGNOSIS — F17210 Nicotine dependence, cigarettes, uncomplicated: Secondary | ICD-10-CM | POA: Insufficient documentation

## 2018-12-11 DIAGNOSIS — Z1211 Encounter for screening for malignant neoplasm of colon: Secondary | ICD-10-CM | POA: Diagnosis not present

## 2018-12-11 DIAGNOSIS — Z860100 Personal history of colon polyps, unspecified: Secondary | ICD-10-CM

## 2018-12-11 DIAGNOSIS — Z79899 Other long term (current) drug therapy: Secondary | ICD-10-CM | POA: Insufficient documentation

## 2018-12-11 DIAGNOSIS — F419 Anxiety disorder, unspecified: Secondary | ICD-10-CM | POA: Diagnosis not present

## 2018-12-11 DIAGNOSIS — F329 Major depressive disorder, single episode, unspecified: Secondary | ICD-10-CM | POA: Diagnosis not present

## 2018-12-11 HISTORY — DX: Headache, unspecified: R51.9

## 2018-12-11 HISTORY — DX: Unspecified osteoarthritis, unspecified site: M19.90

## 2018-12-11 HISTORY — DX: Presence of dental prosthetic device (complete) (partial): Z97.2

## 2018-12-11 HISTORY — PX: COLONOSCOPY WITH PROPOFOL: SHX5780

## 2018-12-11 HISTORY — DX: Gastro-esophageal reflux disease without esophagitis: K21.9

## 2018-12-11 HISTORY — DX: Presence of spectacles and contact lenses: Z97.3

## 2018-12-11 SURGERY — COLONOSCOPY WITH PROPOFOL
Anesthesia: General | Site: Rectum

## 2018-12-11 MED ORDER — PROPOFOL 10 MG/ML IV BOLUS
INTRAVENOUS | Status: DC | PRN
  Administered 2018-12-11: 30 mg via INTRAVENOUS
  Administered 2018-12-11: 40 mg via INTRAVENOUS
  Administered 2018-12-11: 50 mg via INTRAVENOUS
  Administered 2018-12-11: 40 mg via INTRAVENOUS
  Administered 2018-12-11: 150 mg via INTRAVENOUS
  Administered 2018-12-11: 30 mg via INTRAVENOUS

## 2018-12-11 MED ORDER — LACTATED RINGERS IV SOLN
10.0000 mL/h | INTRAVENOUS | Status: DC
  Administered 2018-12-11: 09:00:00 10 mL/h via INTRAVENOUS

## 2018-12-11 MED ORDER — ACETAMINOPHEN 160 MG/5ML PO SOLN: 325.0000 mg | ORAL | Status: DC | PRN

## 2018-12-11 MED ORDER — STERILE WATER FOR IRRIGATION IR SOLN
Status: DC | PRN
  Administered 2018-12-11: 100 mL

## 2018-12-11 MED ORDER — ACETAMINOPHEN 325 MG PO TABS: 650.0000 mg | ORAL_TABLET | Freq: Once | ORAL | Status: DC | PRN

## 2018-12-11 MED ORDER — ONDANSETRON HCL 4 MG/2ML IJ SOLN: 4.0000 mg | Freq: Once | INTRAMUSCULAR | Status: DC | PRN

## 2018-12-11 MED ORDER — LIDOCAINE HCL (CARDIAC) PF 100 MG/5ML IV SOSY
PREFILLED_SYRINGE | INTRAVENOUS | Status: DC | PRN
  Administered 2018-12-11: 30 mg via INTRAVENOUS

## 2018-12-11 SURGICAL SUPPLY — 5 items
CANISTER SUCT 1200ML W/VALVE (MISCELLANEOUS) ×3 IMPLANT
GOWN CVR UNV OPN BCK APRN NK (MISCELLANEOUS) ×2 IMPLANT
GOWN ISOL THUMB LOOP REG UNIV (MISCELLANEOUS) ×6
KIT ENDO PROCEDURE OLY (KITS) ×3 IMPLANT
WATER STERILE IRR 250ML POUR (IV SOLUTION) ×3 IMPLANT

## 2018-12-11 NOTE — Anesthesia Procedure Notes (Signed)
Date/Time: 12/11/2018 9:40 AM Performed by: Cameron Ali, CRNA Pre-anesthesia Checklist: Patient identified, Emergency Drugs available, Suction available, Timeout performed and Patient being monitored Patient Re-evaluated:Patient Re-evaluated prior to induction Oxygen Delivery Method: Nasal cannula Placement Confirmation: positive ETCO2

## 2018-12-11 NOTE — Op Note (Signed)
[facility] Gastroenterology Patient Name: Melissa Buck Procedure Date: 12/11/2018 9:30 AM MRN: [id number] Account #: [id number] Date of Birth: 1951-01-24 Admit Type: Outpatient Age: 68 Room: [facility] OR ROOM 01 Gender: Female Note Status: Finalized Procedure:            Colonoscopy Indications:          High risk colon cancer surveillance: Personal history                        of colonic polyps Providers:            Midge Minium MD, MD Referring MD:         Lavella Hammock. Jodi Marble (Referring MD) Medicines:            Propofol per Anesthesia Complications:        No immediate complications. Procedure:            Pre-Anesthesia Assessment:                       - Prior to the procedure, a History and Physical was                        performed, and patient medications and allergies were                        reviewed. The patient's tolerance of previous                        anesthesia was also reviewed. The risks and benefits of                        the procedure and the sedation options and risks were                        discussed with the patient. All questions were                        answered, and informed consent was obtained. Prior                        Anticoagulants: The patient has taken no previous                        anticoagulant or antiplatelet agents. ASA Grade                        Assessment: II - A patient with mild systemic disease.                        After reviewing the risks and benefits, the patient was                        deemed in satisfactory condition to undergo the                        procedure.                       After obtaining informed consent, the colonoscope was  passed under direct vision. Throughout the procedure,                        the patient's blood pressure, pulse, and oxygen                        saturations were monitored continuously. The was                        introduced  through the anus and advanced to the the                        cecum, identified by appendiceal orifice and ileocecal                        valve. The colonoscopy was performed without                        difficulty. The patient tolerated the procedure well.                        The quality of the bowel preparation was excellent. Findings:      The perianal and digital rectal examinations were normal.      Non-bleeding internal hemorrhoids were found during retroflexion. The       hemorrhoids were Grade II (internal hemorrhoids that prolapse but reduce       spontaneously). Impression:           - Non-bleeding internal hemorrhoids.                       - No specimens collected. Recommendation:       - Discharge patient to home.                       - Resume previous diet.                       - Continue present medications.                       - Repeat colonoscopy in 5 years for surveillance. Procedure Code(s):    --- Professional ---                       [id number], Colonoscopy, flexible; diagnostic, including                        collection of specimen(s) by brushing or washing, when                        performed (separate procedure) Diagnosis Code(s):    --- Professional ---                       Z86.010, Personal history of colonic polyps CPT copyright 2019 American Medical Association. All rights reserved. The codes documented in this report are preliminary and upon coder review may  be revised to meet current compliance requirements. Midge Miniumarren Bowman Higbie MD, MD 12/11/2018 10:00:27 AM This report has been signed electronically. Number of Addenda: 0 Note Initiated On: 12/11/2018 9:30 AM Scope Withdrawal Time: 0 hours 9 minutes 16 seconds  Total Procedure Duration: 0 hours  13 minutes 31 seconds  Estimated Blood Loss: Estimated blood loss: none.      [facility]

## 2018-12-11 NOTE — Anesthesia Postprocedure Evaluation (Signed)
Anesthesia Post Note  Patient: Melissa Buck  Procedure(s) Performed: COLONOSCOPY WITH PROPOFOL (N/A Rectum)  Patient location during evaluation: PACU Anesthesia Type: General Level of consciousness: awake and alert, oriented and patient cooperative Pain management: pain level controlled Vital Signs Assessment: post-procedure vital signs reviewed and stable Respiratory status: spontaneous breathing, nonlabored ventilation and respiratory function stable Cardiovascular status: blood pressure returned to baseline and stable Postop Assessment: adequate PO intake Anesthetic complications: no    Darrin Nipper

## 2018-12-11 NOTE — H&P (Signed)
Melissa Lame, MD [facility] [address], [place], [place] [address] Phone:[phone] Fax : [phone]  Primary Care Physician:  Paulene Floor Primary Gastroenterologist:  Dr. Allen Norris  Pre-Procedure History & Physical: HPI:  Melissa Buck is a 68 y.o. female is here for an colonoscopy.   Past Medical History:  Diagnosis Date  . Arthritis    knees, ankles,   . Asthma   . Colitis   . COPD (chronic obstructive pulmonary disease) ([place])   . GERD (gastroesophageal reflux disease)   . Headache    history of migraines  . Wears contact lenses   . Wears dentures    upper full plate    Past Surgical History:  Procedure Laterality Date  . ABDOMINAL HYSTERECTOMY    . CERVICAL FUSION     C5 - C6 fusion  . COLONOSCOPY      Prior to Admission medications   Medication Sig Start Date End Date Taking? Authorizing Provider  albuterol (PROVENTIL HFA;VENTOLIN HFA) 108 (90 Base) MCG/ACT inhaler Inhale 2 puffs into the lungs every 6 (six) hours as needed for wheezing or shortness of breath. 04/06/18  Yes Birdie Sons, MD  Calcium Carbonate-Vit D-Min (CALCIUM 1200 PO) Take by mouth.   Yes [provider]  hydrOXYzine (ATARAX/VISTARIL) 10 MG tablet Take 1 tablet (10 mg total) by mouth 2 (two) times daily as needed. 11/04/18  Yes Trinna Post, PA-C  Multiple Vitamin (MULTIVITAMIN) tablet Take 1 tablet by mouth daily.   Yes [provider]  PARoxetine (PAXIL) 20 MG tablet Take 1 tablet (20 mg total) by mouth daily. 11/17/18 02/15/19 Yes Trinna Post, PA-C  Probiotic Product (PROBIOTIC DAILY PO) Take by mouth.   Yes [provider]  simvastatin (ZOCOR) 20 MG tablet TAKE 1 TABLET AT BEDTIME 12/11/18  Yes Pollak, Adriana M, PA-C  tiZANidine (ZANAFLEX) 4 MG tablet TAKE 1 TO 3 TABLETS BY MOUTH EVERY 8 HOURS AS NEEDED FOR HEADACHE. MAX OF 8 TABLETS PER DAY 08/19/18  Yes Carles Collet M, PA-C  vitamin B-12 (CYANOCOBALAMIN) 1000 MCG tablet Take 1,000 mcg  by mouth daily.   Yes [provider]    Allergies as of 11/19/2018 - Review Complete 11/17/2018  Allergen Reaction Noted  . Aspirin  03/10/2015  . Erythromycin  03/10/2015  . Hydrocodone-acetaminophen Itching 03/10/2015  . Sulfa antibiotics  03/10/2015    Family History  Problem Relation Age of Onset  . Healthy Sister   . Heart disease Brother   . Breast cancer Maternal Aunt     Social History   Socioeconomic History  . Marital status: Divorced    Spouse name: Not on file  . Number of children: Not on file  . Years of education: Not on file  . Highest education level: Not on file  Occupational History  . Not on file  Social Needs  . Financial resource strain: Not on file  . Food insecurity    Worry: Not on file    Inability: Not on file  . Transportation needs    Medical: Not on file    Non-medical: Not on file  Tobacco Use  . Smoking status: Current Some Day Smoker    Packs/day: 0.25    Years: 50.00    Pack years: 12.50    Types: Cigarettes  . Smokeless tobacco: Never Used  Substance and Sexual Activity  . Alcohol use: Yes    Alcohol/week: 9.0 standard drinks    Types: 9 Glasses of wine  per week  . Drug use: No  . Sexual activity: Not on file  Lifestyle  . Physical activity    Days per week: 7 days    Minutes per session: 30 min  . Stress: Only a little  Relationships  . Social connections    Talks on phone: More than three times a week    Gets together: More than three times a week    Attends religious service: Never    Active member of club or organization: No    Attends meetings of clubs or organizations: Never    Relationship status: Living with partner  . Intimate partner violence    Fear of current or ex partner: No    Emotionally abused: No    Physically abused: No    Forced sexual activity: No  Other Topics Concern  . Not on file  Social History Narrative  . Not on file    Review of Systems: See HPI, otherwise negative ROS   Physical Exam: BP 119/75   Pulse 69   Temp 97.7 F (36.5 C) (Temporal)   Resp 16   Ht 5' 1.5" (1.562 m)   Wt 64 kg   SpO2 100%   BMI 26.21 kg/m  General:   Alert,  pleasant and cooperative in NAD Head:  Normocephalic and atraumatic. Neck:  Supple; no masses or thyromegaly. Lungs:  Clear throughout to auscultation.    Heart:  Regular rate and rhythm. Abdomen:  Soft, nontender and nondistended. Normal bowel sounds, without guarding, and without rebound.   Neurologic:  Alert and  oriented x4;  grossly normal neurologically.  Impression/Plan: Melissa Buck is here for an colonoscopy to be performed for history of colon polyps 11/26/2013  Risks, benefits, limitations, and alternatives regarding  colonoscopy have been reviewed with the patient.  Questions have been answered.  All parties agreeable.   Midge Minium, MD  12/11/2018, 9:32 AM

## 2018-12-11 NOTE — Transfer of Care (Signed)
Immediate Anesthesia Transfer of Care Note  Patient: Melissa Buck  Procedure(s) Performed: COLONOSCOPY WITH PROPOFOL (N/A Rectum)  Patient Location: PACU  Anesthesia Type: General  Level of Consciousness: awake, alert  and patient cooperative  Airway and Oxygen Therapy: Patient Spontanous Breathing and Patient connected to supplemental oxygen  Post-op Assessment: Post-op Vital signs reviewed, Patient's Cardiovascular Status Stable, Respiratory Function Stable, Patent Airway and No signs of Nausea or vomiting  Post-op Vital Signs: Reviewed and stable  Complications: No apparent anesthesia complications

## 2018-12-17 ENCOUNTER — Other Ambulatory Visit: Payer: Self-pay | Admitting: Physician Assistant

## 2018-12-17 DIAGNOSIS — F329 Major depressive disorder, single episode, unspecified: Secondary | ICD-10-CM

## 2018-12-17 DIAGNOSIS — F419 Anxiety disorder, unspecified: Secondary | ICD-10-CM

## 2019-01-18 NOTE — Progress Notes (Signed)
Subjective:   Melissa Buck is a 68 y.o. female who presents for an Initial Medicare Annual Wellness Visit.    This visit is being conducted through telemedicine due to the COVID-19 pandemic. This patient has given me verbal consent via doximity to conduct this visit, patient states they are participating from their home address. Some vital signs may be absent or patient reported.    Patient identification: identified by name, DOB, and current address  Review of Systems    N/A  Cardiac Risk Factors include: advanced age (>84men, >80 women);dyslipidemia;smoking/ tobacco exposure     Objective:    Today's Vitals   01/19/19 1503 01/19/19 1504  PainSc: 0-No pain 0-No pain   There is no height or weight on file to calculate BMI. Unable to obtain vitals due to visit being conducted via telephonically.   Advanced Directives 01/19/2019 12/11/2018 12/16/2017 05/26/2016 05/10/2015 08/14/2014  Does Patient Have a Medical Advance Directive? No No No No No No  Would patient like information on creating a medical advance directive? No - Patient declined Yes (MAU/Ambulatory/Procedural Areas - Information given) - Yes (ED - Information included in AVS) - No - patient declined information    Current Medications (verified) Outpatient Encounter Medications as of 01/19/2019  Medication Sig  . albuterol (PROVENTIL HFA;VENTOLIN HFA) 108 (90 Base) MCG/ACT inhaler Inhale 2 puffs into the lungs every 6 (six) hours as needed for wheezing or shortness of breath.  . Calcium Carbonate-Vit D-Min (CALCIUM 1200 PO) Take by mouth daily.   . cetirizine (ZYRTEC) 10 MG chewable tablet Chew 10 mg by mouth daily.  . hydrOXYzine (ATARAX/VISTARIL) 10 MG tablet TAKE 1 TABLET TWICE DAILY AS NEEDED  . Multiple Vitamin (MULTIVITAMIN) tablet Take 1 tablet by mouth daily.  Marland Kitchen PARoxetine (PAXIL) 20 MG tablet Take 1 tablet (20 mg total) by mouth daily.  . Probiotic Product (PROBIOTIC DAILY PO) Take by mouth.  . simvastatin  (ZOCOR) 20 MG tablet TAKE 1 TABLET AT BEDTIME  . tiZANidine (ZANAFLEX) 4 MG tablet TAKE 1 TO 3 TABLETS BY MOUTH EVERY 8 HOURS AS NEEDED FOR HEADACHE. MAX OF 8 TABLETS PER DAY  . vitamin B-12 (CYANOCOBALAMIN) 1000 MCG tablet Take 1,000 mcg by mouth daily.   No facility-administered encounter medications on file as of 01/19/2019.     Allergies (verified) Aspirin, Erythromycin, Hydrocodone-acetaminophen, and Sulfa antibiotics   History: Past Medical History:  Diagnosis Date  . Arthritis    knees, ankles,   . Asthma   . Colitis   . COPD (chronic obstructive pulmonary disease) (HCC)   . GERD (gastroesophageal reflux disease)   . Headache    history of migraines  . Wears contact lenses   . Wears dentures    upper full plate   Past Surgical History:  Procedure Laterality Date  . ABDOMINAL HYSTERECTOMY    . CERVICAL FUSION     C5 - C6 fusion  . COLONOSCOPY    . COLONOSCOPY WITH PROPOFOL N/A 12/11/2018   Procedure: COLONOSCOPY WITH PROPOFOL;  Surgeon: Midge Minium, MD;  Location: [facility];  Service: Endoscopy;  Laterality: N/A;   Family History  Problem Relation Age of Onset  . Healthy Sister   . Heart disease Brother   . Breast cancer Maternal Aunt    Social History   Socioeconomic History  . Marital status: Divorced    Spouse name: Not on file  . Number of children: 3  . Years of education: Not on file  . Highest education level:  Some college, no degree  Occupational History  . Occupation: retired  Engineer, production  . Financial resource strain: Not hard at all  . Food insecurity    Worry: Never true    Inability: Never true  . Transportation needs    Medical: No    Non-medical: No  Tobacco Use  . Smoking status: Current Some Day Smoker    Packs/day: 0.25    Years: 50.00    Pack years: 12.50    Types: Cigarettes  . Smokeless tobacco: Never Used  . Tobacco comment: 4 a day  Substance and Sexual Activity  . Alcohol use: Yes    Alcohol/week: 7.0 - 9.0  standard drinks    Types: 7 - 9 Glasses of wine per week  . Drug use: No  . Sexual activity: Not on file  Lifestyle  . Physical activity    Days per week: 0 days    Minutes per session: 0 min  . Stress: Only a little  Relationships  . Social Musician on phone: Patient refused    Gets together: Patient refused    Attends religious service: Patient refused    Active member of club or organization: Patient refused    Attends meetings of clubs or organizations: Patient refused    Relationship status: Patient refused  Other Topics Concern  . Not on file  Social History Narrative  . Not on file    Tobacco Counseling Ready to quit: Yes Counseling given: No Comment: 4 a day   Clinical Intake:  Pre-visit preparation completed: Yes  Pain : No/denies pain Pain Score: 0-No pain     Nutritional Risks: None Diabetes: No  How often do you need to have someone help you when you read instructions, pamphlets, or other written materials from your doctor or pharmacy?: 1 - Never  Interpreter Needed?: No  Information entered by :: [facility], LPN   Activities of Daily Living In your present state of health, do you have any difficulty performing the following activities: 01/19/2019 12/11/2018  Hearing? N N  Vision? N N  Difficulty concentrating or making decisions? N N  Walking or climbing stairs? N N  Dressing or bathing? N N  Doing errands, shopping? N -  Preparing Food and eating ? N -  Using the Toilet? N -  In the past six months, have you accidently leaked urine? Y -  Comment Occasionally, with urges. -  Do you have problems with loss of bowel control? N -  Managing your Medications? N -  Managing your Finances? N -  Housekeeping or managing your Housekeeping? N -  Some recent data might be hidden     Immunizations and Health Maintenance Immunization History  Administered Date(s) Administered  . Influenza, High Dose Seasonal PF 12/16/2017  .  Pneumococcal Conjugate-13 12/16/2017  . Td 09/15/2003  . Tdap 07/18/2016   Health Maintenance Due  Topic Date Due  . INFLUENZA VACCINE  10/03/2018  . PNA vac Low Risk Adult (2 of 2 - PPSV23) 12/17/2018    Patient Care Team: Maryella Shivers as PCP - General (Physician Assistant)  Indicate any recent Medical Services you may have received from other than [facility] providers in the past year (date may be approximate).     Assessment:   This is a routine wellness examination for Taleshia.  Hearing/Vision screen No exam data present  Dietary issues and exercise activities discussed: Current Exercise Habits: The patient does not participate in regular  exercise at present, Exercise limited by: orthopedic condition(s)  Goals    . Prevent falls     Recommend to remove any items from the home that may cause slips or trips.    . Quit Smoking     Recommend to continue efforts to reduce smoking habits until no longer smoking.       Depression Screen PHQ 2/9 Scores 01/19/2019 10/06/2018 12/16/2017 07/15/2016 07/12/2015  PHQ - 2 Score 0 4 0 0 0  PHQ- 9 Score - 11 2 0 -    Fall Risk Fall Risk  01/19/2019 12/16/2017  Falls in the past year? 1 Yes  Number falls in past yr: 1 2 or more  Injury with Fall? 0 Yes  Risk for fall due to : Impaired mobility -  Risk for fall due to: Comment Due to knee pain. Pt to follow up with PCP about knee concerns. -  Follow up Falls prevention discussed -   FALL RISK PREVENTION PERTAINING TO THE HOME:  Any stairs in or around the home? Yes  If so, are there any without handrails? No   Home free of loose throw rugs in walkways, pet beds, electrical cords, etc? Yes  Adequate lighting in your home to reduce risk of falls? Yes   ASSISTIVE DEVICES UTILIZED TO PREVENT FALLS:  Life alert? No  Use of a cane, walker or w/c? No  Grab bars in the bathroom? Yes  Shower chair or bench in shower? No  Elevated toilet seat or a handicapped toilet? No     TIMED UP AND GO:  Was the test performed? No .     Cognitive Function:     6CIT Screen 01/19/2019  What Year? 0 points  What month? 0 points  What time? 0 points  Count back from 20 0 points  Months in reverse 0 points  Repeat phrase 0 points  Total Score 0    Screening Tests Health Maintenance  Topic Date Due  . INFLUENZA VACCINE  10/03/2018  . PNA vac Low Risk Adult (2 of 2 - PPSV23) 12/17/2018  . MAMMOGRAM  02/12/2020  . DEXA SCAN  01/27/2023  . COLONOSCOPY  12/11/2023  . TETANUS/TDAP  07/19/2026  . Hepatitis C Screening  Completed    Qualifies for Shingles Vaccine? Yes . Due for Shingrix. Pt has been advised to call insurance company to determine out of pocket expense. Advised may also receive vaccine at local pharmacy or Health Dept. Verbalized acceptance and understanding.  Tdap: Up to date  Flu Vaccine: Due for Flu vaccine. Does the patient want to receive this vaccine today?  No .   Pneumococcal Vaccine: Due for Pneumococcal vaccine. Does the patient want to receive this vaccine today?  No .   Cancer Screenings:  Colorectal Screening: Completed 12/11/18. Repeat every 5 years.  Mammogram: Completed 02/11/18.   Bone Density: Completed 01/26/18. Results reflect OSTEOPENIA. Repeat every 5 years.   Lung Cancer Screening: (Low Dose CT Chest recommended if Age 49-80 years, 30 pack-year currently smoking OR have quit w/in 15years.) does qualify. An Epic message has been sent to Burgess Estelle, RN (Oncology Nurse Navigator) regarding the possible need for this exam. Raquel Sarna will review the patient's chart to determine if the patient truly qualifies for the exam. If the patient qualifies, Raquel Sarna will order the Low Dose CT of the chest to facilitate the scheduling of this exam.  Additional Screening:  Hepatitis C Screening: Up to date  Vision Screening: Recommended annual ophthalmology exams  for early detection of glaucoma and other disorders of the eye.  Dental  Screening: Recommended annual dental exams for proper oral hygiene  Community Resource Referral:  CRR required this visit?  No       Plan:  I have personally reviewed and addressed the Medicare Annual Wellness questionnaire and have noted the following in the patient's chart:  A. Medical and social history B. Use of alcohol, tobacco or illicit drugs  C. Current medications and supplements D. Functional ability and status E.  Nutritional status F.  Physical activity G. Advance directives H. List of other physicians I.  Hospitalizations, surgeries, and ER visits in previous 12 months J.  Vitals K. Screenings such as hearing and vision if needed, cognitive and depression L. Referrals and appointments   In addition, I have reviewed and discussed with patient certain preventive protocols, quality metrics, and best practice recommendations. A written personalized care plan for preventive services as well as general preventive health recommendations were provided to patient.   Darrick HuntsmanSigned,    Lashona Schaaf, [place]   [id number]/17/2020  Nurse Health Advisor   Nurse Notes: Pt to receive the flu shot and Pneumovax 23 vaccines at next in office apt.

## 2019-01-19 ENCOUNTER — Other Ambulatory Visit: Payer: Self-pay

## 2019-01-19 ENCOUNTER — Ambulatory Visit (INDEPENDENT_AMBULATORY_CARE_PROVIDER_SITE_OTHER): Payer: Medicare HMO | Admitting: Physician Assistant

## 2019-01-19 ENCOUNTER — Ambulatory Visit (INDEPENDENT_AMBULATORY_CARE_PROVIDER_SITE_OTHER): Payer: Medicare HMO

## 2019-01-19 ENCOUNTER — Encounter: Payer: Self-pay | Admitting: Physician Assistant

## 2019-01-19 DIAGNOSIS — F32A Depression, unspecified: Secondary | ICD-10-CM

## 2019-01-19 DIAGNOSIS — M25561 Pain in right knee: Secondary | ICD-10-CM

## 2019-01-19 DIAGNOSIS — F419 Anxiety disorder, unspecified: Secondary | ICD-10-CM | POA: Diagnosis not present

## 2019-01-19 DIAGNOSIS — Z0000 Encounter for general adult medical examination without abnormal findings: Secondary | ICD-10-CM | POA: Diagnosis not present

## 2019-01-19 DIAGNOSIS — F329 Major depressive disorder, single episode, unspecified: Secondary | ICD-10-CM | POA: Diagnosis not present

## 2019-01-19 DIAGNOSIS — M25562 Pain in left knee: Secondary | ICD-10-CM | POA: Diagnosis not present

## 2019-01-19 NOTE — Patient Instructions (Signed)
Melissa Buck , Thank you for taking time to come for your Medicare Wellness Visit. I appreciate your ongoing commitment to your health goals. Please review the following plan we discussed and let me know if I can assist you in the future.   Screening recommendations/referrals: Colonoscopy: Up to date, due 12/2023 Mammogram: Up to date, due 02/2020 Bone Density: Up to date, due 01/2023 Recommended yearly ophthalmology/optometry visit for glaucoma screening and checkup Recommended yearly dental visit for hygiene and checkup  Vaccinations: Influenza vaccine: Currently due. Will receive at next in office apt.  Pneumococcal vaccine: Pt declines today.  Tdap vaccine: Up to date, due 07/2026 Shingles vaccine: Pt declines today.     Advanced directives: Advance directive discussed with you today. Even though you declined this today please call our office should you change your mind and we can give you the proper paperwork for you to fill out.  Conditions/risks identified: Fall risk prevention and smoking cessation discussed today.   Next appointment: 4:00 PM today for a telephonic visit with Adriana.    Preventive Care 73 Years and Older, Female Preventive care refers to lifestyle choices and visits with your health care provider that can promote health and wellness. What does preventive care include?  A yearly physical exam. This is also called an annual well check.  Dental exams once or twice a year.  Routine eye exams. Ask your health care provider how often you should have your eyes checked.  Personal lifestyle choices, including:  Daily care of your teeth and gums.  Regular physical activity.  Eating a healthy diet.  Avoiding tobacco and drug use.  Limiting alcohol use.  Practicing safe sex.  Taking low-dose aspirin every day.  Taking vitamin and mineral supplements as recommended by your health care provider. What happens during an annual well check? The services and  screenings done by your health care provider during your annual well check will depend on your age, overall health, lifestyle risk factors, and family history of disease. Counseling  Your health care provider may ask you questions about your:  Alcohol use.  Tobacco use.  Drug use.  Emotional well-being.  Home and relationship well-being.  Sexual activity.  Eating habits.  History of falls.  Memory and ability to understand (cognition).  Work and work Statistician.  Reproductive health. Screening  You may have the following tests or measurements:  Height, weight, and BMI.  Blood pressure.  Lipid and cholesterol levels. These may be checked every 5 years, or more frequently if you are over 71 years old.  Skin check.  Lung cancer screening. You may have this screening every year starting at age 68 if you have a 30-pack-year history of smoking and currently smoke or have quit within the past 15 years.  Fecal occult blood test (FOBT) of the stool. You may have this test every year starting at age 64.  Flexible sigmoidoscopy or colonoscopy. You may have a sigmoidoscopy every 5 years or a colonoscopy every 10 years starting at age 61.  Hepatitis C blood test.  Hepatitis B blood test.  Sexually transmitted disease (STD) testing.  Diabetes screening. This is done by checking your blood sugar (glucose) after you have not eaten for a while (fasting). You may have this done every 1-3 years.  Bone density scan. This is done to screen for osteoporosis. You may have this done starting at age 34.  Mammogram. This may be done every 1-2 years. Talk to your health care provider about how  often you should have regular mammograms. Talk with your health care provider about your test results, treatment options, and if necessary, the need for more tests. Vaccines  Your health care provider may recommend certain vaccines, such as:  Influenza vaccine. This is recommended every year.   Tetanus, diphtheria, and acellular pertussis (Tdap, Td) vaccine. You may need a Td booster every 10 years.  Zoster vaccine. You may need this after age 46.  Pneumococcal 13-valent conjugate (PCV13) vaccine. One dose is recommended after age 23.  Pneumococcal polysaccharide (PPSV23) vaccine. One dose is recommended after age 39. Talk to your health care provider about which screenings and vaccines you need and how often you need them. This information is not intended to replace advice given to you by your health care provider. Make sure you discuss any questions you have with your health care provider. Document Released: 03/17/2015 Document Revised: 11/08/2015 Document Reviewed: 12/20/2014 Elsevier Interactive Patient Education  2017 [employer] Prevention in the Home Falls can cause injuries. They can happen to people of all ages. There are many things you can do to make your home safe and to help prevent falls. What can I do on the outside of my home?  Regularly fix the edges of walkways and driveways and fix any cracks.  Remove anything that might make you trip as you walk through a door, such as a raised step or threshold.  Trim any bushes or trees on the path to your home.  Use bright outdoor lighting.  Clear any walking paths of anything that might make someone trip, such as rocks or tools.  Regularly check to see if handrails are loose or broken. Make sure that both sides of any steps have handrails.  Any raised decks and porches should have guardrails on the edges.  Have any leaves, snow, or ice cleared regularly.  Use sand or salt on walking paths during winter.  Clean up any spills in your garage right away. This includes oil or grease spills. What can I do in the bathroom?  Use night lights.  Install grab bars by the toilet and in the tub and shower. Do not use towel bars as grab bars.  Use non-skid mats or decals in the tub or shower.  If you need to sit  down in the shower, use a plastic, non-slip stool.  Keep the floor dry. Clean up any water that spills on the floor as soon as it happens.  Remove soap buildup in the tub or shower regularly.  Attach bath mats securely with double-sided non-slip rug tape.  Do not have throw rugs and other things on the floor that can make you trip. What can I do in the bedroom?  Use night lights.  Make sure that you have a light by your bed that is easy to reach.  Do not use any sheets or blankets that are too big for your bed. They should not hang down onto the floor.  Have a firm chair that has side arms. You can use this for support while you get dressed.  Do not have throw rugs and other things on the floor that can make you trip. What can I do in the kitchen?  Clean up any spills right away.  Avoid walking on wet floors.  Keep items that you use a lot in easy-to-reach places.  If you need to reach something above you, use a strong step stool that has a grab bar.  Keep electrical  cords out of the way.  Do not use floor polish or wax that makes floors slippery. If you must use wax, use non-skid floor wax.  Do not have throw rugs and other things on the floor that can make you trip. What can I do with my stairs?  Do not leave any items on the stairs.  Make sure that there are handrails on both sides of the stairs and use them. Fix handrails that are broken or loose. Make sure that handrails are as long as the stairways.  Check any carpeting to make sure that it is firmly attached to the stairs. Fix any carpet that is loose or worn.  Avoid having throw rugs at the top or bottom of the stairs. If you do have throw rugs, attach them to the floor with carpet tape.  Make sure that you have a light switch at the top of the stairs and the bottom of the stairs. If you do not have them, ask someone to add them for you. What else can I do to help prevent falls?  Wear shoes that:  Do not have  high heels.  Have rubber bottoms.  Are comfortable and fit you well.  Are closed at the toe. Do not wear sandals.  If you use a stepladder:  Make sure that it is fully opened. Do not climb a closed stepladder.  Make sure that both sides of the stepladder are locked into place.  Ask someone to hold it for you, if possible.  Clearly mark and make sure that you can see:  Any grab bars or handrails.  First and last steps.  Where the edge of each step is.  Use tools that help you move around (mobility aids) if they are needed. These include:  Canes.  Walkers.  Scooters.  Crutches.  Turn on the lights when you go into a dark area. Replace any light bulbs as soon as they burn out.  Set up your furniture so you have a clear path. Avoid moving your furniture around.  If any of your floors are uneven, fix them.  If there are any pets around you, be aware of where they are.  Review your medicines with your doctor. Some medicines can make you feel dizzy. This can increase your chance of falling. Ask your doctor what other things that you can do to help prevent falls. This information is not intended to replace advice given to you by your health care provider. Make sure you discuss any questions you have with your health care provider. Document Released: 12/15/2008 Document Revised: 07/27/2015 Document Reviewed: 03/25/2014 Elsevier Interactive Patient Education  2017 [employer].

## 2019-01-19 NOTE — Progress Notes (Signed)
Patient: Melissa Buck Female    DOB: May 15, 1950   68 y.o.   MRN: [id number] Visit Date: 01/19/2019  Today's Provider: Trinna Post, PA-C   Chief Complaint  Patient presents with  . Depression  . Anxiety   Subjective:    Virtual Visit via Telephone Note  I connected with Melissa Buck on 01/19/19 at  4:00 PM EST by telephone and verified that I am speaking with the correct person using two identifiers.  Location: Patient: Home Provider: Office   I discussed the limitations, risks, security and privacy concerns of performing an evaluation and management service by telephone and the availability of in person appointments. I also discussed with the patient that there may be a patient responsible charge related to this service. The patient expressed understanding and agreed to proceed.   HPI Patient presents today for anxiety and depression follow-up. Patient last office visit was on 10/06/2018. Patient was started on Paxil. One month later, Paxil was increased from 10 to 20 mg. Patient states good compliance with treatment and no side effects. She feels this has improved her mood.   She also reports bilateral knee pain such that her knees give out. Reports this has been happening for a while. She denies any known injury.  GAD 7 : Generalized Anxiety Score 01/19/2019 10/06/2018  Nervous, Anxious, on Edge 0 2  Control/stop worrying 0 2  Worry too much - different things 0 3  Trouble relaxing 0 2  Restless 1 2  Easily annoyed or irritable 0 3  Afraid - awful might happen 0 1  Total GAD 7 Score 1 15  Anxiety Difficulty Not difficult at all Very difficult   Depression screen [facility] 2/9 01/19/2019 10/06/2018 12/16/2017 07/15/2016 07/12/2015  Decreased Interest 0 2 0 0 0  Down, Depressed, Hopeless 0 2 0 0 0  PHQ - 2 Score 0 4 0 0 0  Altered sleeping - 1 0 0 -  Tired, decreased energy - 1 1 0 -  Change in appetite - 1 0 0 -  Feeling bad or failure about yourself  - 2 1 0 -   Trouble concentrating - 2 0 0 -  Moving slowly or fidgety/restless - 0 0 0 -  Suicidal thoughts - 0 0 0 -  PHQ-9 Score - 11 2 0 -  Difficult doing work/chores - Somewhat difficult Not difficult at all - -     Allergies  Allergen Reactions  . Aspirin   . Erythromycin     Allergic to Mycins.  . Hydrocodone-Acetaminophen Itching    GI Upset  . Sulfa Antibiotics     Throat closes up     Current Outpatient Medications:  .  albuterol (PROVENTIL HFA;VENTOLIN HFA) 108 (90 Base) MCG/ACT inhaler, Inhale 2 puffs into the lungs every 6 (six) hours as needed for wheezing or shortness of breath., Disp: 1 Inhaler, Rfl: 2 .  Calcium Carbonate-Vit D-Min (CALCIUM 1200 PO), Take by mouth daily. , Disp: , Rfl:  .  cetirizine (ZYRTEC) 10 MG chewable tablet, Chew 10 mg by mouth daily., Disp: , Rfl:  .  hydrOXYzine (ATARAX/VISTARIL) 10 MG tablet, TAKE 1 TABLET TWICE DAILY AS NEEDED, Disp: 30 tablet, Rfl: 2 .  Multiple Vitamin (MULTIVITAMIN) tablet, Take 1 tablet by mouth daily., Disp: , Rfl:  .  PARoxetine (PAXIL) 20 MG tablet, Take 1 tablet (20 mg total) by mouth daily., Disp: 90 tablet, Rfl: 0 .  Probiotic Product (PROBIOTIC DAILY  PO), Take by mouth., Disp: , Rfl:  .  simvastatin (ZOCOR) 20 MG tablet, TAKE 1 TABLET AT BEDTIME, Disp: 90 tablet, Rfl: 1 .  tiZANidine (ZANAFLEX) 4 MG tablet, TAKE 1 TO 3 TABLETS BY MOUTH EVERY 8 HOURS AS NEEDED FOR HEADACHE. MAX OF 8 TABLETS PER DAY, Disp: 270 tablet, Rfl: 0 .  vitamin B-12 (CYANOCOBALAMIN) 1000 MCG tablet, Take 1,000 mcg by mouth daily., Disp: , Rfl:   Review of Systems  Social History   Tobacco Use  . Smoking status: Current Some Day Smoker    Packs/day: 0.25    Years: 50.00    Pack years: 12.50    Types: Cigarettes  . Smokeless tobacco: Never Used  . Tobacco comment: 4 a day  Substance Use Topics  . Alcohol use: Yes    Alcohol/week: 7.0 - 9.0 standard drinks    Types: 7 - 9 Glasses of wine per week      Objective:   There were no vitals  taken for this visit. There were no vitals filed for this visit.There is no height or weight on file to calculate BMI.   Physical Exam   No results found for any visits on 01/19/19.     Assessment & Plan    1. Anxiety  Continue Paxil 20 mg QD. Follow up once yearly.   2. Depression, unspecified depression type   3. Pain in both knees, unspecified chronicity  Start with xray as below. Suspect arthritis. May consider steroid injections in office if patient wishes.   - DG Knee Complete 4 Views Left; Future - DG Knee Complete 4 Views Right; Future I discussed the assessment and treatment plan with the patient. The patient was provided an opportunity to ask questions and all were answered. The patient agreed with the plan and demonstrated an understanding of the instructions.   The patient was advised to call back or seek an in-person evaluation if the symptoms worsen or if the condition fails to improve as anticipated.  I provided 15 minutes of non-face-to-face time during this encounter.     Trey Sailors, PA-C  [facility]

## 2019-01-20 ENCOUNTER — Telehealth: Payer: Self-pay | Admitting: *Deleted

## 2019-01-20 NOTE — Telephone Encounter (Signed)
Received referral for low dose lung cancer screening CT scan. Message left at phone number listed in EMR for patient to call me back to facilitate scheduling scan.  

## 2019-01-27 ENCOUNTER — Telehealth: Payer: Self-pay | Admitting: *Deleted

## 2019-01-27 NOTE — Telephone Encounter (Signed)
Contacted regarding lung screening referral. Patient requests to consider lung screening in January.

## 2019-02-02 ENCOUNTER — Other Ambulatory Visit: Payer: Self-pay | Admitting: Physician Assistant

## 2019-02-02 DIAGNOSIS — F32A Depression, unspecified: Secondary | ICD-10-CM

## 2019-02-02 DIAGNOSIS — F419 Anxiety disorder, unspecified: Secondary | ICD-10-CM

## 2019-03-15 ENCOUNTER — Telehealth: Payer: Self-pay | Admitting: *Deleted

## 2019-03-15 NOTE — Telephone Encounter (Signed)
Received referral for low dose lung cancer screening CT scan. Message left at phone number listed in EMR for patient to call me back to facilitate scheduling scan.  

## 2019-03-16 ENCOUNTER — Other Ambulatory Visit: Payer: Self-pay | Admitting: Physician Assistant

## 2019-03-16 DIAGNOSIS — F329 Major depressive disorder, single episode, unspecified: Secondary | ICD-10-CM

## 2019-03-16 DIAGNOSIS — R519 Headache, unspecified: Secondary | ICD-10-CM

## 2019-03-16 DIAGNOSIS — F419 Anxiety disorder, unspecified: Secondary | ICD-10-CM

## 2019-03-17 ENCOUNTER — Telehealth: Payer: Self-pay | Admitting: *Deleted

## 2019-03-17 NOTE — Telephone Encounter (Signed)
Discussed lung screening with patient at this time as requested in the past by her. Patient reports smoking history of smoking 1/2 pack per day or less for 51 years. She also reports currently smoking 3 cigarettes per day. Patient is made aware that she does not been 30 pack year eligibility requirement but that if she continues to smoke she may in the future. She reports she is attempting to quit smoking. Smoking cessation resources offered.

## 2019-03-31 NOTE — Telephone Encounter (Signed)
Pt called about refill for tiZANidine (ZANAFLEX) 4 MG tablet Would like this sent to the pharmacy. Please advise

## 2019-03-31 NOTE — Addendum Note (Signed)
Addended by: Kavin Leech E on: 03/31/2019 02:46 PM   Modules accepted: Orders

## 2019-04-02 MED ORDER — TIZANIDINE HCL 4 MG PO TABS: ORAL_TABLET | ORAL | 0 refills | Status: DC

## 2019-04-02 NOTE — Telephone Encounter (Signed)
Pt calling again to check on this

## 2019-04-12 ENCOUNTER — Ambulatory Visit (INDEPENDENT_AMBULATORY_CARE_PROVIDER_SITE_OTHER): Payer: Medicare HMO | Admitting: Physician Assistant

## 2019-04-12 DIAGNOSIS — F5101 Primary insomnia: Secondary | ICD-10-CM

## 2019-04-12 DIAGNOSIS — F4322 Adjustment disorder with anxiety: Secondary | ICD-10-CM

## 2019-04-12 DIAGNOSIS — F419 Anxiety disorder, unspecified: Secondary | ICD-10-CM | POA: Diagnosis not present

## 2019-04-12 DIAGNOSIS — J449 Chronic obstructive pulmonary disease, unspecified: Secondary | ICD-10-CM | POA: Diagnosis not present

## 2019-04-12 MED ORDER — TRAZODONE HCL 50 MG PO TABS: 25.0000 mg | ORAL_TABLET | Freq: Every evening | ORAL | 3 refills | Status: DC | PRN

## 2019-04-12 MED ORDER — PAROXETINE HCL 20 MG PO TABS: 20.0000 mg | ORAL_TABLET | Freq: Every day | ORAL | 1 refills | Status: DC

## 2019-04-12 NOTE — Progress Notes (Signed)
Patient: Melissa Buck Female    DOB: 1950-07-17   69 y.o.   MRN: [id number] Visit Date: 04/12/2019  Today's Provider: Trinna Post, PA-C   Chief Complaint  Patient presents with  . Anxiety  . Depression   Subjective:    I, Porsha McClurkin,CMA am acting as a Education administrator for [employer].  Virtual Visit via Telephone Note  I connected with [name] on 04/12/19 at  3:00 PM EST by telephone and verified that I am speaking with the correct person using two identifiers.  Location: Patient: home Provider: office   I discussed the limitations, risks, security and privacy concerns of performing an evaluation and management service by telephone and the availability of in person appointments. I also discussed with the patient that there may be a patient responsible charge related to this service. The patient expressed understanding and agreed to proceed.   HPI  Anxiety & Depression Patient presents today virtually for anxiety and depression follow-up. Patient last office visit was on 01/19/2019. Patient is currently taking Paxil 20 MG and Hydroxyzine 10 MG and reports good compliance with treatment. Patient states no side effects from medication. Patient states she would like to get back on her Trazodone to help her sleep, but she do not want to be on 100 MG because it was to strong for her last time.  GAD 7 : Generalized Anxiety Score 04/12/2019 01/19/2019 10/06/2018  Nervous, Anxious, on Edge 0 0 2  Control/stop worrying 1 0 2  Worry too much - different things 1 0 3  Trouble relaxing 0 0 2  Restless 1 1 2   Easily annoyed or irritable 0 0 3  Afraid - awful might happen 0 0 1  Total GAD 7 Score 3 1 15   Anxiety Difficulty Not difficult at all Not difficult at all Very difficult   Depression screen [facility] 2/9 04/12/2019 01/19/2019 10/06/2018 12/16/2017 07/15/2016  Decreased Interest 0 0 2 0 0  Down, Depressed, Hopeless 0 0 2 0 0  PHQ - 2 Score 0 0 4 0 0  Altered sleeping  1 - 1 0 0  Tired, decreased energy 0 - 1 1 0  Change in appetite 2 - 1 0 0  Feeling bad or failure about yourself  0 - 2 1 0  Trouble concentrating 0 - 2 0 0  Moving slowly or fidgety/restless 0 - 0 0 0  Suicidal thoughts 0 - 0 0 0  PHQ-9 Score 3 - 11 2 0  Difficult doing work/chores Not difficult at all - Somewhat difficult Not difficult at all -     Allergies  Allergen Reactions  . Aspirin   . Erythromycin     Allergic to Mycins.  . Hydrocodone-Acetaminophen Itching    GI Upset  . Sulfa Antibiotics     Throat closes up     Current Outpatient Medications:  .  albuterol (PROVENTIL HFA;VENTOLIN HFA) 108 (90 Base) MCG/ACT inhaler, Inhale 2 puffs into the lungs every 6 (six) hours as needed for wheezing or shortness of breath., Disp: 1 Inhaler, Rfl: 2 .  Calcium Carbonate-Vit D-Min (CALCIUM 1200 PO), Take by mouth daily. , Disp: , Rfl:  .  cetirizine (ZYRTEC) 10 MG chewable tablet, Chew 10 mg by mouth daily., Disp: , Rfl:  .  hydrOXYzine (ATARAX/VISTARIL) 10 MG tablet, TAKE 1 TABLET TWICE DAILY AS NEEDED, Disp: 30 tablet, Rfl: 2 .  Multiple Vitamin (MULTIVITAMIN) tablet, Take 1 tablet by mouth daily.,  Disp: , Rfl:  .  Probiotic Product (PROBIOTIC DAILY PO), Take by mouth., Disp: , Rfl:  .  simvastatin (ZOCOR) 20 MG tablet, TAKE 1 TABLET AT BEDTIME, Disp: 90 tablet, Rfl: 1 .  tiZANidine (ZANAFLEX) 4 MG tablet, TAKE 1 TO 3 TABLETS BY MOUTH EVERY 8 HOURS AS NEEDED FOR HEADACHE. MAX OF 8 TABLETS PER DAY, Disp: 270 tablet, Rfl: 0 .  vitamin B-12 (CYANOCOBALAMIN) 1000 MCG tablet, Take 1,000 mcg by mouth daily., Disp: , Rfl:  .  PARoxetine (PAXIL) 20 MG tablet, Take 1 tablet (20 mg total) by mouth daily., Disp: 90 tablet, Rfl: 0  Review of Systems  Constitutional: Negative.   Respiratory: Negative.   Cardiovascular: Negative.   Hematological: Negative.   Psychiatric/Behavioral: Positive for sleep disturbance. Negative for agitation, confusion, decreased concentration, self-injury and  suicidal ideas. The patient is not nervous/anxious.     Social History   Tobacco Use  . Smoking status: Current Some Day Smoker    Packs/day: 0.25    Years: 50.00    Pack years: 12.50    Types: Cigarettes  . Smokeless tobacco: Never Used  . Tobacco comment: 4 a day  Substance Use Topics  . Alcohol use: Yes    Alcohol/week: 7.0 - 9.0 standard drinks    Types: 7 - 9 Glasses of wine per week      Objective:   There were no vitals taken for this visit. There were no vitals filed for this visit.There is no height or weight on file to calculate BMI.   Physical Exam   No results found for any visits on 04/12/19.     Assessment & Plan    1. Adjustment disorder with anxious mood   2. Primary insomnia  Lower dose to 25 mg QHS initially. Follow up in 02/2020 for CPE.   - traZODone (DESYREL) 50 MG tablet; Take 0.5-1 tablets (25-50 mg total) by mouth at bedtime as needed for sleep.  Dispense: 30 tablet; Refill: 3  3. Anxiety  - PARoxetine (PAXIL) 20 MG tablet; Take 1 tablet (20 mg total) by mouth daily.  Dispense: 90 tablet; Refill: 1  I discussed the assessment and treatment plan with the patient. The patient was provided an opportunity to ask questions and all were answered. The patient agreed with the plan and demonstrated an understanding of the instructions.   The patient was advised to call back or seek an in-person evaluation if the symptoms worsen or if the condition fails to improve as anticipated.  I provided 15 minutes of non-face-to-face time during this encounter.     Trey Sailors, PA-C  [facility]

## 2019-04-13 ENCOUNTER — Encounter: Payer: Self-pay | Admitting: Physician Assistant

## 2019-04-26 ENCOUNTER — Other Ambulatory Visit: Payer: Self-pay | Admitting: Physician Assistant

## 2019-04-26 DIAGNOSIS — R519 Headache, unspecified: Secondary | ICD-10-CM

## 2019-04-26 NOTE — Telephone Encounter (Signed)
Requested medication (s) are due for refill today: yes  Requested medication (s) are on the active medication list: yes  Last refill:  03/16/19  Future visit scheduled: no  Notes to clinic:  Medication not delegated    Requested Prescriptions  Pending Prescriptions Disp Refills   tiZANidine (ZANAFLEX) 4 MG tablet [Pharmacy Med Name: TIZANIDINE HYDROCHLORIDE 4 MG Tablet] 270 tablet 0    Sig: TAKE 1 TO 3 TABLETS BY MOUTH EVERY 8 HOURS AS NEEDED FOR HEADACHE. MAX OF 8 TABLETS PER DAY      Not Delegated - Cardiovascular:  Alpha-2 Agonists - tizanidine Failed - 04/26/2019  5:19 PM      Failed - This refill cannot be delegated      Passed - Valid encounter within last 6 months    Recent Outpatient Visits           2 weeks ago Adjustment disorder with anxious mood   [facility] [place], [place], [place]   3 months ago Anxiety   [facility] [place], [place], [place]   5 months ago Anxiety   [facility] [place], [place], [place]   6 months ago Anxiety and depression   [facility] [place], [place], [place]   1 year ago Cough   [facility] Malva Limes, MD

## 2019-05-12 ENCOUNTER — Other Ambulatory Visit: Payer: Self-pay | Admitting: Physician Assistant

## 2019-05-12 DIAGNOSIS — E7800 Pure hypercholesterolemia, unspecified: Secondary | ICD-10-CM

## 2019-06-09 ENCOUNTER — Telehealth: Payer: Self-pay

## 2019-06-09 NOTE — Telephone Encounter (Signed)
LMTCB to schedule CPE. PEC please schedule when patient calls back.

## 2019-07-02 ENCOUNTER — Encounter: Payer: Medicare HMO | Admitting: Physician Assistant

## 2019-07-06 DIAGNOSIS — H524 Presbyopia: Secondary | ICD-10-CM | POA: Diagnosis not present

## 2019-07-08 ENCOUNTER — Encounter: Payer: Self-pay | Admitting: Physician Assistant

## 2019-07-08 ENCOUNTER — Other Ambulatory Visit: Payer: Self-pay

## 2019-07-08 ENCOUNTER — Ambulatory Visit (INDEPENDENT_AMBULATORY_CARE_PROVIDER_SITE_OTHER): Payer: Medicare HMO | Admitting: Physician Assistant

## 2019-07-08 VITALS — BP 128/88 | HR 75 | Temp 97.1°F | Ht 61.5 in | Wt 148.2 lb

## 2019-07-08 DIAGNOSIS — F5101 Primary insomnia: Secondary | ICD-10-CM | POA: Diagnosis not present

## 2019-07-08 DIAGNOSIS — E7800 Pure hypercholesterolemia, unspecified: Secondary | ICD-10-CM | POA: Diagnosis not present

## 2019-07-08 DIAGNOSIS — Z1231 Encounter for screening mammogram for malignant neoplasm of breast: Secondary | ICD-10-CM | POA: Diagnosis not present

## 2019-07-08 DIAGNOSIS — F419 Anxiety disorder, unspecified: Secondary | ICD-10-CM | POA: Diagnosis not present

## 2019-07-08 DIAGNOSIS — F32A Depression, unspecified: Secondary | ICD-10-CM

## 2019-07-08 DIAGNOSIS — Z780 Asymptomatic menopausal state: Secondary | ICD-10-CM

## 2019-07-08 DIAGNOSIS — Z23 Encounter for immunization: Secondary | ICD-10-CM

## 2019-07-08 DIAGNOSIS — Z0000 Encounter for general adult medical examination without abnormal findings: Secondary | ICD-10-CM | POA: Diagnosis not present

## 2019-07-08 DIAGNOSIS — F329 Major depressive disorder, single episode, unspecified: Secondary | ICD-10-CM

## 2019-07-08 NOTE — Progress Notes (Signed)
Complete physical exam   Patient: Melissa Buck   DOB: [birth year]   69 y.o. Female  MRN: [id number] Visit Date: 07/08/2019  Today's healthcare provider: Trinna Post, PA-C   Chief Complaint  Patient presents with  . Annual Exam   Had AWV with [facility] on 01/19/2019  Subjective    Melissa Buck is a 69 y.o. female who presents today for a complete physical exam.  She reports consuming a general diet. Home exercise routine includes walking 30 mintues hrs per day and day. She generally feels fairly well. She reports sleeping well. She does not have additional problems to discuss today.  HPI  Patient reports she only smokes 3 cigarettes a day.  Anxiety and Deoression Patient only take 10 mg of Paxil and reports good tolerates with the dose change. This also helps menopausal symptoms.   Lipid/Cholesterol, Follow-up  Last lipid panel Other pertinent labs  Lab Results  Component Value Date   CHOL 192 12/16/2017   HDL 65 12/16/2017   LDLCALC 82 12/16/2017   TRIG 224 (H) 12/16/2017   CHOLHDL 3.0 12/16/2017   Lab Results  Component Value Date   ALT 17 12/16/2017   AST 17 12/16/2017   PLT 264 12/16/2017   TSH 3.220 07/18/2015     She was last seen for this 3 months ago.  Management since that visit includes continue statin.  She reports excellent compliance with treatment. She is not having side effects.  Symptoms: No chest pain No chest pressure/discomfort No dyspnea No lower extremity edema No numbness or tingling of extremity No orthopnea No palpitations No paroxysmal nocturnal dyspnea No speech difficulty No syncope  Current diet: not asked Current exercise: walking  Wt Readings from Last 3 Encounters:  07/08/19 148 lb 3.2 oz (67.2 kg)  12/11/18 141 lb (64 kg)  11/17/18 143 lb (64.9 kg)   The 10-year ASCVD risk score Mikey Bussing DC Jr., et al., 2013) is:  11.1%  -----------------------------------------------------------------------------------------   Past Medical History:  Diagnosis Date  . Arthritis    knees, ankles,   . Asthma   . Colitis   . COPD (chronic obstructive pulmonary disease) ([place])   . GERD (gastroesophageal reflux disease)   . Headache    history of migraines  . Wears contact lenses   . Wears dentures    upper full plate   Past Surgical History:  Procedure Laterality Date  . ABDOMINAL HYSTERECTOMY    . CERVICAL FUSION     C5 - C6 fusion  . COLONOSCOPY    . COLONOSCOPY WITH PROPOFOL N/A 12/11/2018   Procedure: COLONOSCOPY WITH PROPOFOL;  Surgeon: [name], MD;  Location: [facility];  Service: Endoscopy;  Laterality: N/A;   Social History   Socioeconomic History  . Marital status: Divorced    Spouse name: Not on file  . Number of children: 3  . Years of education: Not on file  . Highest education level: Some college, no degree  Occupational History  . Occupation: retired  Tobacco Use  . Smoking status: Current Some Day Smoker    Packs/day: 0.25    Years: 50.00    Pack years: 12.50    Types: Cigarettes  . Smokeless tobacco: Never Used  . Tobacco comment: 4 a day  Substance and Sexual Activity  . Alcohol use: Yes    Alcohol/week: 7.0 - 9.0 standard drinks    Types: 7 - 9 Glasses of wine per week  . Drug use: No  .  Sexual activity: Not on file  Other Topics Concern  . Not on file  Social History Narrative  . Not on file   Social Determinants of Health   Financial Resource Strain: Low Risk   . Difficulty of Paying Living Expenses: Not hard at all  Food Insecurity: No Food Insecurity  . Worried About Programme researcher, broadcasting/film/video in the Last Year: Never true  . Ran Out of Food in the Last Year: Never true  Transportation Needs: No Transportation Needs  . Lack of Transportation (Medical): No  . Lack of Transportation (Non-Medical): No  Physical Activity: Inactive  . Days of Exercise per  Week: 0 days  . Minutes of Exercise per Session: 0 min  Stress:   . Feeling of Stress :   Social Connections: Unknown  . Frequency of Communication with Friends and Family: Patient refused  . Frequency of Social Gatherings with Friends and Family: Patient refused  . Attends Religious Services: Patient refused  . Active Member of Clubs or Organizations: Patient refused  . Attends Banker Meetings: Patient refused  . Marital Status: Patient refused  Intimate Partner Violence: Unknown  . Fear of Current or Ex-Partner: Patient refused  . Emotionally Abused: Patient refused  . Physically Abused: Patient refused  . Sexually Abused: Patient refused   Family Status  Relation Name Status  . Sister  Alive  . Brother  Deceased at age 79       heart attack  . Mother  Deceased at age 10  . Father  Other  . Mat Aunt  (Not Specified)   Family History  Problem Relation Age of Onset  . Healthy Sister   . Heart disease Brother   . Breast cancer Maternal Aunt    Allergies  Allergen Reactions  . Aspirin   . Erythromycin     Allergic to Mycins.  . Hydrocodone-Acetaminophen Itching    GI Upset  . Sulfa Antibiotics     Throat closes up    Patient Care Team: Maryella Shivers as PCP - General (Physician Assistant)   Medications: Outpatient Medications Prior to Visit  Medication Sig  . albuterol (PROVENTIL HFA;VENTOLIN HFA) 108 (90 Base) MCG/ACT inhaler Inhale 2 puffs into the lungs every 6 (six) hours as needed for wheezing or shortness of breath.  . Calcium Carbonate-Vit D-Min (CALCIUM 1200 PO) Take by mouth daily.   . cetirizine (ZYRTEC) 10 MG chewable tablet Chew 10 mg by mouth daily.  . hydrOXYzine (ATARAX/VISTARIL) 10 MG tablet TAKE 1 TABLET TWICE DAILY AS NEEDED  . Multiple Vitamin (MULTIVITAMIN) tablet Take 1 tablet by mouth daily.  Marland Kitchen PARoxetine (PAXIL) 20 MG tablet Take 1 tablet (20 mg total) by mouth daily.  . Probiotic Product (PROBIOTIC DAILY PO) Take by  mouth.  . simvastatin (ZOCOR) 20 MG tablet TAKE 1 TABLET AT BEDTIME  . tiZANidine (ZANAFLEX) 4 MG tablet TAKE 1 TO 3 TABLETS BY MOUTH EVERY 8 HOURS AS NEEDED FOR HEADACHE. MAX OF 8 TABLETS PER DAY  . traZODone (DESYREL) 50 MG tablet Take 0.5-1 tablets (25-50 mg total) by mouth at bedtime as needed for sleep.  . vitamin B-12 (CYANOCOBALAMIN) 1000 MCG tablet Take 1,000 mcg by mouth daily.   No facility-administered medications prior to visit.    Review of Systems  Constitutional: Negative for chills, fatigue and fever.  HENT: Negative for congestion, ear pain, rhinorrhea, sneezing and sore throat.   Eyes: Negative.  Negative for pain and redness.  Respiratory: Negative for cough,  shortness of breath and wheezing.   Cardiovascular: Negative for chest pain and leg swelling.  Gastrointestinal: Negative for abdominal pain, blood in stool, constipation, diarrhea and nausea.  Endocrine: Negative for polydipsia and polyphagia.  Genitourinary: Negative.  Negative for dysuria, flank pain, hematuria, pelvic pain, vaginal bleeding and vaginal discharge.  Musculoskeletal: Negative for arthralgias, back pain, gait problem and joint swelling.  Skin: Negative for rash.  Neurological: Negative.  Negative for dizziness, tremors, seizures, weakness, light-headedness, numbness and headaches.  Hematological: Negative for adenopathy.  Psychiatric/Behavioral: Negative.  Negative for behavioral problems, confusion and dysphoric mood. The patient is not nervous/anxious and is not hyperactive.       Objective    BP 128/88 (BP Location: Left Arm, Patient Position: Sitting, Cuff Size: Normal)   Pulse 75   Temp (!) 97.1 F (36.2 C) (Temporal)   Ht 5' 1.5" (1.562 m)   Wt 148 lb 3.2 oz (67.2 kg)   SpO2 99%   BMI 27.55 kg/m    Physical Exam Constitutional:      Appearance: Normal appearance.  HENT:     Right Ear: Tympanic membrane, ear canal and external ear normal.     Left Ear: Tympanic membrane, ear  canal and external ear normal.  Cardiovascular:     Rate and Rhythm: Normal rate and regular rhythm.     Pulses: Normal pulses.     Heart sounds: Normal heart sounds.  Pulmonary:     Effort: Pulmonary effort is normal.     Breath sounds: Normal breath sounds.  Abdominal:     General: Abdomen is flat. Bowel sounds are normal.     Palpations: Abdomen is soft.  Skin:    General: Skin is warm and dry.  Neurological:     General: No focal deficit present.     Mental Status: She is alert and oriented to person, place, and time.  Psychiatric:        Mood and Affect: Mood normal.        Behavior: Behavior normal.      Depression Screen  PHQ 2/9 Scores 07/08/2019 04/12/2019 01/19/2019  PHQ - 2 Score 0 0 0  PHQ- 9 Score 2 3 -    No results found for any visits on 07/08/19.  Assessment & Plan    1. Annual physical  Patient is UTD on labs, eye exam and received her pneumovax 23 vaccine today. - TSH - Lipid panel - Comprehensive metabolic panel - CBC with Differential/Platelet  2. Hypercholesteremia Previously well controlled Continue statin Repeat FLP and CMP Goal LDL < 100   3. Primary insomnia Continue current medication  4. Anxiety and depression Patient is only taking Paxil 10 MG instead of 20 MG.  5. Postmenopausal   6. Encounter for screening mammogram for malignant neoplasm of breast  - MM DIGITAL SCREENING BILATERAL; Future  7. Need for 23-polyvalent pneumococcal polysaccharide vaccine  - Pneumococcal polysaccharide vaccine 23-valent greater than or equal to 2yo subcutaneous/IM   Routine Health Maintenance and Physical Exam  Exercise Activities and Dietary recommendations Goals    . Prevent falls     Recommend to remove any items from the home that may cause slips or trips.    . Quit Smoking     Recommend to continue efforts to reduce smoking habits until no longer smoking.        Immunization History  Administered Date(s) Administered  .  Influenza, High Dose Seasonal PF 12/16/2017  . Pneumococcal Conjugate-13 12/16/2017  . Pneumococcal  Polysaccharide-23 07/08/2019  . Td 09/15/2003  . Tdap 07/18/2016    Health Maintenance  Topic Date Due  . COVID-19 Vaccine (1) Never done  . INFLUENZA VACCINE  10/03/2019  . MAMMOGRAM  02/12/2020  . DEXA SCAN  01/27/2023  . COLONOSCOPY  12/11/2023  . TETANUS/TDAP  07/19/2026  . Hepatitis C Screening  Completed  . PNA vac Low Risk Adult  Completed    Discussed health benefits of physical activity, and encouraged her to engage in regular exercise appropriate for her age and condition.    Return in about 1 year (around 07/07/2020) for CPE.     ITrey Sailors, PA-C, have reviewed all documentation for this visit. The documentation on 07/08/19 for the exam, diagnosis, procedures, and orders are all accurate and complete.    Maryella Shivers  [facility] [phone] (phone) [phone] (fax)  [facility]

## 2019-07-09 ENCOUNTER — Telehealth: Payer: Self-pay

## 2019-07-09 LAB — CBC WITH DIFFERENTIAL/PLATELET
Basophils Absolute: 0.1 10*3/uL (ref 0.0–0.2)
Basos: 1 %
EOS (ABSOLUTE): 0.2 10*3/uL (ref 0.0–0.4)
Eos: 3 %
Hematocrit: 38 % (ref 34.0–46.6)
Hemoglobin: 12.9 g/dL (ref 11.1–15.9)
Immature Grans (Abs): 0 10*3/uL (ref 0.0–0.1)
Immature Granulocytes: 0 %
Lymphocytes Absolute: 2.1 10*3/uL (ref 0.7–3.1)
Lymphs: 34 %
MCH: 31.4 pg (ref 26.6–33.0)
MCHC: 33.9 g/dL (ref 31.5–35.7)
MCV: 93 fL (ref 79–97)
Monocytes Absolute: 0.5 10*3/uL (ref 0.1–0.9)
Monocytes: 8 %
Neutrophils Absolute: 3.3 10*3/uL (ref 1.4–7.0)
Neutrophils: 54 %
Platelets: 309 10*3/uL (ref 150–450)
RBC: 4.11 x10E6/uL (ref 3.77–5.28)
RDW: 12.9 % (ref 11.7–15.4)
WBC: 6.1 10*3/uL (ref 3.4–10.8)

## 2019-07-09 LAB — COMPREHENSIVE METABOLIC PANEL
ALT: 21 IU/L (ref 0–32)
AST: 25 IU/L (ref 0–40)
Albumin/Globulin Ratio: 1.9 (ref 1.2–2.2)
Albumin: 4.7 g/dL (ref 3.8–4.8)
Alkaline Phosphatase: 110 IU/L (ref 39–117)
BUN/Creatinine Ratio: 13 (ref 12–28)
BUN: 9 mg/dL (ref 8–27)
Bilirubin Total: 0.5 mg/dL (ref 0.0–1.2)
CO2: 21 mmol/L (ref 20–29)
Calcium: 9.8 mg/dL (ref 8.7–10.3)
Chloride: 103 mmol/L (ref 96–106)
Creatinine, Ser: 0.68 mg/dL (ref 0.57–1.00)
GFR calc Af Amer: 105 mL/min/{1.73_m2} (ref >59)
GFR calc non Af Amer: 91 mL/min/{1.73_m2} (ref >59)
Globulin, Total: 2.5 g/dL (ref 1.5–4.5)
Glucose: 76 mg/dL (ref 65–99)
Potassium: 3.9 mmol/L (ref 3.5–5.2)
Sodium: 139 mmol/L (ref 134–144)
Total Protein: 7.2 g/dL (ref 6.0–8.5)

## 2019-07-09 LAB — TSH: TSH: 1.97 u[IU]/mL (ref 0.450–4.500)

## 2019-07-09 LAB — LIPID PANEL
Chol/HDL Ratio: 2.7 ratio (ref 0.0–4.4)
Cholesterol, Total: 184 mg/dL (ref 100–199)
HDL: 69 mg/dL (ref >39)
LDL Chol Calc (NIH): 94 mg/dL (ref 0–99)
Triglycerides: 123 mg/dL (ref 0–149)
VLDL Cholesterol Cal: 21 mg/dL (ref 5–40)

## 2019-07-09 NOTE — Telephone Encounter (Signed)
-----   Message from Trey Sailors, [place] sent at 07/09/2019  8:50 AM EDT ----- Labs stable, will see her next year.

## 2019-07-09 NOTE — Telephone Encounter (Signed)
Left message advising pt of lab results (Per DPR)  Thanks,   -Nanami Whitelaw  

## 2019-08-21 ENCOUNTER — Other Ambulatory Visit: Payer: Self-pay | Admitting: Physician Assistant

## 2019-08-21 DIAGNOSIS — F419 Anxiety disorder, unspecified: Secondary | ICD-10-CM

## 2019-08-21 NOTE — Telephone Encounter (Signed)
Requested Prescriptions  Pending Prescriptions Disp Refills  . PARoxetine (PAXIL) 20 MG tablet [Pharmacy Med Name: PAROXETINE HYDROCHLORIDE 20 MG Tablet] 90 tablet 1    Sig: TAKE 1 TABLET (20 MG TOTAL) BY MOUTH DAILY.     Psychiatry:  Antidepressants - SSRI Passed - 08/21/2019 11:25 PM      Passed - Valid encounter within last 6 months    Recent Outpatient Visits          1 month ago Annual physical exam   [facility] Osvaldo Angst M, [place]   4 months ago Adjustment disorder with anxious mood   [facility] Osvaldo Angst M, [place]   7 months ago Anxiety   [facility] Osvaldo Angst M, [place]   9 months ago Anxiety   [facility] Osvaldo Angst M, [place]   10 months ago Anxiety and depression   [facility] Osvaldo Angst [place], [place]

## 2019-09-27 ENCOUNTER — Other Ambulatory Visit: Payer: Self-pay | Admitting: Physician Assistant

## 2019-09-27 DIAGNOSIS — E7800 Pure hypercholesterolemia, unspecified: Secondary | ICD-10-CM

## 2019-09-27 NOTE — Telephone Encounter (Signed)
Requested Prescriptions  Pending Prescriptions Disp Refills   simvastatin (ZOCOR) 20 MG tablet [Pharmacy Med Name: SIMVASTATIN 20 MG Tablet] 90 tablet 3    Sig: TAKE 1 TABLET AT BEDTIME     Cardiovascular:  Antilipid - Statins Failed - 09/27/2019  2:00 PM      Failed - LDL in normal range and within 360 days    LDL Chol Calc (NIH)  Date Value Ref Range Status  07/08/2019 94 0 - 99 mg/dL Final         Passed - Total Cholesterol in normal range and within 360 days    Cholesterol, Total  Date Value Ref Range Status  07/08/2019 184 100 - 199 mg/dL Final         Passed - HDL in normal range and within 360 days    HDL  Date Value Ref Range Status  07/08/2019 69 >39 mg/dL Final         Passed - Triglycerides in normal range and within 360 days    Triglycerides  Date Value Ref Range Status  07/08/2019 123 0 - 149 mg/dL Final         Passed - Patient is not pregnant      Passed - Valid encounter within last 12 months    Recent Outpatient Visits          2 months ago Annual physical exam   [facility] Osvaldo Angst M, PA-C   5 months ago Adjustment disorder with anxious mood   [facility], Ricki Rodriguez M, [place]   8 months ago Anxiety   [facility] [place], Ricki Rodriguez M, [place]   10 months ago Anxiety   [facility] Osvaldo Angst M, [place]   11 months ago Anxiety and depression   [facility], [place], [place]

## 2019-10-05 ENCOUNTER — Other Ambulatory Visit: Payer: Self-pay

## 2019-10-05 ENCOUNTER — Ambulatory Visit (INDEPENDENT_AMBULATORY_CARE_PROVIDER_SITE_OTHER): Payer: Medicare HMO | Admitting: Physician Assistant

## 2019-10-05 ENCOUNTER — Encounter: Payer: Self-pay | Admitting: Physician Assistant

## 2019-10-05 VITALS — BP 110/75 | HR 71 | Temp 98.0°F | Resp 18 | Wt 146.0 lb

## 2019-10-05 DIAGNOSIS — M79672 Pain in left foot: Secondary | ICD-10-CM | POA: Diagnosis not present

## 2019-10-05 DIAGNOSIS — M79671 Pain in right foot: Secondary | ICD-10-CM | POA: Diagnosis not present

## 2019-10-05 NOTE — Progress Notes (Signed)
I,[name],acting as a scribe for Trey Sailors, PA-C.,have documented all relevant documentation on the behalf of Trey Sailors, PA-C,as directed by  Trey Sailors, PA-C while in the presence of Trey Sailors, PA-C.   Established patient visit   Patient: Melissa Buck   DOB: 05-11-1950   69 y.o. Female  MRN: [id number] Visit Date: 10/05/2019  Today's healthcare provider: Trey Sailors, PA-C   Chief Complaint  Patient presents with  . Referral   Subjective    HPI HPI    Podiatry    Last edited by Marlene Lard, CMA on 10/05/2019  3:09 PM. (History)       Patient is here to discuss podiatry referral. Patient has constant pain in bilateral second toenails. And also has constant pain and issues in both feet. She has broken her toes on both feet previously. She reports ingrown toenails as well.       Medications: Outpatient Medications Prior to Visit  Medication Sig  . albuterol (PROVENTIL HFA;VENTOLIN HFA) 108 (90 Base) MCG/ACT inhaler Inhale 2 puffs into the lungs every 6 (six) hours as needed for wheezing or shortness of breath.  . Calcium Carbonate-Vit D-Min (CALCIUM 1200 PO) Take by mouth daily.   . cetirizine (ZYRTEC) 10 MG chewable tablet Chew 10 mg by mouth daily.  . hydrOXYzine (ATARAX/VISTARIL) 10 MG tablet TAKE 1 TABLET TWICE DAILY AS NEEDED  . Multiple Vitamin (MULTIVITAMIN) tablet Take 1 tablet by mouth daily.  Marland Kitchen PARoxetine (PAXIL) 20 MG tablet TAKE 1 TABLET (20 MG TOTAL) BY MOUTH DAILY.  . Probiotic Product (PROBIOTIC DAILY PO) Take by mouth.  . simvastatin (ZOCOR) 20 MG tablet TAKE 1 TABLET AT BEDTIME  . tiZANidine (ZANAFLEX) 4 MG tablet TAKE 1 TO 3 TABLETS BY MOUTH EVERY 8 HOURS AS NEEDED FOR HEADACHE. MAX OF 8 TABLETS PER DAY  . traZODone (DESYREL) 50 MG tablet Take 0.5-1 tablets (25-50 mg total) by mouth at bedtime as needed for sleep.  . vitamin B-12 (CYANOCOBALAMIN) 1000 MCG tablet Take 1,000 mcg by mouth daily.   No  facility-administered medications prior to visit.    Review of Systems  Constitutional: Negative for appetite change, chills, fatigue and fever.  Respiratory: Negative for chest tightness and shortness of breath.   Cardiovascular: Negative for chest pain and palpitations.  Gastrointestinal: Negative for abdominal pain, nausea and vomiting.  Neurological: Negative for dizziness and weakness.      Objective    BP 110/75 (BP Location: Left Arm, Patient Position: Sitting, Cuff Size: Normal)   Pulse 71   Temp 98 F (36.7 C) (Other (Comment))   Resp 18   Wt 146 lb (66.2 kg)   SpO2 95%   BMI 27.14 kg/m    Physical Exam Vitals and nursing note reviewed.  Constitutional:      Appearance: Normal appearance. She is normal weight.  Cardiovascular:     Rate and Rhythm: Normal rate and regular rhythm.  Pulmonary:     Effort: Pulmonary effort is normal. No respiratory distress.  Musculoskeletal:        General: Normal range of motion.     Cervical back: Normal range of motion and neck supple.  Skin:    General: Skin is warm and dry.  Neurological:     Mental Status: She is alert.       No results found for any visits on 10/05/19.  Assessment & Plan     1. Bilateral foot pain  - Ambulatory  referral to Podiatry   Return if symptoms worsen or fail to improve.      I have spent 15 minutes with this patient, >50% of which was spent on counseling and coordination of care.    ITrey Sailors, PA-C, have reviewed all documentation for this visit. The documentation on 10/06/19 for the exam, diagnosis, procedures, and orders are all accurate and complete.    Maryella Shivers  [facility] [phone] (phone) [phone] (fax)  [facility]

## 2019-11-30 ENCOUNTER — Emergency Department: Payer: Medicare HMO

## 2019-11-30 ENCOUNTER — Encounter: Payer: Self-pay | Admitting: Emergency Medicine

## 2019-11-30 ENCOUNTER — Ambulatory Visit: Payer: Self-pay | Admitting: *Deleted

## 2019-11-30 ENCOUNTER — Other Ambulatory Visit: Payer: Self-pay

## 2019-11-30 ENCOUNTER — Emergency Department
Admission: EM | Admit: 2019-11-30 | Discharge: 2019-11-30 | Disposition: A | Discharge status: Home / Self Care | Payer: Medicare HMO | Attending: Emergency Medicine | Admitting: Emergency Medicine

## 2019-11-30 DIAGNOSIS — J449 Chronic obstructive pulmonary disease, unspecified: Secondary | ICD-10-CM | POA: Diagnosis not present

## 2019-11-30 DIAGNOSIS — G319 Degenerative disease of nervous system, unspecified: Secondary | ICD-10-CM | POA: Diagnosis not present

## 2019-11-30 DIAGNOSIS — R0789 Other chest pain: Secondary | ICD-10-CM | POA: Diagnosis not present

## 2019-11-30 DIAGNOSIS — G9389 Other specified disorders of brain: Secondary | ICD-10-CM | POA: Diagnosis not present

## 2019-11-30 DIAGNOSIS — R202 Paresthesia of skin: Secondary | ICD-10-CM | POA: Diagnosis not present

## 2019-11-30 DIAGNOSIS — F17210 Nicotine dependence, cigarettes, uncomplicated: Secondary | ICD-10-CM | POA: Diagnosis not present

## 2019-11-30 DIAGNOSIS — R252 Cramp and spasm: Secondary | ICD-10-CM | POA: Diagnosis present

## 2019-11-30 DIAGNOSIS — J45909 Unspecified asthma, uncomplicated: Secondary | ICD-10-CM | POA: Diagnosis not present

## 2019-11-30 DIAGNOSIS — R079 Chest pain, unspecified: Secondary | ICD-10-CM | POA: Diagnosis not present

## 2019-11-30 DIAGNOSIS — R9431 Abnormal electrocardiogram [ECG] [EKG]: Secondary | ICD-10-CM | POA: Diagnosis not present

## 2019-11-30 LAB — TROPONIN I (HIGH SENSITIVITY): Troponin I (High Sensitivity): 4 ng/L (ref <18)

## 2019-11-30 LAB — CBC
HCT: 36.1 % (ref 36.0–46.0)
Hemoglobin: 12.3 g/dL (ref 12.0–15.0)
MCH: 32.2 pg (ref 26.0–34.0)
MCHC: 34.1 g/dL (ref 30.0–36.0)
MCV: 94.5 fL (ref 80.0–100.0)
Platelets: 230 10*3/uL (ref 150–400)
RBC: 3.82 MIL/uL — ABNORMAL LOW (ref 3.87–5.11)
RDW: 13.5 % (ref 11.5–15.5)
WBC: 4.2 10*3/uL (ref 4.0–10.5)
nRBC: 0 % (ref 0.0–0.2)

## 2019-11-30 LAB — COMPREHENSIVE METABOLIC PANEL
ALT: 26 U/L (ref 0–44)
AST: 28 U/L (ref 15–41)
Albumin: 4 g/dL (ref 3.5–5.0)
Alkaline Phosphatase: 82 U/L (ref 38–126)
Anion gap: 9 (ref 5–15)
BUN: 8 mg/dL (ref 8–23)
CO2: 27 mmol/L (ref 22–32)
Calcium: 8.5 mg/dL — ABNORMAL LOW (ref 8.9–10.3)
Chloride: 104 mmol/L (ref 98–111)
Creatinine, Ser: 0.72 mg/dL (ref 0.44–1.00)
GFR calc Af Amer: >60 mL/min (ref >60)
GFR calc non Af Amer: >60 mL/min (ref >60)
Glucose, Bld: 91 mg/dL (ref 70–99)
Potassium: 3.9 mmol/L (ref 3.5–5.1)
Sodium: 140 mmol/L (ref 135–145)
Total Bilirubin: 0.6 mg/dL (ref 0.3–1.2)
Total Protein: 7 g/dL (ref 6.5–8.1)

## 2019-11-30 NOTE — ED Triage Notes (Signed)
Pt here for cramping to both hands and arms.  Also reports a cramping in her chest yesterday after walking her normal daily 2 miles.  Had a tingling in both legs and arms this morning.  Also c/o tingling sensation in chest.

## 2019-11-30 NOTE — ED Notes (Signed)
See triage note, pt reports woke up this morning and felt like both legs were asleep and arms tingling. Describes hands as cramping.  Reports CP while walking her 2 miles yesterday, denies CP today or SHOB. NAD noted, speaking in complete sentences

## 2019-11-30 NOTE — Telephone Encounter (Signed)
Patient is having tingling in her arms and hands. Hard to make fist. Patient had numbness in leg today too. Patient reports she had changes in her bottom lip- but it has returned to normal.Chest pain yesterday. Patient BP earlier was-142/80, O2 sat- jumped from 58 to 97. Patient is feeling tingling sensation all over body. Advised ED now- tingling in hands and arms not better and patient is very nervous and worried.  Reason for Disposition . [1] Numbness (i.e., loss of sensation) of the face, arm / hand, or leg / foot on one side of the body AND [2] sudden onset AND [3] brief (now gone)  Answer Assessment - Initial Assessment Questions 1. SYMPTOM: "What is the main symptom you are concerned about?" (e.g., weakness, numbness)     Tingling in hands and arms- hard to close hands 2. ONSET: "When did this start?" (minutes, hours, days; while sleeping)     This morning- hours 3. LAST NORMAL: "When was the last time you were normal (no symptoms)?"     yesterday 4. PATTERN "Does this come and go, or has it been constant since it started?"  "Is it present now?"     constant 5. CARDIAC SYMPTOMS: "Have you had any of the following symptoms: chest pain, difficulty breathing, palpitations?"     Yesterday- with walking- chest pain 6. NEUROLOGIC SYMPTOMS: "Have you had any of the following symptoms: headache, dizziness, vision loss, double vision, changes in speech, unsteady on your feet?"     Changes in speech- lip changes, back pain 7. OTHER SYMPTOMS: "Do you have any other symptoms?"     Weakness in hands and arms 8. PREGNANCY: "Is there any chance you are pregnant?" "When was your last menstrual period?"     n/a  Protocols used: NEUROLOGIC DEFICIT-A-AH

## 2019-11-30 NOTE — Discharge Instructions (Addendum)
Your exam, labs, EKG, chest x-ray, and head CT are all normal and reassuring at this time.  Your symptoms appear to have resolved spontaneously.  You should follow-up with your primary provider as discussed for ongoing evaluation and management of symptoms.  Consider referral to neurology for any recurrent symptoms.  Return to the ED as needed.

## 2019-12-01 ENCOUNTER — Other Ambulatory Visit: Payer: Self-pay

## 2019-12-01 ENCOUNTER — Telehealth: Payer: Self-pay | Admitting: Physician Assistant

## 2019-12-01 ENCOUNTER — Telehealth (INDEPENDENT_AMBULATORY_CARE_PROVIDER_SITE_OTHER): Payer: Medicare HMO | Admitting: Physician Assistant

## 2019-12-01 DIAGNOSIS — G8929 Other chronic pain: Secondary | ICD-10-CM | POA: Diagnosis not present

## 2019-12-01 DIAGNOSIS — S43005S Unspecified dislocation of left shoulder joint, sequela: Secondary | ICD-10-CM | POA: Diagnosis not present

## 2019-12-01 DIAGNOSIS — M25512 Pain in left shoulder: Secondary | ICD-10-CM | POA: Diagnosis not present

## 2019-12-01 DIAGNOSIS — R202 Paresthesia of skin: Secondary | ICD-10-CM | POA: Diagnosis not present

## 2019-12-01 NOTE — ED Provider Notes (Signed)
[facility] Emergency Department Provider Note ____________________________________________  Time seen: 2044  I have reviewed the triage vital signs and the nursing notes.  HISTORY  Chief Complaint  cramping  HPI Melissa Buck is a 69 y.o. female presents herself to the ED for evaluation of episodic cramping to the bilateral hands and arms, as well as cramping across her chest that occurred yesterday after walking 2 miles.  Patient describes she walks regularly daily with her dogs.  She had taken an extended break from walking secondary to the high temperatures and humidity over the last few summer months.  She reports her normal routine walk yesterday without subsequent shortness of breath, syncope, weakness.  She also denies any injury or trauma.  She had no bladder or bowel incontinence, foot drop, or saddle anesthesias.  She does report she woke up this morning with tingling down both legs.  She would also describe anterior thigh numbness bilaterally.  She notes that symptoms have been persistent for the last 6 to 8 hours, and only resolve spontaneously after she reported to the ED.  She again denies any weakness in the lower extremities, sensation changes beyond those reported, or any peripheral edema.  Patient is denied any cough, hemoptysis, visual disturbance, facial droop, or dysphagia.  She gives a remote history of anterior cervical decompression and fusion surgery, but denies any similar DDD or HNP radicular symptoms in the lower legs.  She is scheduled to see her  primary care provider tomorrow for symptom evaluation.  She denies any current symptoms including pain, weakness, or paresthesias at this time, and is requesting to be discharged from the ED without any further intervention.  Past Medical History:  Diagnosis Date  . Arthritis    knees, ankles,   . Asthma   . Colitis   . COPD (chronic obstructive pulmonary disease) (HCC)   . GERD (gastroesophageal  reflux disease)   . Headache    history of migraines  . Wears contact lenses   . Wears dentures    upper full plate    Patient Active Problem List   Diagnosis Date Noted  . Personal history of colonic polyps   . Bronchitis 05/10/2015  . Dermatitis, eczematoid 03/10/2015  . D (diarrhea) 03/10/2015  . Difficulty hearing 03/10/2015  . Hypercholesteremia 03/10/2015  . Headache, migraine 03/10/2015  . Anxiety 11/14/2014  . DD (diverticular disease) 09/11/2006  . CAFL (chronic airflow limitation) (HCC) 07/01/2006  . Adaptation reaction 03/19/2002  . Allergic rhinitis 03/19/2002  . Cannot sleep 03/19/2002  . Current tobacco use 03/19/2002    Past Surgical History:  Procedure Laterality Date  . ABDOMINAL HYSTERECTOMY    . CERVICAL FUSION     C5 - C6 fusion  . COLONOSCOPY    . COLONOSCOPY WITH PROPOFOL N/A 12/11/2018   Procedure: COLONOSCOPY WITH PROPOFOL;  Surgeon: Midge Minium, MD;  Location[facility];  Service: Endoscopy;  Laterality: N/A;    Prior to Admission medications   Medication Sig Start Date End Date Taking? Authorizing Provider  albuterol (PROVENTIL HFA;VENTOLIN HFA) 108 (90 Base) MCG/ACT inhaler Inhale 2 puffs into the lungs every 6 (six) hours as needed for wheezing or shortness of breath. 04/06/18   Malva Limes, MD  Calcium Carbonate-Vit D-Min (CALCIUM 1200 PO) Take by mouth daily.     [provider]  cetirizine (ZYRTEC) 10 MG chewable tablet Chew 10 mg by mouth daily.    [provider]  hydrOXYzine (ATARAX/VISTARIL) 10 MG tablet TAKE  1 TABLET TWICE DAILY AS NEEDED 03/16/19   Trey Sailors, PA-C  Multiple Vitamin (MULTIVITAMIN) tablet Take 1 tablet by mouth daily.    [provider]  PARoxetine (PAXIL) 20 MG tablet TAKE 1 TABLET (20 MG TOTAL) BY MOUTH DAILY. 08/21/19 02/17/20  Trey Sailors, PA-C  Probiotic Product (PROBIOTIC DAILY PO) Take by mouth.    [provider]  simvastatin (ZOCOR) 20 MG tablet TAKE 1  TABLET AT BEDTIME 09/27/19   Osvaldo Angst M, PA-C  tiZANidine (ZANAFLEX) 4 MG tablet TAKE 1 TO 3 TABLETS BY MOUTH EVERY 8 HOURS AS NEEDED FOR HEADACHE. MAX OF 8 TABLETS PER DAY 04/27/19   Osvaldo Angst M, PA-C  traZODone (DESYREL) 50 MG tablet Take 0.5-1 tablets (25-50 mg total) by mouth at bedtime as needed for sleep. 04/12/19   Trey Sailors, PA-C  vitamin B-12 (CYANOCOBALAMIN) 1000 MCG tablet Take 1,000 mcg by mouth daily.    [provider]   Allergies Aspirin, Erythromycin, Hydrocodone-acetaminophen, and Sulfa antibiotics  Family History  Problem Relation Age of Onset  . Healthy Sister   . Heart disease Brother   . Breast cancer Maternal Aunt     Social History Social History   Tobacco Use  . Smoking status: Current Some Day Smoker    Packs/day: 0.25    Years: 50.00    Pack years: 12.50    Types: Cigarettes  . Smokeless tobacco: Never Used  . Tobacco comment: 4 a day  Substance Use Topics  . Alcohol use: Yes    Alcohol/week: 7.0 - 9.0 standard drinks    Types: 7 - 9 Glasses of wine per week  . Drug use: No    Review of Systems  Constitutional: Negative for fever. Eyes: Negative for visual changes. ENT: Negative for sore throat. Cardiovascular: Negative for chest pain. Respiratory: Negative for shortness of breath. Gastrointestinal: Negative for abdominal pain, vomiting and diarrhea. Genitourinary: Negative for dysuria. Musculoskeletal: Negative for back pain. Skin: Negative for rash. Neurological: Negative for headaches, focal weakness.  Reports bilateral upper extremity and bilateral lower extremity paresthesias or numbness. ____________________________________________  PHYSICAL EXAM:  VITAL SIGNS: ED Triage Vitals  Enc Vitals Group     BP 11/30/19 1704 114/66     Pulse Rate 11/30/19 1704 85     Resp 11/30/19 1704 16     Temp 11/30/19 1704 98.5 F (36.9 C)     Temp Source 11/30/19 1704 Oral     SpO2 11/30/19 1704 96 %     Weight 11/30/19  1705 140 lb (63.5 kg)     Height 11/30/19 1705 5\' 1"  (1.549 m)     Head Circumference --      Peak Flow --      Pain Score 11/30/19 1705 6     Pain Loc --      Pain Edu? --      Excl. in GC? --     Constitutional: Alert and oriented. Well appearing and in no distress. GCS = 15 Head: Normocephalic and atraumatic. Eyes: Conjunctivae are normal. PERRL. Normal extraocular movements Mouth/Throat: Mucous membranes are moist.  Neck: Supple. No thyromegaly.  No midline tenderness, spasm, deformity, or step-off. Cardiovascular: Normal rate, regular rhythm. Normal distal pulses. Respiratory: Normal respiratory effort. No wheezes/rales/rhonchi. Gastrointestinal: Soft and nontender. No distention. Musculoskeletal: Normal spinal alignment without midline tenderness, spasm, deformity, or step-off.  Normal composite fist bilaterally.  Normal flexion extension range of the lower extremities without deficit.  Nontender with normal range of  motion in all extremities.  Neurologic: Cranial nerves II through XII grossly intact.  Normal UE/LE DTRs bilaterally.  Normal entrance and opposition testing noted.  Negative supine straight leg raise bilaterally.  Normal toe dorsiflexion foot eversion on exam.  Normal gait without ataxia. Normal speech and language. No gross focal neurologic deficits are appreciated. Skin:  Skin is warm, dry and intact. No rash noted. Psychiatric: Mood and affect are normal. Patient exhibits appropriate insight and judgment. ____________________________________________   LABS (pertinent positives/negatives) Labs Reviewed  CBC - Abnormal; Notable for the following components:      Result Value   RBC 3.82 (*)    All other components within normal limits  COMPREHENSIVE METABOLIC PANEL - Abnormal; Notable for the following components:   Calcium 8.5 (*)    All other components within normal limits  TROPONIN I (HIGH SENSITIVITY)  ____________________________________________  EKG NSR  89 bpm Multiple PVCs PR interval 116 ms QRS duration 82 ms No STEMI ____________________________________________   RADIOLOGY  CT Head w/o CM IMPRESSION: 1. Generalized cerebral atrophy. 2. No acute intracranial abnormality. ____________________________________________  PROCEDURES  Procedures ____________________________________________  INITIAL IMPRESSION / ASSESSMENT AND PLAN / ED COURSE  DDX; TIA, CVA, spinal cord compression, myalgias/myospasms, electrolyte imbalance, Guillian-Barre, anxiety  Patient with ED evaluation of resolved symptoms that included paresthesias in cramping to the upper and lower extremities.  Patient is without history of trauma or other neurologic deficit by report or on exam.  Her exam is benign reassuring at this time.  No acute neuromotor deficit is appreciated.  Labs are reassuring as a show no acute electrolyte imbalance or white count elevation.  Initially had declining offer for MRI imaging of the head and spine.  She did agree to allow a CT scan at this time to rule out any acute intracranial bleed.  Patient is otherwise reporting that she will follow-up with her primary provider in 1 day as planned.  We discussed return precautions at this time, and patient was agreeable to follow-up as directed.  Return precautions have been discussed.  CLARICE ZULAUF was evaluated in Emergency Department on 12/01/2019 for the symptoms described in the history of present illness. She was evaluated in the context of the global COVID-19 pandemic, which necessitated consideration that the patient might be at risk for infection with the SARS-CoV-2 virus that causes COVID-19. Institutional protocols and algorithms that pertain to the evaluation of patients at risk for COVID-19 are in a state of rapid change based on information released by regulatory bodies including the CDC and federal and state organizations. These policies and algorithms were followed during the patient's  care in the ED. ____________________________________________  FINAL CLINICAL IMPRESSION(S) / ED DIAGNOSES  Final diagnoses:  Paresthesia of bilateral legs  Paresthesia of both hands      Karmen Stabs, Charlesetta Ivory, PA-C 12/01/19 2112    Phineas Semen, MD 12/02/19 [phone]

## 2019-12-01 NOTE — Progress Notes (Signed)
MyChart Video Visit    Virtual Visit via Video Note   This visit type was conducted due to national recommendations for restrictions regarding the COVID-19 Pandemic (e.g. social distancing) in an effort to limit this patient's exposure and mitigate transmission in our community. This patient is at least at moderate risk for complications without adequate follow up. This format is felt to be most appropriate for this patient at this time. Physical exam was limited by quality of the video and audio technology used for the visit.   Patient location: Home Provider location: Office   I discussed the limitations of evaluation and management by telemedicine and the availability of in person appointments. The patient expressed understanding and agreed to proceed.  Patient: Melissa Buck   DOB: [birth year]   69 y.o. Female  MRN: [id number] Visit Date: 12/01/2019  Today's healthcare provider: Trey Sailors, PA-C   Chief Complaint  Patient presents with  . Shoulder Pain   Subjective    HPI   Patient reports left shoulder pain ongoing for many year but worsening recently. She reports she had an 800 lb patient that she was cleaning and he rolled over onto her when she was doing that. Since then, her left shoulder occasionally pops out of its socket. She has not seen an orthopedic doctor.   Patient had an episode yesterday of parasthesias and weakness. She was seen in the ER and her CT scan was negative for acute abnormality. She also had CBC, CMET and troponin which are normal. She reports she is feeling back to her baseline. She is currently on simvastatin for HLD. She does not have HTN.    Medications: Outpatient Medications Prior to Visit  Medication Sig  . albuterol (PROVENTIL HFA;VENTOLIN HFA) 108 (90 Base) MCG/ACT inhaler Inhale 2 puffs into the lungs every 6 (six) hours as needed for wheezing or shortness of breath.  . Calcium Carbonate-Vit D-Min (CALCIUM 1200 PO) Take by mouth  daily.   . cetirizine (ZYRTEC) 10 MG chewable tablet Chew 10 mg by mouth daily.  . hydrOXYzine (ATARAX/VISTARIL) 10 MG tablet TAKE 1 TABLET TWICE DAILY AS NEEDED  . Multiple Vitamin (MULTIVITAMIN) tablet Take 1 tablet by mouth daily.  Marland Kitchen PARoxetine (PAXIL) 20 MG tablet TAKE 1 TABLET (20 MG TOTAL) BY MOUTH DAILY.  . Probiotic Product (PROBIOTIC DAILY PO) Take by mouth.  . simvastatin (ZOCOR) 20 MG tablet TAKE 1 TABLET AT BEDTIME  . tiZANidine (ZANAFLEX) 4 MG tablet TAKE 1 TO 3 TABLETS BY MOUTH EVERY 8 HOURS AS NEEDED FOR HEADACHE. MAX OF 8 TABLETS PER DAY  . traZODone (DESYREL) 50 MG tablet Take 0.5-1 tablets (25-50 mg total) by mouth at bedtime as needed for sleep.  . vitamin B-12 (CYANOCOBALAMIN) 1000 MCG tablet Take 1,000 mcg by mouth daily.   No facility-administered medications prior to visit.    Review of Systems    Objective    There were no vitals taken for this visit.   Physical Exam Constitutional:      Appearance: Normal appearance.  Pulmonary:     Effort: Pulmonary effort is normal. No respiratory distress.  Neurological:     Mental Status: She is alert.  Psychiatric:        Mood and Affect: Mood normal.        Behavior: Behavior normal.        Assessment & Plan    1. Chronic left shoulder pain  Likely 2/2 old injury, advised she needs to be seen  by orthopedics as this is likely a surgical fix.   - Ambulatory referral to Orthopedics  2. Dislocation of left shoulder joint, sequela   3. Paresthesia  Counseled this could represent TIA and we can work it up as such which includes aggressive risk factor modification, carotid dopplers, echo, etc. She is already on a statin. She declines this workup at this time.    No follow-ups on file.     I discussed the assessment and treatment plan with the patient. The patient was provided an opportunity to ask questions and all were answered. The patient agreed with the plan and demonstrated an understanding of the  instructions.   The patient was advised to call back or seek an in-person evaluation if the symptoms worsen or if the condition fails to improve as anticipated.  I spent 20 minutes dedicated to the care of this patient on the date of this encounter to include pre-visit review of records, face-to-face time with the patient discussing shoulder dislocation, and post visit ordering of testing.  ITrey Sailors, PA-C, have reviewed all documentation for this visit. The documentation on 12/01/19 for the exam, diagnosis, procedures, and orders are all accurate and complete.  The entirety of the information documented in the History of Present Illness, Review of Systems and Physical Exam were personally obtained by me. Portions of this information were initially documented by [facility] and reviewed by me for thoroughness and accuracy.    Maryella Shivers [facility] [phone] (phone) [phone] (fax)  [facility]

## 2019-12-01 NOTE — Telephone Encounter (Signed)
Hi [name],   I placed a referral for podiatry on 10/06/2019. Patient has not heard anything.   Melissa Buck

## 2019-12-06 NOTE — Telephone Encounter (Signed)
Office staff at [facility] states she will call pt to schedule appointment. I  also called pt and gave her contact information

## 2019-12-10 ENCOUNTER — Ambulatory Visit: Payer: Medicare HMO | Admitting: Podiatry

## 2019-12-24 ENCOUNTER — Other Ambulatory Visit: Payer: Self-pay | Admitting: Physician Assistant

## 2019-12-24 DIAGNOSIS — F419 Anxiety disorder, unspecified: Secondary | ICD-10-CM

## 2019-12-27 DIAGNOSIS — M7542 Impingement syndrome of left shoulder: Secondary | ICD-10-CM | POA: Diagnosis not present

## 2019-12-31 ENCOUNTER — Encounter: Payer: Self-pay | Admitting: Podiatry

## 2019-12-31 ENCOUNTER — Ambulatory Visit (INDEPENDENT_AMBULATORY_CARE_PROVIDER_SITE_OTHER): Payer: Medicare HMO

## 2019-12-31 ENCOUNTER — Other Ambulatory Visit: Payer: Self-pay

## 2019-12-31 ENCOUNTER — Ambulatory Visit (INDEPENDENT_AMBULATORY_CARE_PROVIDER_SITE_OTHER): Payer: Medicare HMO | Admitting: Podiatry

## 2019-12-31 DIAGNOSIS — L608 Other nail disorders: Secondary | ICD-10-CM

## 2019-12-31 DIAGNOSIS — S90852A Superficial foreign body, left foot, initial encounter: Secondary | ICD-10-CM

## 2019-12-31 DIAGNOSIS — M25872 Other specified joint disorders, left ankle and foot: Secondary | ICD-10-CM | POA: Diagnosis not present

## 2019-12-31 MED ORDER — GENTAMICIN SULFATE 0.1 % EX CREA: 1.0000 "application " | TOPICAL_CREAM | Freq: Two times a day (BID) | CUTANEOUS | 1 refills | Status: DC

## 2019-12-31 NOTE — Progress Notes (Signed)
   Subjective: Patient presents today for evaluation of pain to the bilateral second digit.  Patient is concerned for possible ingrown toenail.  She states that the toenails have been curved and digging into her skin for several years now.  It is very symptomatic and she would like the nails completely removed.  She was also told several years ago that she had a piece of glass in her foot.  She does get some intermittent stabbing sensations to the plantar aspect of the left foot intermittently.  She does recall cutting her foot open as a young child, however she does not recall any incident of glass.  She presents for further treatment evaluation  Past Medical History:  Diagnosis Date  . Arthritis    knees, ankles,   . Asthma   . Colitis   . COPD (chronic obstructive pulmonary disease) (HCC)   . GERD (gastroesophageal reflux disease)   . Headache    history of migraines  . Wears contact lenses   . Wears dentures    upper full plate    Objective:  General: Well developed, nourished, in no acute distress, alert and oriented x3   Dermatology: Skin is warm, dry and supple bilateral.  Medial and lateral borders of the bilateral second toes appears to be erythematous with evidence of an ingrowing nail.  Pincer nail deformity noted as well.  Pain on palpation noted to the border of the nail fold. The remaining nails appear unremarkable at this time. There are no open sores, lesions.  Vascular: Dorsalis Pedis artery and Posterior Tibial artery pedal pulses palpable. No lower extremity edema noted.   Neruologic: Grossly intact via light touch bilateral.  Musculoskeletal: Muscular strength within normal limits in all groups bilateral. Normal range of motion noted to all pedal and ankle joints.  Pain on palpation to the sesamoidal apparatus left  Radiographic exam: No evidence or radiolucency that would be consistent with glass is noted.  Skin marker placed at the area of pain.  X-rays otherwise  within normal limits.  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 Patient evaluated.  2. Discussed treatment alternatives and plan of care. Explained nail avulsion procedure and post procedure course to patient. 3. Patient opted for permanent total nail avulsion of the second digit bilateral.  4. Prior to procedure, local anesthesia infiltration utilized using 3 ml of a 50:50 mixture of 2% plain lidocaine and 0.5% plain marcaine in a normal hallux block fashion and a betadine prep performed.  5. Partial permanent nail avulsion with chemical matrixectomy performed using 3x30sec applications of phenol followed by alcohol flush.  6. Light dressing applied. 7.  Prescription for gentamicin cream applied daily  8.  Injection of 0.5 cc Celestone Soluspan injected into the sesamoidal apparatus left foot  9.  Return to clinic 2 weeks.  Felecia Shelling, DPM [facility]  Dr. Felecia Shelling, DPM    [address]                                        [place], [place] [address]                Office [phone]  Fax [phone]

## 2020-01-03 ENCOUNTER — Other Ambulatory Visit: Payer: Self-pay | Admitting: Podiatry

## 2020-01-03 DIAGNOSIS — M25872 Other specified joint disorders, left ankle and foot: Secondary | ICD-10-CM

## 2020-01-06 ENCOUNTER — Other Ambulatory Visit: Payer: Self-pay | Admitting: Physician Assistant

## 2020-01-06 DIAGNOSIS — F5101 Primary insomnia: Secondary | ICD-10-CM

## 2020-01-06 NOTE — Telephone Encounter (Signed)
Requested Prescriptions  Pending Prescriptions Disp Refills  . traZODone (DESYREL) 50 MG tablet [Pharmacy Med Name: TRAZODONE HYDROCHLORIDE 50 MG Tablet] 30 tablet 3    Sig: TAKE 0.5-1 TABLETS (25-50 MG TOTAL) BY MOUTH AT BEDTIME AS NEEDED FOR SLEEP.     Psychiatry: Antidepressants - Serotonin Modulator Passed - 01/06/2020  4:07 PM      Passed - Valid encounter within last 6 months    Recent Outpatient Visits          1 month ago Chronic left shoulder pain   [facility] Osvaldo Angst M, [place]   3 months ago Bilateral foot pain   [facility] Osvaldo Angst M, [place]   6 months ago Annual physical exam   [facility] Osvaldo Angst M, [place]   8 months ago Adjustment disorder with anxious mood   [facility] Os[place]do Angst M, [place]   11 months ago Anxiety   [facility] [place], Ricki Rodriguez [place], [place]

## 2020-01-11 ENCOUNTER — Ambulatory Visit (INDEPENDENT_AMBULATORY_CARE_PROVIDER_SITE_OTHER): Payer: Medicare HMO | Admitting: Podiatry

## 2020-01-11 ENCOUNTER — Other Ambulatory Visit: Payer: Self-pay

## 2020-01-11 DIAGNOSIS — M25872 Other specified joint disorders, left ankle and foot: Secondary | ICD-10-CM

## 2020-01-11 DIAGNOSIS — L608 Other nail disorders: Secondary | ICD-10-CM

## 2020-01-11 NOTE — Progress Notes (Signed)
   Subjective: 69 y.o. female presents today status post permanent nail avulsion procedure of the bilateral second digits that was performed on 12/31/2019. Patient states that she is feeling much better. She does have some redness with some continued drainage to the bilateral second toes. She also states that the injection to the left sesamoidal apparatus helped significantly. She no longer experiences pain or tenderness to this area  Past Medical History:  Diagnosis Date  . Arthritis    knees, ankles,   . Asthma   . Colitis   . COPD (chronic obstructive pulmonary disease) (HCC)   . GERD (gastroesophageal reflux disease)   . Headache    history of migraines  . Wears contact lenses   . Wears dentures    upper full plate    Objective: Skin is warm, dry and supple. Nail and respective nail bed appears to be healing appropriately. Open wound to the associated nail bed with a granular wound base and moderate amount of fibrotic tissue. Minimal drainage noted. Mild erythema around the periungual region likely due to phenol chemical matricectomy.  There is also negative pain associated to the sesamoidal apparatus of the left foot  Assessment: #1 postop permanent total nail matricectomy second digit bilateral #2 Sesamoiditis left-resolved  Plan of care: #1 patient was evaluated  #2 Light debridement of open wound was performed to the periungual border of the respective toe using a currette. Antibiotic ointment and Band-Aid was applied. #3 Continue antibiotic cream and daily Epsom salt soaks  #4 patient is to return to clinic 4 weeks   Felecia Shelling, DPM [facility]  Dr. Felecia Shelling, DPM    [address]                                        [place], [place] [address]                Office [phone]  Fax [phone]

## 2020-02-08 ENCOUNTER — Ambulatory Visit (INDEPENDENT_AMBULATORY_CARE_PROVIDER_SITE_OTHER): Payer: Medicare HMO | Admitting: Podiatry

## 2020-02-08 ENCOUNTER — Other Ambulatory Visit: Payer: Self-pay

## 2020-02-08 DIAGNOSIS — M25872 Other specified joint disorders, left ankle and foot: Secondary | ICD-10-CM

## 2020-02-08 NOTE — Progress Notes (Signed)
   Subjective: 69 y.o. female presents today status post permanent nail avulsion procedure of the bilateral second digits that was performed on 12/31/2019.  Patient states she is doing very well.  She has been applying antibiotic cream and a Band-Aid as instructed.  No new complaints at this time  Past Medical History:  Diagnosis Date  . Arthritis    knees, ankles,   . Asthma   . Colitis   . COPD (chronic obstructive pulmonary disease) (HCC)   . GERD (gastroesophageal reflux disease)   . Headache    history of migraines  . Wears contact lenses   . Wears dentures    upper full plate    Objective: Skin is warm, dry and supple. Nail and respective nail bed appears to be healing appropriately.  There continues to be some minimal fibrotic tissue noted to the base of the nail beds second digits bilateral minimal drainage noted.  Negative for any erythema around the periungual region.  No evidence of infection.  No malodor.  There is also negative pain associated to the sesamoidal apparatus of the left foot  Assessment: #1 postop permanent total nail matricectomy second digit bilateral #2 Sesamoiditis left-resolved  Plan of care: #1 patient was evaluated  #2 Light debridement of open wound was performed to the periungual border of the respective toe using a currette.  Discontinue antibiotic ointment.  Recommend Betadine ointment.  Betadine ointment was applied and provided today #3 Continue antibiotic cream and daily Epsom salt soaks  #4 patient is to return to clinic as needed   Felecia Shelling, DPM [facility]  Dr. Felecia Shelling, DPM    [address]                                        [place], [place] [address]                Office [phone]  Fax [phone]

## 2020-02-15 NOTE — Progress Notes (Signed)
Subjective:   Melissa Buck is a 69 y.o. female who presents for Medicare Annual (Subsequent) preventive examination.  I connected with Marilynn Latino today by telephone and verified that I am speaking with the correct person using two identifiers. Location patient: home Location provider: work Persons participating in the virtual visit: patient, provider.   I discussed the limitations, risks, security and privacy concerns of performing an evaluation and management service by telephone and the availability of in person appointments. I also discussed with the patient that there may be a patient responsible charge related to this service. The patient expressed understanding and verbally consented to this telephonic visit.    Interactive audio and video telecommunications were attempted between this provider and patient, however failed, due to patient having technical difficulties OR patient did not have access to video capability.  We continued and completed visit with audio only.   Review of Systems    N/A  Cardiac Risk Factors include: advanced age (>74men, >18 women);dyslipidemia;sedentary lifestyle     Objective:    There were no vitals filed for this visit. There is no height or weight on file to calculate BMI.  Advanced Directives 02/16/2020 11/30/2019 01/19/2019 12/11/2018 12/16/2017 05/26/2016 05/10/2015  Does Patient Have a Medical Advance Directive? No No No No No No No  Type of Diplomatic Services operational officer;Living will - - - - - -  Copy of Healthcare Power of Attorney in Chart? No - copy requested - - - - - -  Would patient like information on creating a medical advance directive? No - Patient declined - No - Patient declined Yes (MAU/Ambulatory/Procedural Areas - Information given) - Yes (ED - Information included in AVS) -    Current Medications (verified) Outpatient Encounter Medications as of 02/16/2020  Medication Sig  . albuterol (PROVENTIL  HFA;VENTOLIN HFA) 108 (90 Base) MCG/ACT inhaler Inhale 2 puffs into the lungs every 6 (six) hours as needed for wheezing or shortness of breath.  . Calcium Carbonate-Vit D-Min (CALCIUM 1200 PO) Take by mouth daily.   . cetirizine (ZYRTEC) 10 MG chewable tablet Chew 10 mg by mouth daily.  Marland Kitchen gentamicin cream (GARAMYCIN) 0.1 % Apply 1 application topically 2 (two) times daily.  . hydrOXYzine (ATARAX/VISTARIL) 10 MG tablet TAKE 1 TABLET TWICE DAILY AS NEEDED  . meloxicam (MOBIC) 15 MG tablet Take 15 mg by mouth daily. As needed  . Multiple Vitamin (MULTIVITAMIN) tablet Take 1 tablet by mouth daily.  . Probiotic Product (PROBIOTIC DAILY PO) Take by mouth.  . simvastatin (ZOCOR) 20 MG tablet TAKE 1 TABLET AT BEDTIME  . tiZANidine (ZANAFLEX) 4 MG tablet TAKE 1 TO 3 TABLETS BY MOUTH EVERY 8 HOURS AS NEEDED FOR HEADACHE. MAX OF 8 TABLETS PER DAY  . traZODone (DESYREL) 50 MG tablet TAKE 0.5-1 TABLETS (25-50 MG TOTAL) BY MOUTH AT BEDTIME AS NEEDED FOR SLEEP.  Marland Kitchen vitamin B-12 (CYANOCOBALAMIN) 1000 MCG tablet Take 1,000 mcg by mouth daily.  Marland Kitchen PARoxetine (PAXIL) 20 MG tablet TAKE 1 TABLET (20 MG TOTAL) BY MOUTH DAILY. (Patient not taking: Reported on 02/16/2020)   No facility-administered encounter medications on file as of 02/16/2020.    Allergies (verified) Aspirin, Erythromycin, Hydrocodone-acetaminophen, and Sulfa antibiotics   History: Past Medical History:  Diagnosis Date  . Arthritis    knees, ankles,   . Asthma   . Colitis   . COPD (chronic obstructive pulmonary disease) (HCC)   . GERD (gastroesophageal reflux disease)   . Headache    history  of migraines  . Hyperlipidemia   . Wears contact lenses   . Wears dentures    upper full plate   Past Surgical History:  Procedure Laterality Date  . ABDOMINAL HYSTERECTOMY    . CERVICAL FUSION     C5 - C6 fusion  . COLONOSCOPY    . COLONOSCOPY WITH PROPOFOL N/A 12/11/2018   Procedure: COLONOSCOPY WITH PROPOFOL;  Surgeon: Midge Minium, MD;   [facility];  Service: Endoscopy;  Laterality: N/A;  . TOENAIL EXCISION     Family History  Problem Relation Age of Onset  . Healthy Sister   . Heart disease Brother   . Breast cancer Maternal Aunt    Social History   Socioeconomic History  . Marital status: Divorced    Spouse name: Not on file  . Number of children: 3  . Years of education: Not on file  . Highest education level: Some college, no degree  Occupational History  . Occupation: retired  Tobacco Use  . Smoking status: Current Some Day Smoker    Packs/day: 0.25    Years: 50.00    Pack years: 12.50    Types: Cigarettes  . Smokeless tobacco: Never Used  . Tobacco comment: 3 a day  Vaping Use  . Vaping Use: Never used  Substance and Sexual Activity  . Alcohol use: Yes    Alcohol/week: 7.0 - 9.0 standard drinks    Types: 7 - 9 Glasses of wine per week  . Drug use: No  . Sexual activity: Not on file  Other Topics Concern  . Not on file  Social History Narrative  . Not on file   Social Determinants of Health   Financial Resource Strain: Low Risk   . Difficulty of Paying Living Expenses: Not hard at all  Food Insecurity: No Food Insecurity  . Worried About Programme researcher, broadcasting/film/video in the Last Year: Never true  . Ran Out of Food in the Last Year: Never true  Transportation Needs: No Transportation Needs  . Lack of Transportation (Medical): No  . Lack of Transportation (Non-Medical): No  Physical Activity: Inactive  . Days of Exercise per Week: 0 days  . Minutes of Exercise per Session: 0 min  Stress: No Stress Concern Present  . Feeling of Stress : Not at all  Social Connections: Moderately Isolated  . Frequency of Communication with Friends and Family: More than three times a week  . Frequency of Social Gatherings with Friends and Family: More than three times a week  . Attends Religious Services: Never  . Active Member of Clubs or Organizations: No  . Attends Banker Meetings:  Never  . Marital Status: Married    Tobacco Counseling Ready to quit: Yes Counseling given: No Comment: 3 a day   Clinical Intake:  Pre-visit preparation completed: Yes  Pain : No/denies pain     Nutritional Risks: None Diabetes: No  How often do you need to have someone help you when you read instructions, pamphlets, or other written materials from your doctor or pharmacy?: 1 - Never  Diabetic? No  Interpreter Needed?: No  Information entered by :: [facility], LPN   Activities of Daily Living In your present state of health, do you have any difficulty performing the following activities: 02/16/2020 07/08/2019  Hearing? N N  Vision? N N  Difficulty concentrating or making decisions? N N  Walking or climbing stairs? N N  Dressing or bathing? N N  Doing errands, shopping?  N N  Preparing Food and eating ? N -  Using the Toilet? N -  In the past six months, have you accidently leaked urine? Y -  Comment Occasionally- wears protection. -  Do you have problems with loss of bowel control? N -  Managing your Medications? N -  Managing your Finances? N -  Housekeeping or managing your Housekeeping? N -  Some recent data might be hidden    Patient Care Team: Maryella ShiversPollak, Adriana M, PA-C as PCP - General (Physician Assistant) Malva LimesFisher, Donald E, MD as Referring Physician (Family Medicine) Verne CarrowShade, James, OD (Optometry) Felecia ShellingEvans, Brent M, DPM as Consulting Physician (Podiatry) Deeann SaintMiller, Howard, MD (Orthopedic Surgery)  Indicate any recent Medical Services you may have received from other than [facility] providers in the past year (date may be approximate).     Assessment:   This is a routine wellness examination for Stanton KidneyDebra.  Hearing/Vision screen No exam data present  Dietary issues and exercise activities discussed: Current Exercise Habits: The patient does not participate in regular exercise at present, Exercise limited by: None identified  Goals    . Exercise 3x per week (30 min  per time)     Recommend to exercise for 3 days a week for at least 30 minutes at a time.     . Quit Smoking     Recommend to continue efforts to reduce smoking habits until no longer smoking.       Depression Screen PHQ 2/9 Scores 07/08/2019 04/12/2019 01/19/2019 10/06/2018 12/16/2017 07/15/2016 07/12/2015  PHQ - 2 Score 0 0 0 4 0 0 0  PHQ- 9 Score 2 3 - 11 2 0 -    Fall Risk Fall Risk  02/16/2020 07/08/2019 01/19/2019 12/16/2017  Falls in the past year? 0 0 1 Yes  Number falls in past yr: 0 0 1 2 or more  Injury with Fall? 0 0 0 Yes  Risk for fall due to : - - Impaired mobility -  Risk for fall due to: Comment - - Due to knee pain. Pt to follow up with PCP about knee concerns. -  Follow up - - Falls prevention discussed -    FALL RISK PREVENTION PERTAINING TO THE HOME:  Any stairs in or around the home? Yes  If so, are there any without handrails? No  Home free of loose throw rugs in walkways, pet beds, electrical cords, etc? Yes  Adequate lighting in your home to reduce risk of falls? Yes   ASSISTIVE DEVICES UTILIZED TO PREVENT FALLS:  Life alert? No  Use of a cane, walker or w/c? No  Grab bars in the bathroom? Yes  Shower chair or bench in shower? No  Elevated toilet seat or a handicapped toilet? Yes    Cognitive Function: Normal cognitive status assessed by observation by this Nurse Health Advisor. No abnormalities found.       6CIT Screen 01/19/2019  What Year? 0 points  What month? 0 points  What time? 0 points  Count back from 20 0 points  Months in reverse 0 points  Repeat phrase 0 points  Total Score 0    Immunizations Immunization History  Administered Date(s) Administered  . Influenza, High Dose Seasonal PF 12/16/2017  . Pneumococcal Conjugate-13 12/16/2017  . Pneumococcal Polysaccharide-23 07/08/2019  . Td 09/15/2003  . Tdap 07/18/2016    TDAP status: Up to date  Flu Vaccine status: Due, Education has been provided regarding the importance of this  vaccine. Advised may receive this  vaccine at local pharmacy or Health Dept. Aware to provide a copy of the vaccination record if obtained from local pharmacy or Health Dept. Verbalized acceptance and understanding.  Pneumococcal vaccine status: Up to date  Covid-19 vaccine status: Completed vaccines  Qualifies for Shingles Vaccine? Yes   Zostavax completed No   Shingrix Completed?: No.    Education has been provided regarding the importance of this vaccine. Patient has been advised to call insurance company to determine out of pocket expense if they have not yet received this vaccine. Advised may also receive vaccine at local pharmacy or Health Dept. Verbalized acceptance and understanding.  Screening Tests Health Maintenance  Topic Date Due  . COVID-19 Vaccine (1) Never done  . INFLUENZA VACCINE  10/03/2019  . MAMMOGRAM  02/12/2020  . DEXA SCAN  01/27/2023  . COLONOSCOPY  12/11/2023  . TETANUS/TDAP  07/19/2026  . Hepatitis C Screening  Completed  . PNA vac Low Risk Adult  Completed    Health Maintenance  Health Maintenance Due  Topic Date Due  . COVID-19 Vaccine (1) Never done  . INFLUENZA VACCINE  10/03/2019  . MAMMOGRAM  02/12/2020    Colorectal cancer screening: Type of screening: Colonoscopy. Completed 12/11/18. Repeat every 5 years  Mammogram status: Ordered 07/08/19. Pt provided with contact info and advised to call to schedule appt.   Bone Density status: Completed 01/26/18. Results reflect: Bone density results: OSTEOPENIA. Repeat every 5 years.  Lung Cancer Screening: (Low Dose CT Chest recommended if Age 51-80 years, 30 pack-year currently smoking OR have quit w/in 15years.) does qualify however had this completed 11/30/19. Repeat yearly.  Additional Screening:  Hepatitis C Screening: Up to date  Vision Screening: Recommended annual ophthalmology exams for early detection of glaucoma and other disorders of the eye. Is the patient up to date with their annual eye  exam?  Yes  Who is the provider or what is the name of the office in which the patient attends annual eye exams? Dr Clearance Coots If pt is not established with a provider, would they like to be referred to a provider to establish care? No .   Dental Screening: Recommended annual dental exams for proper oral hygiene  Community Resource Referral / Chronic Care Management: CRR required this visit?  No   CCM required this visit?  No      Plan:     I have personally reviewed and noted the following in the patient's chart:   . Medical and social history . Use of alcohol, tobacco or illicit drugs  . Current medications and supplements . Functional ability and status . Nutritional status . Physical activity . Advanced directives . List of other physicians . Hospitalizations, surgeries, and ER visits in previous 12 months . Vitals . Screenings to include cognitive, depression, and falls . Referrals and appointments  In addition, I have reviewed and discussed with patient certain preventive protocols, quality metrics, and best practice recommendations. A written personalized care plan for preventive services as well as general preventive health recommendations were provided to patient.     Mindy Gali [place], [place]   89/21/1940   Nurse Notes: Pt plans to receive a flu shot at her local pharmacy. Pt has received both Covid shots. Requested that she bring the vaccine card into office so we can up date in chart.

## 2020-02-16 ENCOUNTER — Ambulatory Visit (INDEPENDENT_AMBULATORY_CARE_PROVIDER_SITE_OTHER): Payer: Medicare HMO

## 2020-02-16 ENCOUNTER — Other Ambulatory Visit: Payer: Self-pay

## 2020-02-16 DIAGNOSIS — Z0000 Encounter for general adult medical examination without abnormal findings: Secondary | ICD-10-CM

## 2020-02-16 NOTE — Patient Instructions (Signed)
Ms. Melissa Buck , Thank you for taking time to come for your Medicare Wellness Visit. I appreciate your ongoing commitment to your health goals. Please review the following plan we discussed and let me know if I can assist you in the future.   Screening recommendations/referrals: Colonoscopy: Up to date, due 12/2023 Mammogram: Currently due. Order on file. Please call [phone] to schedule your mammogram.  Bone Density: Up to date, due 01/2023 Recommended yearly ophthalmology/optometry visit for glaucoma screening and checkup Recommended yearly dental visit for hygiene and checkup  Vaccinations: Influenza vaccine: Currently due, patient to receive at pharmacy.  Pneumococcal vaccine: Completed series Tdap vaccine: Up to date, due 07/2026 Shingles vaccine: Shingrix discussed. Please contact your pharmacy for coverage information.     Advanced directives: Advance directive discussed with you today. Even though you declined this today please call our office should you change your mind and we can give you the proper paperwork for you to fill out.  Conditions/risks identified: Smoking cessation discussed today. Recommend to start walking 3 days a week for at least 30 minutes at a time.   Next appointment: 02/19/21 @ 1:20 PM for an AWV. Declined scheduling a follow up with PCP at this time.    Preventive Care 22 Years and Older, Female Preventive care refers to lifestyle choices and visits with your health care provider that can promote health and wellness. What does preventive care include?  A yearly physical exam. This is also called an annual well check.  Dental exams once or twice a year.  Routine eye exams. Ask your health care provider how often you should have your eyes checked.  Personal lifestyle choices, including:  Daily care of your teeth and gums.  Regular physical activity.  Eating a healthy diet.  Avoiding tobacco and drug use.  Limiting alcohol use.  Practicing  safe sex.  Taking low-dose aspirin every day.  Taking vitamin and mineral supplements as recommended by your health care provider. What happens during an annual well check? The services and screenings done by your health care provider during your annual well check will depend on your age, overall health, lifestyle risk factors, and family history of disease. Counseling  Your health care provider may ask you questions about your:  Alcohol use.  Tobacco use.  Drug use.  Emotional well-being.  Home and relationship well-being.  Sexual activity.  Eating habits.  History of falls.  Memory and ability to understand (cognition).  Work and work Astronomer.  Reproductive health. Screening  You may have the following tests or measurements:  Height, weight, and BMI.  Blood pressure.  Lipid and cholesterol levels. These may be checked every 5 years, or more frequently if you are over 45 years old.  Skin check.  Lung cancer screening. You may have this screening every year starting at age 40 if you have a 30-pack-year history of smoking and currently smoke or have quit within the past 15 years.  Fecal occult blood test (FOBT) of the stool. You may have this test every year starting at age 42.  Flexible sigmoidoscopy or colonoscopy. You may have a sigmoidoscopy every 5 years or a colonoscopy every 10 years starting at age 65.  Hepatitis C blood test.  Hepatitis B blood test.  Sexually transmitted disease (STD) testing.  Diabetes screening. This is done by checking your blood sugar (glucose) after you have not eaten for a while (fasting). You may have this done every 1-3 years.  Bone density scan. This is done  to screen for osteoporosis. You may have this done starting at age 52.  Mammogram. This may be done every 1-2 years. Talk to your health care provider about how often you should have regular mammograms. Talk with your health care provider about your test results,  treatment options, and if necessary, the need for more tests. Vaccines  Your health care provider may recommend certain vaccines, such as:  Influenza vaccine. This is recommended every year.  Tetanus, diphtheria, and acellular pertussis (Tdap, Td) vaccine. You may need a Td booster every 10 years.  Zoster vaccine. You may need this after age 60.  Pneumococcal 13-valent conjugate (PCV13) vaccine. One dose is recommended after age 44.  Pneumococcal polysaccharide (PPSV23) vaccine. One dose is recommended after age 53. Talk to your health care provider about which screenings and vaccines you need and how often you need them. This information is not intended to replace advice given to you by your health care provider. Make sure you discuss any questions you have with your health care provider. Document Released: 03/17/2015 Document Revised: 11/08/2015 Document Reviewed: 12/20/2014 Elsevier Interactive Patient Education  2017 [employer] Prevention in the Home Falls can cause injuries. They can happen to people of all ages. There are many things you can do to make your home safe and to help prevent falls. What can I do on the outside of my home?  Regularly fix the edges of walkways and driveways and fix any cracks.  Remove anything that might make you trip as you walk through a door, such as a raised step or threshold.  Trim any bushes or trees on the path to your home.  Use bright outdoor lighting.  Clear any walking paths of anything that might make someone trip, such as rocks or tools.  Regularly check to see if handrails are loose or broken. Make sure that both sides of any steps have handrails.  Any raised decks and porches should have guardrails on the edges.  Have any leaves, snow, or ice cleared regularly.  Use sand or salt on walking paths during winter.  Clean up any spills in your garage right away. This includes oil or grease spills. What can I do in the  bathroom?  Use night lights.  Install grab bars by the toilet and in the tub and shower. Do not use towel bars as grab bars.  Use non-skid mats or decals in the tub or shower.  If you need to sit down in the shower, use a plastic, non-slip stool.  Keep the floor dry. Clean up any water that spills on the floor as soon as it happens.  Remove soap buildup in the tub or shower regularly.  Attach bath mats securely with double-sided non-slip rug tape.  Do not have throw rugs and other things on the floor that can make you trip. What can I do in the bedroom?  Use night lights.  Make sure that you have a light by your bed that is easy to reach.  Do not use any sheets or blankets that are too big for your bed. They should not hang down onto the floor.  Have a firm chair that has side arms. You can use this for support while you get dressed.  Do not have throw rugs and other things on the floor that can make you trip. What can I do in the kitchen?  Clean up any spills right away.  Avoid walking on wet floors.  Keep items  that you use a lot in easy-to-reach places.  If you need to reach something above you, use a strong step stool that has a grab bar.  Keep electrical cords out of the way.  Do not use floor polish or wax that makes floors slippery. If you must use wax, use non-skid floor wax.  Do not have throw rugs and other things on the floor that can make you trip. What can I do with my stairs?  Do not leave any items on the stairs.  Make sure that there are handrails on both sides of the stairs and use them. Fix handrails that are broken or loose. Make sure that handrails are as long as the stairways.  Check any carpeting to make sure that it is firmly attached to the stairs. Fix any carpet that is loose or worn.  Avoid having throw rugs at the top or bottom of the stairs. If you do have throw rugs, attach them to the floor with carpet tape.  Make sure that you have a  light switch at the top of the stairs and the bottom of the stairs. If you do not have them, ask someone to add them for you. What else can I do to help prevent falls?  Wear shoes that:  Do not have high heels.  Have rubber bottoms.  Are comfortable and fit you well.  Are closed at the toe. Do not wear sandals.  If you use a stepladder:  Make sure that it is fully opened. Do not climb a closed stepladder.  Make sure that both sides of the stepladder are locked into place.  Ask someone to hold it for you, if possible.  Clearly mark and make sure that you can see:  Any grab bars or handrails.  First and last steps.  Where the edge of each step is.  Use tools that help you move around (mobility aids) if they are needed. These include:  Canes.  Walkers.  Scooters.  Crutches.  Turn on the lights when you go into a dark area. Replace any light bulbs as soon as they burn out.  Set up your furniture so you have a clear path. Avoid moving your furniture around.  If any of your floors are uneven, fix them.  If there are any pets around you, be aware of where they are.  Review your medicines with your doctor. Some medicines can make you feel dizzy. This can increase your chance of falling. Ask your doctor what other things that you can do to help prevent falls. This information is not intended to replace advice given to you by your health care provider. Make sure you discuss any questions you have with your health care provider. Document Released: 12/15/2008 Document Revised: 07/27/2015 Document Reviewed: 03/25/2014 Elsevier Interactive Patient Education  2017 [employer].

## 2020-02-22 ENCOUNTER — Telehealth: Payer: Self-pay | Admitting: *Deleted

## 2020-02-22 ENCOUNTER — Encounter: Payer: Self-pay | Admitting: *Deleted

## 2020-02-22 NOTE — Telephone Encounter (Signed)
Attempted to contact to schedule lung screening scan. However there is no answer or voicemail option. Will mail letter.

## 2020-03-09 ENCOUNTER — Telehealth: Payer: Self-pay | Admitting: Podiatry

## 2020-03-09 NOTE — Telephone Encounter (Signed)
The patient left a voicemail stating that she is out of the medication with the iodine and her toe is oozing. She wanted to get more of that medication.

## 2020-03-28 ENCOUNTER — Other Ambulatory Visit: Payer: Self-pay

## 2020-03-28 ENCOUNTER — Ambulatory Visit
Admission: RE | Admit: 2020-03-28 | Discharge: 2020-03-28 | Disposition: A | Discharge status: Home / Self Care | Payer: Medicare HMO | Source: Ambulatory Visit | Attending: Physician Assistant | Admitting: Physician Assistant

## 2020-03-28 DIAGNOSIS — Z1231 Encounter for screening mammogram for malignant neoplasm of breast: Secondary | ICD-10-CM | POA: Diagnosis not present

## 2020-04-14 ENCOUNTER — Telehealth: Payer: Self-pay | Admitting: *Deleted

## 2020-04-14 NOTE — Telephone Encounter (Signed)
Copied from CRM [id number]. Topic: General - Inquiry >> Apr 14, 2020 10:17 AM Crist Infante wrote: Reason for CRM: pt states she got something in the mail from [facility] to do some sort of screening for her arteries, plague build up, heart rhythm screening etc. Pt wants Adriana to call her and advise if she should do this. It is $159.00. please advise Pt states she smokes 3 cigarettes/day , and when she went ED they told her it looked like she had a mini stroke.  So she is owndering should she do this?

## 2020-04-14 NOTE — Telephone Encounter (Signed)
She can if she'd like. However, if she had a TIA there is a larger workup that we discussed back in September and ultrasounds of her carotids may be covered under it. We can discuss at her next visit or if she wants to come in sooner.

## 2020-04-14 NOTE — Telephone Encounter (Signed)
Patient was advised and scheduled appointment for 04/24/2020.

## 2020-04-20 ENCOUNTER — Other Ambulatory Visit: Payer: Self-pay | Admitting: Physician Assistant

## 2020-04-20 DIAGNOSIS — F5101 Primary insomnia: Secondary | ICD-10-CM

## 2020-04-20 NOTE — Telephone Encounter (Signed)
Future visit in 4 days  

## 2020-04-24 ENCOUNTER — Ambulatory Visit: Payer: Self-pay | Admitting: Physician Assistant

## 2020-04-27 ENCOUNTER — Telehealth (INDEPENDENT_AMBULATORY_CARE_PROVIDER_SITE_OTHER): Payer: Medicare HMO | Admitting: Physician Assistant

## 2020-04-27 ENCOUNTER — Encounter: Payer: Self-pay | Admitting: Physician Assistant

## 2020-04-27 DIAGNOSIS — F419 Anxiety disorder, unspecified: Secondary | ICD-10-CM

## 2020-04-27 DIAGNOSIS — Z716 Tobacco abuse counseling: Secondary | ICD-10-CM

## 2020-04-27 MED ORDER — BUPROPION HCL ER (SR) 150 MG PO TB12: ORAL_TABLET | ORAL | 0 refills | Status: DC

## 2020-04-27 NOTE — Progress Notes (Signed)
MyChart Video Visit    Virtual Visit via Video Note   This visit type was conducted due to national recommendations for restrictions regarding the COVID-19 Pandemic (e.g. social distancing) in an effort to limit this patient's exposure and mitigate transmission in our community. This patient is at least at moderate risk for complications without adequate follow up. This format is felt to be most appropriate for this patient at this time. Physical exam was limited by quality of the video and audio technology used for the visit.   Patient location: Home Provider location: [place]  I discussed the limitations of evaluation and management by telemedicine and the availability of in person appointments. The patient expressed understanding and agreed to proceed.  Patient: Melissa Buck   DOB: 1950-08-12   70 y.o. Female  MRN: [id number] Visit Date: 04/27/2020  Today's healthcare provider: Trey Sailors, PA-C   Chief Complaint  Patient presents with  . Nicotine Dependence  I,Zakara Parkey M Dajanae Brophy,acting as a scribe for Trey Sailors, PA-C.,have documented all relevant documentation on the behalf of Trey Sailors, PA-C,as directed by  Trey Sailors, PA-C while in the presence of Trey Sailors, PA-C.  Subjective    HPI  Nicotine Dependence Patient presents today for trying to stop smoking cigarettes after smoking for 53 years. She reports that she tried nicotine patches and they were too strong. She reports only smoking 3 to 4 cigarettes a day. Reports feeling very irritable. Reports chantix will be too expensive. Declines gabapentin.     Medications: Outpatient Medications Prior to Visit  Medication Sig  . albuterol (PROVENTIL HFA;VENTOLIN HFA) 108 (90 Base) MCG/ACT inhaler Inhale 2 puffs into the lungs every 6 (six) hours as needed for wheezing or shortness of breath.  . Calcium Carbonate-Vit D-Min (CALCIUM 1200 PO) Take by mouth daily.   . cetirizine (ZYRTEC) 10 MG  chewable tablet Chew 10 mg by mouth daily.  Marland Kitchen gentamicin cream (GARAMYCIN) 0.1 % Apply 1 application topically 2 (two) times daily.  . hydrOXYzine (ATARAX/VISTARIL) 10 MG tablet TAKE 1 TABLET TWICE DAILY AS NEEDED  . meloxicam (MOBIC) 15 MG tablet Take 15 mg by mouth daily. As needed  . Multiple Vitamin (MULTIVITAMIN) tablet Take 1 tablet by mouth daily.  . Probiotic Product (PROBIOTIC DAILY PO) Take by mouth.  . simvastatin (ZOCOR) 20 MG tablet TAKE 1 TABLET AT BEDTIME  . tiZANidine (ZANAFLEX) 4 MG tablet TAKE 1 TO 3 TABLETS BY MOUTH EVERY 8 HOURS AS NEEDED FOR HEADACHE. MAX OF 8 TABLETS PER DAY  . traZODone (DESYREL) 50 MG tablet TAKE 1/2 TO 1 TABLET (25-50 MG TOTAL) BY MOUTH AT BEDTIME AS NEEDED FOR SLEEP.  Marland Kitchen vitamin B-12 (CYANOCOBALAMIN) 1000 MCG tablet Take 1,000 mcg by mouth daily.  . [DISCONTINUED] PARoxetine (PAXIL) 20 MG tablet TAKE 1 TABLET (20 MG TOTAL) BY MOUTH DAILY. (Patient not taking: Reported on 02/16/2020)   No facility-administered medications prior to visit.    Review of Systems    Objective    There were no vitals taken for this visit.   Physical Exam Constitutional:      Appearance: Normal appearance.  Pulmonary:     Effort: Pulmonary effort is normal. No respiratory distress.  Neurological:     Mental Status: She is alert.  Psychiatric:        Mood and Affect: Mood normal.        Behavior: Behavior normal.        Assessment & Plan  1. Anxiety  Previously on Paxil. Doesn't want to restart this.   2. Encounter for tobacco use cessation counseling  Counseled > 3 minutes on tobacco cessation.   - buPROPion (WELLBUTRIN SR) 150 MG 12 hr tablet; 1 tablet daily for 3 days, then 1 tablet twice daily. Stop smoking 14 days after starting medication  Dispense: 180 tablet; Refill: 0   Return if symptoms worsen or fail to improve.     I discussed the assessment and treatment plan with the patient. The patient was provided an opportunity to ask questions  and all were answered. The patient agreed with the plan and demonstrated an understanding of the instructions.   The patient was advised to call back or seek an in-person evaluation if the symptoms worsen or if the condition fails to improve as anticipated.   ITrey Sailors, PA-C, have reviewed all documentation for this visit. The documentation on 04/28/20 for the exam, diagnosis, procedures, and orders are all accurate and complete.  The entirety of the information documented in the History of Present Illness, Review of Systems and Physical Exam were personally obtained by me. Portions of this information were initially documented by [facility] and reviewed by me for thoroughness and accuracy.    Maryella Shivers [facility] [phone] (phone) [phone] (fax)  [facility]

## 2020-05-18 DIAGNOSIS — H4423 Degenerative myopia, bilateral: Secondary | ICD-10-CM | POA: Diagnosis not present

## 2020-05-18 DIAGNOSIS — H25013 Cortical age-related cataract, bilateral: Secondary | ICD-10-CM | POA: Diagnosis not present

## 2020-05-18 DIAGNOSIS — H5213 Myopia, bilateral: Secondary | ICD-10-CM | POA: Diagnosis not present

## 2020-05-18 DIAGNOSIS — Z0100 Encounter for examination of eyes and vision without abnormal findings: Secondary | ICD-10-CM | POA: Diagnosis not present

## 2020-05-18 DIAGNOSIS — H2513 Age-related nuclear cataract, bilateral: Secondary | ICD-10-CM | POA: Diagnosis not present

## 2020-07-11 ENCOUNTER — Other Ambulatory Visit: Payer: Self-pay

## 2020-07-11 ENCOUNTER — Encounter: Payer: Self-pay | Admitting: Family Medicine

## 2020-07-11 ENCOUNTER — Ambulatory Visit (INDEPENDENT_AMBULATORY_CARE_PROVIDER_SITE_OTHER): Payer: Medicare HMO | Admitting: Family Medicine

## 2020-07-11 DIAGNOSIS — F5101 Primary insomnia: Secondary | ICD-10-CM

## 2020-07-11 DIAGNOSIS — F32A Depression, unspecified: Secondary | ICD-10-CM

## 2020-07-11 DIAGNOSIS — F419 Anxiety disorder, unspecified: Secondary | ICD-10-CM | POA: Diagnosis not present

## 2020-07-11 MED ORDER — ESCITALOPRAM OXALATE 20 MG PO TABS: 20.0000 mg | ORAL_TABLET | Freq: Every day | ORAL | 2 refills | Status: DC

## 2020-07-11 NOTE — Progress Notes (Signed)
Virtual telephone visit    Virtual Visit via Telephone Note   This visit type was conducted due to national recommendations for restrictions regarding the COVID-19 Pandemic (e.g. social distancing) in an effort to limit this patient's exposure and mitigate transmission in our community. Due to her co-morbid illnesses, this patient is at least at moderate risk for complications without adequate follow up. This format is felt to be most appropriate for this patient at this time. The patient did not have access to video technology or had technical difficulties with video requiring transitioning to audio format only (telephone). Physical exam was limited to content and character of the telephone converstion.    Patient location: home Provider location: office  I discussed the limitations of evaluation and management by telemedicine and the availability of in person appointments. The patient expressed understanding and agreed to proceed.   Visit Date: 07/11/2020  Today's healthcare provider: Dortha Kern, PA-C   No chief complaint on file.  Subjective    HPI  Patient is a 70 year old female who presents via phone visit for evaluation of depression.  She states she feels depressed, angry one moment and extremely emotional the next.  She has crying spells and complains of being hot and col.  She reports this has been going on for about  1 year.      Past Medical History:  Diagnosis Date  . Arthritis    knees, ankles,   . Asthma   . Colitis   . COPD (chronic obstructive pulmonary disease) (HCC)   . GERD (gastroesophageal reflux disease)   . Headache    history of migraines  . Hyperlipidemia   . Wears contact lenses   . Wears dentures    upper full plate   Patient Active Problem List   Diagnosis Date Noted  . Personal history of colonic polyps   . Intractable migraine with aura without status migrainosus 09/22/2016  . Bronchitis 05/10/2015  . Dermatitis, eczematoid  03/10/2015  . D (diarrhea) 03/10/2015  . Difficulty hearing 03/10/2015  . Hypercholesteremia 03/10/2015  . Headache, migraine 03/10/2015  . Anxiety 11/14/2014  . DD (diverticular disease) 09/11/2006  . CAFL (chronic airflow limitation) (HCC) 07/01/2006  . Adaptation reaction 03/19/2002  . Allergic rhinitis 03/19/2002  . Cannot sleep 03/19/2002  . Current tobacco use 03/19/2002   Past Surgical History:  Procedure Laterality Date  . ABDOMINAL HYSTERECTOMY    . CERVICAL FUSION     C5 - C6 fusion  . COLONOSCOPY    . COLONOSCOPY WITH PROPOFOL N/A 12/11/2018   Procedure: COLONOSCOPY WITH PROPOFOL;  Surgeon: Midge Minium, MD;  Location: [facility];  Service: Endoscopy;  Laterality: N/A;  . TOENAIL EXCISION     Family History  Problem Relation Age of Onset  . Healthy Sister   . Heart disease Brother   . Breast cancer Maternal Aunt    Social History   Tobacco Use  . Smoking status: Current Some Day Smoker    Packs/day: 0.25    Years: 50.00    Pack years: 12.50    Types: Cigarettes  . Smokeless tobacco: Never Used  . Tobacco comment: 3 a day  Vaping Use  . Vaping Use: Never used  Substance Use Topics  . Alcohol use: Yes    Alcohol/week: 7.0 - 9.0 standard drinks    Types: 7 - 9 Glasses of wine per week  . Drug use: No   Allergies  Allergen Reactions  . Aspirin   .  Erythromycin     Allergic to Mycins.  . Hydrocodone-Acetaminophen Itching    GI Upset  . Sulfa Antibiotics     Throat closes up    Medications: Outpatient Medications Prior to Visit  Medication Sig  . albuterol (PROVENTIL HFA;VENTOLIN HFA) 108 (90 Base) MCG/ACT inhaler Inhale 2 puffs into the lungs every 6 (six) hours as needed for wheezing or shortness of breath.  . meloxicam (MOBIC) 15 MG tablet Take 15 mg by mouth daily. As needed  . simvastatin (ZOCOR) 20 MG tablet TAKE 1 TABLET AT BEDTIME  . tiZANidine (ZANAFLEX) 4 MG tablet TAKE 1 TO 3 TABLETS BY MOUTH EVERY 8 HOURS AS NEEDED FOR HEADACHE.  MAX OF 8 TABLETS PER DAY  . traZODone (DESYREL) 50 MG tablet TAKE 1/2 TO 1 TABLET (25-50 MG TOTAL) BY MOUTH AT BEDTIME AS NEEDED FOR SLEEP.  Marland Kitchen buPROPion (WELLBUTRIN SR) 150 MG 12 hr tablet 1 tablet daily for 3 days, then 1 tablet twice daily. Stop smoking 14 days after starting medication (Patient not taking: Reported on 07/11/2020)  . Calcium Carbonate-Vit D-Min (CALCIUM 1200 PO) Take by mouth daily.  (Patient not taking: Reported on 07/11/2020)  . cetirizine (ZYRTEC) 10 MG chewable tablet Chew 10 mg by mouth daily. (Patient not taking: Reported on 07/11/2020)  . gentamicin cream (GARAMYCIN) 0.1 % Apply 1 application topically 2 (two) times daily. (Patient not taking: Reported on 07/11/2020)  . hydrOXYzine (ATARAX/VISTARIL) 10 MG tablet TAKE 1 TABLET TWICE DAILY AS NEEDED (Patient not taking: Reported on 07/11/2020)  . Multiple Vitamin (MULTIVITAMIN) tablet Take 1 tablet by mouth daily. (Patient not taking: Reported on 07/11/2020)  . Probiotic Product (PROBIOTIC DAILY PO) Take by mouth. (Patient not taking: Reported on 07/11/2020)  . vitamin B-12 (CYANOCOBALAMIN) 1000 MCG tablet Take 1,000 mcg by mouth daily. (Patient not taking: Reported on 07/11/2020)   No facility-administered medications prior to visit.    Review of Systems  Respiratory: Positive for shortness of breath. Negative for wheezing.   Cardiovascular: Positive for palpitations. Negative for chest pain and leg swelling.  Psychiatric/Behavioral: Positive for agitation, behavioral problems, decreased concentration, dysphoric mood and sleep disturbance. Negative for confusion, hallucinations, self-injury and suicidal ideas. The patient is nervous/anxious. The patient is not hyperactive.        Objective    There were no vitals taken for this visit.   During telephonic interview, no apparent respiratory distress. Seems anxious and near tears.   Assessment & Plan    1. Anxiety and depression Over the past couple months having  irritability, sadness and anxiety. Was on Lexapro in the past and wants to try again. No suicidal ideation today. Recheck in 2 weeks. Some hot flashes and night sweats since menses stopped years ago. - escitalopram (LEXAPRO) 20 MG tablet; Take 1 tablet (20 mg total) by mouth daily.  Dispense: 30 tablet; Refill: 2  2. Primary insomnia Not sleeping well. Using Trazodone 50 mg 1-2 tablets hs.    No follow-ups on file.    I discussed the assessment and treatment plan with the patient. The patient was provided an opportunity to ask questions and all were answered. The patient agreed with the plan and demonstrated an understanding of the instructions.   The patient was advised to call back or seek an in-person evaluation if the symptoms worsen or if the condition fails to improve as anticipated.  I provided 20 minutes of non-face-to-face time during this encounter.  I, Marrell Dicaprio, PA-C, have reviewed all documentation for this visit.  The documentation on 07/11/20 for the exam, diagnosis, procedures, and orders are all accurate and complete.   Dortha Kern, PA-C [employer] [phone] (phone) [phone] (fax)  [facility]

## 2020-08-02 ENCOUNTER — Other Ambulatory Visit: Payer: Self-pay

## 2020-08-02 DIAGNOSIS — F5101 Primary insomnia: Secondary | ICD-10-CM

## 2020-08-03 ENCOUNTER — Telehealth: Payer: Self-pay

## 2020-08-03 NOTE — Telephone Encounter (Signed)
Sent a prescription to her pharmacy on 07-11-20 with 2 refills.

## 2020-08-03 NOTE — Telephone Encounter (Signed)
Copied from CRM [id number]. Topic: General - Other >> Aug 03, 2020 10:31 AM Gaetana Michaelis A wrote: Reason for CRM: Patient called to share with Prov. Chrismon that their escitalopram (LEXAPRO) 20 MG tablet prescription is effective and they would like to continue taking the medication   The patient has 8 (eight) tablets remaining and would like to coordinate a refill when possible   Please contact if needed

## 2020-08-04 MED ORDER — TRAZODONE HCL 50 MG PO TABS: ORAL_TABLET | ORAL | 5 refills | Status: DC

## 2020-08-04 NOTE — Telephone Encounter (Signed)
Patient called in to request her next prescription be sent to the [facility].  [facility] - [place], [place] - [address] Phone:  [phone]  Fax:  [phone]

## 2020-08-04 NOTE — Telephone Encounter (Signed)
[facility] faxed refill request for the following medications:  traZODone (DESYREL) 50 MG tablet   Please advise.

## 2020-09-22 ENCOUNTER — Other Ambulatory Visit: Payer: Self-pay | Admitting: Physician Assistant

## 2020-09-22 DIAGNOSIS — F419 Anxiety disorder, unspecified: Secondary | ICD-10-CM

## 2020-09-22 DIAGNOSIS — E7800 Pure hypercholesterolemia, unspecified: Secondary | ICD-10-CM

## 2020-09-22 MED ORDER — ESCITALOPRAM OXALATE 20 MG PO TABS: 20.0000 mg | ORAL_TABLET | Freq: Every day | ORAL | 0 refills | Status: DC

## 2020-09-22 MED ORDER — SIMVASTATIN 20 MG PO TABS: 20.0000 mg | ORAL_TABLET | Freq: Every day | ORAL | 0 refills | Status: DC

## 2020-09-22 NOTE — Telephone Encounter (Signed)
Medication Refill - Medication: simvastatin (ZOCOR) 20 MG tablet    escitalopram (LEXAPRO) 20 MG tablet  Has the patient contacted their pharmacy? Yes.   (Agent: If no, request that the patient contact the pharmacy for the refill.) (Agent: If yes, when and what did the pharmacy advise?)  Preferred Pharmacy (with phone number or street name):  [facility] (Now [facility]) - [place], [place] - [address] Phone:  [phone]  Fax:  [phone]      Agent: Please be advised that RX refills may take up to 3 business days. We ask that you follow-up with your pharmacy.

## 2020-09-22 NOTE — Telephone Encounter (Signed)
Future visit in 1 week  

## 2020-10-05 ENCOUNTER — Other Ambulatory Visit: Payer: Self-pay

## 2020-10-05 ENCOUNTER — Encounter: Payer: Self-pay | Admitting: Family Medicine

## 2020-10-05 ENCOUNTER — Ambulatory Visit (INDEPENDENT_AMBULATORY_CARE_PROVIDER_SITE_OTHER): Payer: Medicare HMO | Admitting: Family Medicine

## 2020-10-05 VITALS — BP 111/73 | HR 80 | Temp 97.9°F | Resp 16 | Wt 146.0 lb

## 2020-10-05 DIAGNOSIS — M5387 Other specified dorsopathies, lumbosacral region: Secondary | ICD-10-CM | POA: Diagnosis not present

## 2020-10-05 DIAGNOSIS — R519 Headache, unspecified: Secondary | ICD-10-CM | POA: Diagnosis not present

## 2020-10-05 MED ORDER — TIZANIDINE HCL 4 MG PO TABS: 4.0000 mg | ORAL_TABLET | ORAL | 0 refills | Status: DC | PRN

## 2020-10-05 MED ORDER — METOCLOPRAMIDE HCL 10 MG PO TABS: 10.0000 mg | ORAL_TABLET | Freq: Three times a day (TID) | ORAL | 0 refills | Status: DC | PRN

## 2020-10-05 NOTE — Assessment & Plan Note (Signed)
Pain and description consistent with sciatica pain for DJD  MRI held off at this time  Suggestions of medication like TCAs and gabapentin declined  Mutual decision made to attend PT to learn strategies to cope with pain and prevent further injury

## 2020-10-05 NOTE — Progress Notes (Signed)
I,[name],acting as a scribe for [name], FNP.,have documented all relevant documentation on the behalf of [name], FNP,as directed by  [name], FNP while in the presence of [name], FNP.   Established patient visit   Patient: Melissa Buck   DOB: October 04, 1950   70 y.o. Female  MRN: [id number] Visit Date: 10/05/2020  Today's healthcare provider: [name], FNP   Chief Complaint  Patient presents with   Follow-up   Depression   Anxiety   Insomnia   Leg Pain   Subjective    Leg Pain  The incident occurred more than 1 week ago (6 months). There was no injury mechanism. The pain is present in the right thigh and right knee. The quality of the pain is described as aching. The pain is at a severity of 5/10. The pain is moderate. The pain has been Intermittent since onset. Associated symptoms include a loss of motion, a loss of sensation, muscle weakness, numbness and tingling. Pertinent negatives include no inability to bear weight. She reports no foreign bodies present. The symptoms are aggravated by movement and weight bearing. She has tried NSAIDs (meloxicam) for the symptoms. The treatment provided mild relief.    Patient states for the past 6 months she intermittent right knee pain. Patient states her knee begins hurting with a lot of movement and than her upper right thigh will become numb. Numbness will last for hours. Patient has meloxicam that she takes for her shoulder pain, she states she just uses that for right knee pain.   Follow up for Anxiety and depression  The patient was last seen for this 3 months ago. Changes made at last visit includes; restarted - escitalopram (Lexapro)20 MG tablet.  She reports good compliance with treatment. She feels that condition is Improved. She is not having side effects. none  -----------------------------------------------------------------------  Follow up for Primary insomnia  The patient was  last seen for this 3 months ago. Changes made at last visit include; Not sleeping well. Using Trazodone 50 mg 1-2 tablets hs.   She reports good compliance with treatment. She feels that condition is Unchanged. She is not having side effects. none  -----------------------------------------------------------------------      Medications: Outpatient Medications Prior to Visit  Medication Sig   albuterol (PROVENTIL HFA;VENTOLIN HFA) 108 (90 Base) MCG/ACT inhaler Inhale 2 puffs into the lungs every 6 (six) hours as needed for wheezing or shortness of breath.   cetirizine (ZYRTEC) 10 MG chewable tablet Chew 10 mg by mouth daily.   escitalopram (LEXAPRO) 20 MG tablet Take 1 tablet (20 mg total) by mouth daily.   Multiple Vitamin (MULTIVITAMIN) tablet Take 1 tablet by mouth daily.   simvastatin (ZOCOR) 20 MG tablet Take 1 tablet (20 mg total) by mouth at bedtime.   traZODone (DESYREL) 50 MG tablet TAKE 1/2 TO 1 TABLET (25-50 MG TOTAL) BY MOUTH AT BEDTIME AS NEEDED FOR SLEEP.   vitamin B-12 (CYANOCOBALAMIN) 1000 MCG tablet Take 1,000 mcg by mouth daily.   [DISCONTINUED] Probiotic Product (PROBIOTIC DAILY PO) Take by mouth.   [DISCONTINUED] tiZANidine (ZANAFLEX) 4 MG tablet TAKE 1 TO 3 TABLETS BY MOUTH EVERY 8 HOURS AS NEEDED FOR HEADACHE. MAX OF 8 TABLETS PER DAY   Calcium Carbonate-Vit D-Min (CALCIUM 1200 PO) Take by mouth daily.  (Patient not taking: No sig reported)   [DISCONTINUED] gentamicin cream (GARAMYCIN) 0.1 % Apply 1 application topically 2 (two) times daily. (Patient not taking:  No sig reported)   [DISCONTINUED] hydrOXYzine (ATARAX/VISTARIL) 10 MG tablet TAKE 1 TABLET TWICE DAILY AS NEEDED (Patient not taking: No sig reported)   No facility-administered medications prior to visit.    Review of Systems  Constitutional:  Negative for appetite change, chills, fatigue and fever.  Respiratory:  Negative for chest tightness and shortness of breath.   Cardiovascular:  Negative for  chest pain and palpitations.  Gastrointestinal:  Negative for abdominal pain, nausea and vomiting.  Neurological:  Positive for tingling and numbness. Negative for dizziness and weakness.      Objective    BP 111/73 (BP Location: Left Arm, Patient Position: Sitting, Cuff Size: Normal)   Pulse 80   Temp 97.9 F (36.6 C) (Temporal)   Resp 16   Wt 146 lb (66.2 kg)   SpO2 97%   BMI 27.59 kg/m     Physical Exam Vitals and nursing note reviewed.  Constitutional:      General: She is awake.     Appearance: Normal appearance. She is well-developed and overweight. She is not ill-appearing, toxic-appearing or diaphoretic.  Cardiovascular:     Rate and Rhythm: Normal rate and regular rhythm.     Pulses: Normal pulses.          Carotid pulses are 2+ on the right side and 2+ on the left side.      Radial pulses are 2+ on the right side and 2+ on the left side.       Femoral pulses are 2+ on the right side and 2+ on the left side.      Popliteal pulses are 2+ on the right side and 2+ on the left side.       Dorsalis pedis pulses are 2+ on the right side and 2+ on the left side.       Posterior tibial pulses are 2+ on the right side and 2+ on the left side.     Heart sounds: Normal heart sounds, S1 normal and S2 normal. No murmur heard.   No friction rub. No gallop.  Pulmonary:     Effort: Pulmonary effort is normal.     Breath sounds: Normal breath sounds and air entry.  Abdominal:     General: Bowel sounds are normal.     Palpations: Abdomen is soft.  Musculoskeletal:     Right knee: Normal.     Left knee: Normal.     Right lower leg: Normal. No swelling. No edema.     Left lower leg: Normal. No swelling. No edema.  Skin:    General: Skin is warm and dry.  Neurological:     General: No focal deficit present.     Mental Status: She is alert and oriented to person, place, and time. Mental status is at baseline.     Cranial Nerves: Cranial nerves are intact.     Sensory: Sensation  is intact.     Motor: Motor function is intact. No weakness or atrophy.     Gait: Gait is intact.  Psychiatric:        Attention and Perception: Attention normal.        Mood and Affect: Mood and affect normal.        Speech: Speech normal.        Behavior: Behavior normal. Behavior is cooperative.        Thought Content: Thought content normal.        Cognition and Memory: Cognition normal.  Judgment: Judgment normal.     No results found for any visits on 10/05/20.  Assessment & Plan     Problem List Items Addressed This Visit       Nervous and Auditory   Sciatica associated with disorder of lumbosacral spine - Primary    Pain and description consistent with sciatica pain for DJD  MRI held off at this time  Suggestions of medication like TCAs and gabapentin declined  Mutual decision made to attend PT to learn strategies to cope with pain and prevent further injury       Relevant Medications   tiZANidine (ZANAFLEX) 4 MG tablet   Other Relevant Orders   Ambulatory referral to Physical Therapy     Other   Nonintractable headache    Chronic  Up to 1/week  Medications refilled  Overall symptoms have improved       Relevant Medications   metoCLOPramide (REGLAN) 10 MG tablet   tiZANidine (ZANAFLEX) 4 MG tablet     Return in about 6 weeks (around 11/16/2020) for annual examination.      Leilani Merl, FNP, have reviewed all documentation for this visit. The documentation on 10/05/20 for the exam, diagnosis, procedures, and orders are all accurate and complete.    [name], FNP  [facility] [phone] (phone) [phone] (fax)  [facility]

## 2020-10-05 NOTE — Assessment & Plan Note (Signed)
Chronic  Up to 1/week  Medications refilled  Overall symptoms have improved

## 2020-10-13 ENCOUNTER — Other Ambulatory Visit: Payer: Self-pay | Admitting: Family Medicine

## 2020-10-13 DIAGNOSIS — R519 Headache, unspecified: Secondary | ICD-10-CM

## 2020-10-16 ENCOUNTER — Other Ambulatory Visit: Payer: Self-pay | Admitting: Family Medicine

## 2020-10-16 DIAGNOSIS — F419 Anxiety disorder, unspecified: Secondary | ICD-10-CM

## 2020-10-16 DIAGNOSIS — F32A Depression, unspecified: Secondary | ICD-10-CM

## 2020-10-18 ENCOUNTER — Other Ambulatory Visit: Payer: Self-pay | Admitting: Family Medicine

## 2020-10-18 DIAGNOSIS — R519 Headache, unspecified: Secondary | ICD-10-CM

## 2020-10-26 ENCOUNTER — Other Ambulatory Visit: Payer: Self-pay

## 2020-10-26 ENCOUNTER — Ambulatory Visit: Payer: Medicare HMO | Attending: Family Medicine | Admitting: Physical Therapy

## 2020-10-26 VITALS — BP 124/71 | HR 67

## 2020-10-26 DIAGNOSIS — M5431 Sciatica, right side: Secondary | ICD-10-CM | POA: Insufficient documentation

## 2020-10-26 NOTE — Therapy (Signed)
[place] [facility] [address], [place], [address] Phone: [phone]   Fax:  [phone]  Physical Therapy Treatment  Patient Details  Name: Melissa Buck MRN: [id number] Date of Birth: 1950-05-22 No data recorded  Encounter Date: 10/26/2020   PT End of Session - 10/26/20 1206     Visit Number 1    Number of Visits 11    Date for PT Re-Evaluation 11/30/20             Past Medical History:  Diagnosis Date   Arthritis    knees, ankles,    Asthma    Colitis    COPD (chronic obstructive pulmonary disease) (HCC)    GERD (gastroesophageal reflux disease)    Headache    history of migraines   Hyperlipidemia    Wears contact lenses    Wears dentures    upper full plate    Past Surgical History:  Procedure Laterality Date   ABDOMINAL HYSTERECTOMY     CERVICAL FUSION     C5 - C6 fusion   COLONOSCOPY     COLONOSCOPY WITH PROPOFOL N/A 12/11/2018   Procedure: COLONOSCOPY WITH PROPOFOL;  Surgeon: Midge Minium, MD;  Location: [facility];  Service: Endoscopy;  Laterality: N/A;   TOENAIL EXCISION      Vitals:   10/26/20 1156  BP: 124/71  Pulse: 67     Subjective Assessment - 10/26/20 1156     Subjective Pt reports that she will be going on cruise and that she would like to have less right hip pain. She is currently going to gym and doing exercise for her back and abdominals. Numbness has improved since starting exercises. Every so often she her right knee buckles and she loses her balance, but she has not fallen in a year. She experiences most of her pain when bending forward.    Pertinent History 10/05/20 per Robynn Pane Payne's note : The incident occurred more than 1 week ago (6 months). There was no injury mechanism. The pain is present in the right thigh and right knee. The quality of the pain is described as aching. The pain is at a severity of 5/10. The pain is moderate. The pain has been  Intermittent since onset. Associated symptoms include a loss of motion, a loss of sensation, muscle weakness, numbness and tingling. Pertinent negatives include no inability to bear weight. She reports no foreign bodies present. The symptoms are aggravated by movement and weight bearing. She has tried NSAIDs (meloxicam) for the symptoms. The treatment provided mild relief.      Patient states for the past 6 months she intermittent right knee pain. Patient states her knee begins hurting with a lot of movement and than her upper right thigh will become numb. Numbness will last for hours. Patient has meloxicam that she takes for her shoulder pain, she states she just uses that for right knee pain.    How long can you sit comfortably? N/a    How long can you stand comfortably? Able to stand but not for a long time    How long can you walk comfortably? R knee will give way once in a while    Patient Stated Goals Wants to feel better for cruise.    Currently in Pain? Yes    Pain Score 4     Pain Location Back   Bilateral paraspinals   Pain Orientation Right;Left;Lower    Pain Descriptors / Indicators  Numbness;Throbbing    Pain Type Chronic pain    Pain Radiating Towards Down lateral side of RLE to ankle    Pain Onset More than a month ago    Pain Frequency Intermittent                                       PT Education - 10/26/20 1206     Education Details form/technique with exercise. Explanation about condition    Person(s) Educated Patient    Methods Explanation;Demonstration    Comprehension Verbalized understanding;Returned demonstration;Verbal cues required                         Patient will benefit from skilled therapeutic intervention in order to improve the following deficits and impairments:     Visit Diagnosis: No diagnosis found.     Problem List Patient Active Problem List   Diagnosis Date Noted   Sciatica associated with  disorder of lumbosacral spine 10/05/2020   Nonintractable headache 10/05/2020   Personal history of colonic polyps    Intractable migraine with aura without status migrainosus 09/22/2016   Bronchitis 05/10/2015   Dermatitis, eczematoid 03/10/2015   D (diarrhea) 03/10/2015   Difficulty hearing 03/10/2015   Hypercholesteremia 03/10/2015   Headache, migraine 03/10/2015   Anxiety 11/14/2014   DD (diverticular disease) 09/11/2006   CAFL (chronic airflow limitation) (HCC) 07/01/2006   Adaptation reaction 03/19/2002   Allergic rhinitis 03/19/2002   Cannot sleep 03/19/2002   Current tobacco use 03/19/2002    Johnn Hai 10/26/2020, 12:09 PM  [facility] [address], [place], [address] Phone: [phone]   Fax:  [phone]  Name: Melissa Buck MRN: [id number] Date of Birth: 1950/12/04

## 2020-10-26 NOTE — Therapy (Addendum)
[place] [facility] [address] [place], [place], [address] Phone: [phone]   Fax:  [phone]  Physical Therapy Evaluation  Patient Details  Name: Melissa Buck MRN: [id number] Date of Birth: 09/18/1950 No data recorded  Encounter Date: 10/26/2020   PT End of Session - 10/26/20 1206     Visit Number 1    Number of Visits 1    Date for PT Re-Evaluation 11/30/20    PT Start Time 1145    PT Stop Time 1230    PT Time Calculation (min) 45 min    Activity Tolerance Patient tolerated treatment well    Behavior During Therapy WFL for tasks assessed/performed             Past Medical History:  Diagnosis Date   Arthritis    knees, ankles,    Asthma    Colitis    COPD (chronic obstructive pulmonary disease) (HCC)    GERD (gastroesophageal reflux disease)    Headache    history of migraines   Hyperlipidemia    Wears contact lenses    Wears dentures    upper full plate    Past Surgical History:  Procedure Laterality Date   ABDOMINAL HYSTERECTOMY     CERVICAL FUSION     C5 - C6 fusion   COLONOSCOPY     COLONOSCOPY WITH PROPOFOL N/A 12/11/2018   Procedure: COLONOSCOPY WITH PROPOFOL;  Surgeon: Midge MiniumWohl, Darren, MD;  [facility];  Service: Endoscopy;  Laterality: N/A;   TOENAIL EXCISION      Vitals:   10/26/20 1156  BP: 124/71  Pulse: 67      Subjective Assessment - 10/26/20 1156     Subjective Pt reports that she will be going on cruise and that she would like to have less right hip pain. She is currently going to gym and doing exercise for her back and abdominals. Numbness has improved since starting exercises. Every so often she her right knee buckles and she loses her balance, but she has not fallen in a year. She experiences most of her pain when bending forward.    Pertinent History 10/05/20 per Robynn PaneElise Payne's note : The incident occurred more than 1 week ago (6 months). There was no  injury mechanism. The pain is present in the right thigh and right knee. The quality of the pain is described as aching. The pain is at a severity of 5/10. The pain is moderate. The pain has been Intermittent since onset. Associated symptoms include a loss of motion, a loss of sensation, muscle weakness, numbness and tingling. Pertinent negatives include no inability to bear weight. She reports no foreign bodies present. The symptoms are aggravated by movement and weight bearing. She has tried NSAIDs (meloxicam) for the symptoms. The treatment provided mild relief.      Patient states for the past 6 months she intermittent right knee pain. Patient states her knee begins hurting with a lot of movement and than her upper right thigh will become numb. Numbness will last for hours. Patient has meloxicam that she takes for her shoulder pain, she states she just uses that for right knee pain.    How long can you sit comfortably? N/a    How long can you stand comfortably? Able to stand but not for a long time    How long can you walk comfortably? R knee will give way once in a while    Patient Stated  Goals Wants to feel better for cruise.    Currently in Pain? Yes    Pain Score 4     Pain Location Back   Bilateral paraspinals   Pain Orientation Right;Left;Lower    Pain Descriptors / Indicators Numbness;Throbbing    Pain Type Chronic pain    Pain Radiating Towards Down lateral side of RLE to ankle    Pain Onset More than a month ago    Pain Frequency Intermittent             LOW BACK EVALUATION   - Cauda Equina Syndrome: Negative to all symptoms  Bladder/bowel dysfunction Saddle anesthesia  Sexual dysfunction Possible neurological deficits in the lower limb (motor or sensory loss, reflex change) NEURO:   Sensation intact for all LE dermatomes   AROM (in standing):         Lumbar flexion: 100%                ext: 100%                SB R/L: 100%                Rot R/L: 100%   AROM:                    R      L          Norms           Hip flexion:  120    120         120             ER:             45    45              45              IR:              20     20             45   AROM:                   R      L          Norms           Hip flexion:  120    120         120             ER:             45    45              45              IR:              20     20             45   Strength: R/ L         Hip flex: NT               abd: 5/5  5/5         Knee flex: 5/5 5/5               ext: NT          Ankle DF: NT     Great toe ext: NT   Special  Tests:          DTR-Patella: NT          Slump test: -          SLR: - B         FABERS: -B          FADDIR: - B          Leg length symmetry: NT          Thigh thrust: - B          SI joint cluster: NT          Thomas Test: + B   Functional Tests & Measures   5 Times Sit to Stand: 11.58 sec  (Individuals with times that exceed the listed time have worse than average performance - predictive of recurrent falls in healthy community-living subjects)         - 60-69 y.o. 11.4 sec   30 sec chair stands: 12  <11 Women 65-69   *A below average score indicates a risk for falls        PT Education - 10/26/20 1206     Education Details form/technique with exercise. Explanation about condition    Person(s) Educated Patient    Methods Explanation;Demonstration    Comprehension Verbalized understanding;Returned demonstration;Verbal cues required              PT Short Term Goals - 10/26/20 2035       PT SHORT TERM GOAL #1   Title Patient will demonstrate understanding of home exercise plan for self-treatment of condition.    Baseline Demonstrates understanding    Status Achieved               PT Long Term Goals - 10/26/20 2036       PT LONG TERM GOAL #1   Title Pt will meet predicted FOTO score.    Baseline 10/26/20: 94 Actual/89 Predicted    Time 5    Period Weeks    Status Achieved    Target  Date 11/30/20                    Plan - 10/26/20 2041     Clinical Impression Statement Pt is a 70 yo white woman that presents with low back pain with radicular symptoms down right leg. Pt demonstrates minor deficits with hip flexibility and strength. However, pt exhibits ability to self-manage her lumbar radiculopathy having already engaged with exercise and witnessing a reducation in her symptoms. She was given additional exercises to improve hip flexibility, and she was instructed that she could self-manage condition and she did not need further PT.    Personal Factors and Comorbidities Comorbidity 2    Comorbidities Anxiety, Depression    Examination-Activity Limitations Other   Cleaning home and prolonged activity   Examination-Participation Restrictions Other   cleaning house   Stability/Clinical Decision Making Stable/Uncomplicated    Clinical Decision Making Low    PT Next Visit Plan N/a    Consulted and Agree with Plan of Care Patient            HEP includes following exercise:  Lower trunk rotation 3 x10 x 7 days per week  Single Knee to Chest 3  x 10  x 7 days per week  Modified Thomas Stretch 5 x 60 sec with use of strap x 7 days per week   Seated Hip ER 5 x 60 sec x 7 days per week  Seated butterfly stretch with forward flexion 5 x 60 sec x 7 days per week   Patient will benefit from skilled therapeutic intervention in order to improve the following deficits and impairments:  Impaired flexibility, Pain  Visit Diagnosis: Sciatica, right side     Problem List Patient Active Problem List   Diagnosis Date Noted   Sciatica associated with disorder of lumbosacral spine 10/05/2020   Nonintractable headache 10/05/2020   Personal history of colonic polyps    Intractable migraine with aura without status migrainosus 09/22/2016   Bronchitis 05/10/2015   Dermatitis, eczematoid 03/10/2015   D (diarrhea) 03/10/2015   Difficulty hearing 03/10/2015    Hypercholesteremia 03/10/2015   Headache, migraine 03/10/2015   Anxiety 11/14/2014   DD (diverticular disease) 09/11/2006   CAFL (chronic airflow limitation) (HCC) 07/01/2006   Adaptation reaction 03/19/2002   Allergic rhinitis 03/19/2002   Cannot sleep 03/19/2002   Current tobacco use 03/19/2002   Ellin Goodie PT, DPT  10/26/2020, 8:55 PM  [facility] [address], [place], [address] Phone: [phone]   Fax:  [phone]  Name: Melissa Buck MRN: [id number] Date of Birth: 1950/04/01

## 2020-10-30 ENCOUNTER — Ambulatory Visit: Payer: Medicare HMO | Admitting: Physical Therapy

## 2020-11-02 ENCOUNTER — Encounter: Payer: Medicare HMO | Admitting: Physical Therapy

## 2020-11-07 ENCOUNTER — Encounter: Payer: Medicare HMO | Admitting: Physical Therapy

## 2020-11-13 ENCOUNTER — Encounter: Payer: Medicare HMO | Admitting: Physical Therapy

## 2020-11-16 ENCOUNTER — Encounter: Payer: Medicare HMO | Admitting: Physical Therapy

## 2020-11-20 ENCOUNTER — Encounter: Payer: Medicare HMO | Admitting: Physical Therapy

## 2020-11-21 ENCOUNTER — Ambulatory Visit (INDEPENDENT_AMBULATORY_CARE_PROVIDER_SITE_OTHER): Payer: Medicare HMO | Admitting: Family Medicine

## 2020-11-21 ENCOUNTER — Other Ambulatory Visit: Payer: Self-pay

## 2020-11-21 ENCOUNTER — Encounter: Payer: Self-pay | Admitting: Family Medicine

## 2020-11-21 VITALS — BP 119/82 | HR 70 | Temp 98.2°F | Ht 61.0 in | Wt 145.6 lb

## 2020-11-21 DIAGNOSIS — R519 Headache, unspecified: Secondary | ICD-10-CM

## 2020-11-21 DIAGNOSIS — M25512 Pain in left shoulder: Secondary | ICD-10-CM

## 2020-11-21 DIAGNOSIS — Z720 Tobacco use: Secondary | ICD-10-CM

## 2020-11-21 DIAGNOSIS — Z23 Encounter for immunization: Secondary | ICD-10-CM | POA: Insufficient documentation

## 2020-11-21 DIAGNOSIS — Z716 Tobacco abuse counseling: Secondary | ICD-10-CM | POA: Insufficient documentation

## 2020-11-21 DIAGNOSIS — E7800 Pure hypercholesterolemia, unspecified: Secondary | ICD-10-CM | POA: Diagnosis not present

## 2020-11-21 DIAGNOSIS — F5101 Primary insomnia: Secondary | ICD-10-CM

## 2020-11-21 DIAGNOSIS — Z0000 Encounter for general adult medical examination without abnormal findings: Secondary | ICD-10-CM | POA: Insufficient documentation

## 2020-11-21 DIAGNOSIS — F419 Anxiety disorder, unspecified: Secondary | ICD-10-CM | POA: Diagnosis not present

## 2020-11-21 DIAGNOSIS — G8929 Other chronic pain: Secondary | ICD-10-CM | POA: Insufficient documentation

## 2020-11-21 DIAGNOSIS — T753XXA Motion sickness, initial encounter: Secondary | ICD-10-CM | POA: Insufficient documentation

## 2020-11-21 DIAGNOSIS — F32A Depression, unspecified: Secondary | ICD-10-CM | POA: Insufficient documentation

## 2020-11-21 MED ORDER — MELOXICAM 15 MG PO TABS
15.0000 mg | ORAL_TABLET | Freq: Every day | ORAL | 11 refills | Status: DC | PRN
Start: 2020-11-21 — End: 2022-05-07

## 2020-11-21 MED ORDER — ESCITALOPRAM OXALATE 20 MG PO TABS: 20.0000 mg | ORAL_TABLET | Freq: Every day | ORAL | 3 refills | Status: DC

## 2020-11-21 MED ORDER — SIMVASTATIN 20 MG PO TABS: 20.0000 mg | ORAL_TABLET | Freq: Every day | ORAL | 3 refills | Status: DC

## 2020-11-21 MED ORDER — TIZANIDINE HCL 4 MG PO TABS: 4.0000 mg | ORAL_TABLET | ORAL | 3 refills | Status: DC | PRN

## 2020-11-21 MED ORDER — TRAZODONE HCL 50 MG PO TABS: 50.0000 mg | ORAL_TABLET | Freq: Every evening | ORAL | 3 refills | Status: DC | PRN

## 2020-11-21 MED ORDER — SCOPOLAMINE 1 MG/3DAYS TD PT72: 1.0000 | MEDICATED_PATCH | TRANSDERMAL | 1 refills | Status: DC

## 2020-11-21 MED ORDER — METOCLOPRAMIDE HCL 10 MG PO TABS: 10.0000 mg | ORAL_TABLET | Freq: Three times a day (TID) | ORAL | 3 refills | Status: AC | PRN

## 2020-11-21 NOTE — Assessment & Plan Note (Signed)
Plan for cruise in October

## 2020-11-21 NOTE — Assessment & Plan Note (Signed)
Much improved; continue regimen prn

## 2020-11-21 NOTE — Patient Instructions (Signed)
Preventive Care 69 Years and Older, Female Preventive care refers to lifestyle choices and visits with your health care provider that can promote health and wellness. This includes: A yearly physical exam. This is also called an annual wellness visit. Regular dental and eye exams. Immunizations. Screening for certain conditions. Healthy lifestyle choices, such as: Eating a healthy diet. Getting regular exercise. Not using drugs or products that contain nicotine and tobacco. Limiting alcohol use. What can I expect for my preventive care visit? Physical exam Your health care provider will check your: Height and weight. These may be used to calculate your BMI (body mass index). BMI is a measurement that tells if you are at a healthy weight. Heart rate and blood pressure. Body temperature. Skin for abnormal spots. Counseling Your health care provider may ask you questions about your: Past medical problems. Family's medical history. Alcohol, tobacco, and drug use. Emotional well-being. Home life and relationship well-being. Sexual activity. Diet, exercise, and sleep habits. History of falls. Memory and ability to understand (cognition). Work and work Statistician. Pregnancy and menstrual history. Access to firearms. What immunizations do I need? Vaccines are usually given at various ages, according to a schedule. Your health care provider will recommend vaccines for you based on your age, medical history, and lifestyle or other factors, such as travel or where you work. What tests do I need? Blood tests Lipid and cholesterol levels. These may be checked every 5 years, or more often depending on your overall health. Hepatitis C test. Hepatitis B test. Screening Lung cancer screening. You may have this screening every year starting at age 69 if you have a 30-pack-year history of smoking and currently smoke or have quit within the past 15 years. Colorectal cancer screening. All  adults should have this screening starting at age 69 and continuing until age 9. Your health care provider may recommend screening at age 69 if you are at increased risk. You will have tests every 1-10 years, depending on your results and the type of screening test. Diabetes screening. This is done by checking your blood sugar (glucose) after you have not eaten for a while (fasting). You may have this done every 1-3 years. Mammogram. This may be done every 1-2 years. Talk with your health care provider about how often you should have regular mammograms. Abdominal aortic aneurysm (AAA) screening. You may need this if you are a current or former smoker. BRCA-related cancer screening. This may be done if you have a family history of breast, ovarian, tubal, or peritoneal cancers. Other tests STD (sexually transmitted disease) testing, if you are at risk. Bone density scan. This is done to screen for osteoporosis. You may have this done starting at age 69. Talk with your health care provider about your test results, treatment options, and if necessary, the need for more tests. Follow these instructions at home: Eating and drinking  Eat a diet that includes fresh fruits and vegetables, whole grains, lean protein, and low-fat dairy products. Limit your intake of foods with high amounts of sugar, saturated fats, and salt. Take vitamin and mineral supplements as recommended by your health care provider. Do not drink alcohol if your health care provider tells you not to drink. If you drink alcohol: Limit how much you have to 0-1 drink a day. Be aware of how much alcohol is in your drink. In the [place] one drink equals one 12 oz bottle of beer (355 mL), one 5 oz glass of wine (148 mL), or one  1 oz glass of hard liquor (44 mL). Lifestyle Take daily care of your teeth and gums. Brush your teeth every morning and night with fluoride toothpaste. Floss one time each day. Stay active. Exercise for at least  30 minutes 5 or more days each week. Do not use any products that contain nicotine or tobacco, such as cigarettes, e-cigarettes, and chewing tobacco. If you need help quitting, ask your health care provider. Do not use drugs. If you are sexually active, practice safe sex. Use a condom or other form of protection in order to prevent STIs (sexually transmitted infections). Talk with your health care provider about taking a low-dose aspirin or statin. Find healthy ways to cope with stress, such as: Meditation, yoga, or listening to music. Journaling. Talking to a trusted person. Spending time with friends and family. Safety Always wear your seat belt while driving or riding in a vehicle. Do not drive: If you have been drinking alcohol. Do not ride with someone who has been drinking. When you are tired or distracted. While texting. Wear a helmet and other protective equipment during sports activities. If you have firearms in your house, make sure you follow all gun safety procedures. What's next? Visit your health care provider once a year for an annual wellness visit. Ask your health care provider how often you should have your eyes and teeth checked. Stay up to date on all vaccines. This information is not intended to replace advice given to you by your health care provider. Make sure you discuss any questions you have with your health care provider. Document Revised: 04/28/2020 Document Reviewed: 02/12/2018 Elsevier Patient Education  2022 [employer].

## 2020-11-21 NOTE — Assessment & Plan Note (Signed)
Repeat lab work today Continue heart health diet and exercise

## 2020-11-21 NOTE — Assessment & Plan Note (Signed)
Smoking 3 cigs a day; hoping to cut back and quit by the time the cruise is over given lack of access

## 2020-11-21 NOTE — Assessment & Plan Note (Signed)
Given today.

## 2020-11-21 NOTE — Assessment & Plan Note (Signed)

## 2020-11-21 NOTE — Progress Notes (Signed)
Complete physical exam   Patient: Melissa Buck   DOB: October 08, 1950   70 y.o. Female  MRN: [id number] Visit Date: 11/21/2020  Today's healthcare provider: [name], FNP   Chief Complaint  Patient presents with   Annual Exam   Subjective    [name] is a 70 y.o. female who presents today for a complete physical exam.  She reports consuming a general diet. Gym/ health club routine includes cardio and STRETCHING TO WORK ON CORE AND BACK. She generally feels well. She reports sleeping well. She does not have additional problems to discuss today.  HPI  Back has been much better has not taking any of her meloxicam. Patient reports getting sore at times but will take hot shower and soreness is relived. No concerns with her knee as well.  Gym routine consists of 2.5-3 hours, 3-5 days a week.  Concerned for motion sickness for upcoming cruise in last October 2022 to [place].   Past Medical History:  Diagnosis Date   Arthritis    knees, ankles,    Asthma    Colitis    COPD (chronic obstructive pulmonary disease) (HCC)    GERD (gastroesophageal reflux disease)    Headache    history of migraines   Hyperlipidemia    Wears contact lenses    Wears dentures    upper full plate   Past Surgical History:  Procedure Laterality Date   ABDOMINAL HYSTERECTOMY     CERVICAL FUSION     C5 - C6 fusion   COLONOSCOPY     COLONOSCOPY WITH PROPOFOL N/A 12/11/2018   Procedure: COLONOSCOPY WITH PROPOFOL;  Surgeon: Midge Minium, MD;  Location: [facility];  Service: Endoscopy;  Laterality: N/A;   TOENAIL EXCISION     Social History   Socioeconomic History   Marital status: Divorced    Spouse name: Not on file   Number of children: 3   Years of education: Not on file   Highest education level: Some college, no degree  Occupational History   Occupation: retired  Tobacco Use   Smoking status: Some Days    Packs/day: 0.25    Years: 50.00    Pack years: 12.50     Types: Cigarettes   Smokeless tobacco: Never   Tobacco comments:    3 a day  Vaping Use   Vaping Use: Never used  Substance and Sexual Activity   Alcohol use: Yes    Alcohol/week: 7.0 - 9.0 standard drinks    Types: 7 - 9 Glasses of wine per week   Drug use: No   Sexual activity: Not on file  Other Topics Concern   Not on file  Social History Narrative   Not on file   Social Determinants of Health   Financial Resource Strain: Low Risk    Difficulty of Paying Living Expenses: Not hard at all  Food Insecurity: No Food Insecurity   Worried About Programme researcher, broadcasting/film/video in the Last Year: Never true   Ran Out of Food in the Last Year: Never true  Transportation Needs: No Transportation Needs   Lack of Transportation (Medical): No   Lack of Transportation (Non-Medical): No  Physical Activity: Inactive   Days of Exercise per Week: 0 days   Minutes of Exercise per Session: 0 min  Stress: No Stress Concern Present   Feeling of Stress : Not at all  Social Connections: Moderately Isolated   Frequency of Communication with Friends and  Family: More than three times a week   Frequency of Social Gatherings with Friends and Family: More than three times a week   Attends Religious Services: Never   Database administrator or Organizations: No   Attends Engineer, structural: Never   Marital Status: Married  Catering manager Violence: Not At Risk   Fear of Current or Ex-Partner: No   Emotionally Abused: No   Physically Abused: No   Sexually Abused: No   Family Status  Relation Name Status   Sister  Alive   Brother  Deceased at age 30       heart attack   Mother  Deceased at age 50   Father  Other   Mat Aunt  (Not Specified)   Family History  Problem Relation Age of Onset   Healthy Sister    Heart disease Brother    Breast cancer Maternal Aunt    Allergies  Allergen Reactions   Aspirin    Erythromycin     Allergic to Mycins.   Hydrocodone-Acetaminophen Itching    GI  Upset   Sulfa Antibiotics     Throat closes up    Patient Care Team: [name], FNP as PCP - General (Family Medicine) Sherrie Mustache Demetrios Isaacs, MD as Referring Physician (Family Medicine) Verne Carrow, OD (Optometry) Felecia Shelling, DPM as Consulting Physician (Podiatry) Deeann Saint, MD (Orthopedic Surgery)   Medications: Outpatient Medications Prior to Visit  Medication Sig   albuterol (PROVENTIL HFA;VENTOLIN HFA) 108 (90 Base) MCG/ACT inhaler Inhale 2 puffs into the lungs every 6 (six) hours as needed for wheezing or shortness of breath.   Calcium Carbonate-Vit D-Min (CALCIUM 1200 PO) Take by mouth daily.   cetirizine (ZYRTEC) 10 MG chewable tablet Chew 10 mg by mouth daily.   Multiple Vitamin (MULTIVITAMIN) tablet Take 1 tablet by mouth daily.   vitamin B-12 (CYANOCOBALAMIN) 1000 MCG tablet Take 1,000 mcg by mouth daily.   [DISCONTINUED] escitalopram (LEXAPRO) 20 MG tablet Take 1 tablet (20 mg total) by mouth daily.   [DISCONTINUED] metoCLOPramide (REGLAN) 10 MG tablet Take 1 tablet (10 mg total) by mouth 3 (three) times daily as needed for nausea.   [DISCONTINUED] simvastatin (ZOCOR) 20 MG tablet Take 1 tablet (20 mg total) by mouth at bedtime.   [DISCONTINUED] tiZANidine (ZANAFLEX) 4 MG tablet Take 1 tablet (4 mg total) by mouth every 4 (four) hours as needed (headache).   [DISCONTINUED] traZODone (DESYREL) 50 MG tablet TAKE 1/2 TO 1 TABLET (25-50 MG TOTAL) BY MOUTH AT BEDTIME AS NEEDED FOR SLEEP.   No facility-administered medications prior to visit.    Review of Systems  Constitutional:  Positive for activity change.  Endocrine: Positive for polyphagia and polyuria.  All other systems reviewed and are negative.  Feels diet and appetite changes related to increased activity; weight is stable.  Urinary symptoms d/t weak pelvic floor; pt refused additional workup at this time.  Last CBC Lab Results  Component Value Date   WBC 4.2 11/30/2019   HGB 12.3 11/30/2019   HCT  36.1 11/30/2019   MCV 94.5 11/30/2019   MCH 32.2 11/30/2019   RDW 13.5 11/30/2019   PLT 230 11/30/2019   Last metabolic panel Lab Results  Component Value Date   GLUCOSE 91 11/30/2019   NA 140 11/30/2019   K 3.9 11/30/2019   CL 104 11/30/2019   CO2 27 11/30/2019   BUN 8 11/30/2019   CREATININE 0.72 11/30/2019   GFRNONAA >60 11/30/2019  GFRAA >60 11/30/2019   CALCIUM 8.5 (L) 11/30/2019   PROT 7.0 11/30/2019   ALBUMIN 4.0 11/30/2019   LABGLOB 2.5 07/08/2019   AGRATIO 1.9 07/08/2019   BILITOT 0.6 11/30/2019   ALKPHOS 82 11/30/2019   AST 28 11/30/2019   ALT 26 11/30/2019   ANIONGAP 9 11/30/2019   Last lipids Lab Results  Component Value Date   CHOL 184 07/08/2019   HDL 69 07/08/2019   LDLCALC 94 07/08/2019   TRIG 123 07/08/2019   CHOLHDL 2.7 07/08/2019   Last hemoglobin A1c No results found for: HGBA1C Last thyroid functions Lab Results  Component Value Date   TSH 1.970 07/08/2019   Last vitamin D No results found for: 25OHVITD2, 25OHVITD3, VD25OH Last vitamin B12 and Folate No results found for: VITAMINB12, FOLATE    Objective    BP 119/82 (BP Location: Left Arm, Patient Position: Sitting, Cuff Size: Large)   Pulse 70   Temp 98.2 F (36.8 C) (Oral)   Ht 5\' 1"  (1.549 m)   Wt 145 lb 9.6 oz (66 kg)   SpO2 100%   BMI 27.51 kg/m  BP Readings from Last 3 Encounters:  11/21/20 119/82  10/26/20 124/71  10/05/20 111/73   Wt Readings from Last 3 Encounters:  11/21/20 145 lb 9.6 oz (66 kg)  10/05/20 146 lb (66.2 kg)  11/30/19 140 lb (63.5 kg)      Physical Exam Vitals and nursing note reviewed.  Constitutional:      General: She is awake. She is not in acute distress.    Appearance: Normal appearance. She is well-developed, well-groomed and overweight. She is not ill-appearing, toxic-appearing or diaphoretic.  HENT:     Head: Normocephalic and atraumatic.     Jaw: There is normal jaw occlusion. No trismus, tenderness, swelling or pain on movement.      Right Ear: Hearing, tympanic membrane, ear canal and external ear normal. There is no impacted cerumen.     Left Ear: Hearing, tympanic membrane, ear canal and external ear normal. There is no impacted cerumen.     Nose: Nose normal. No congestion or rhinorrhea.     Right Turbinates: Not enlarged, swollen or pale.     Left Turbinates: Not enlarged, swollen or pale.     Right Sinus: No maxillary sinus tenderness or frontal sinus tenderness.     Left Sinus: No maxillary sinus tenderness or frontal sinus tenderness.     Mouth/Throat:     Lips: Pink.     Mouth: Mucous membranes are moist. No injury.     Tongue: No lesions.     Pharynx: Oropharynx is clear. Uvula midline. No pharyngeal swelling, oropharyngeal exudate, posterior oropharyngeal erythema or uvula swelling.     Tonsils: No tonsillar exudate or tonsillar abscesses.  Eyes:     General: Lids are normal. Lids are everted, no foreign bodies appreciated. Vision grossly intact. Gaze aligned appropriately. No allergic shiner or visual field deficit.       Right eye: No discharge.        Left eye: No discharge.     Extraocular Movements: Extraocular movements intact.     Conjunctiva/sclera: Conjunctivae normal.     Right eye: Right conjunctiva is not injected. No exudate.    Left eye: Left conjunctiva is not injected. No exudate.    Pupils: Pupils are equal, round, and reactive to light.  Neck:     Thyroid: No thyroid mass, thyromegaly or thyroid tenderness.     Vascular: No carotid  bruit.     Trachea: Trachea normal.  Cardiovascular:     Rate and Rhythm: Normal rate and regular rhythm.     Pulses: Normal pulses.          Carotid pulses are 2+ on the right side and 2+ on the left side.      Radial pulses are 2+ on the right side and 2+ on the left side.       Dorsalis pedis pulses are 2+ on the right side and 2+ on the left side.       Posterior tibial pulses are 2+ on the right side and 2+ on the left side.     Heart sounds: Normal  heart sounds, S1 normal and S2 normal. No murmur heard.   No friction rub. No gallop.  Pulmonary:     Effort: Pulmonary effort is normal. No respiratory distress.     Breath sounds: Normal breath sounds and air entry. No stridor. No wheezing, rhonchi or rales.  Chest:     Chest wall: No tenderness.     Comments: Breast exam deferred; discussed 'know your lemons' campaign and self exam Abdominal:     General: Abdomen is flat. Bowel sounds are normal. There is no distension.     Palpations: Abdomen is soft. There is no mass.     Tenderness: There is no abdominal tenderness. There is no right CVA tenderness, left CVA tenderness, guarding or rebound.     Hernia: No hernia is present.  Genitourinary:    Comments: Exam deferred; denies complaints Musculoskeletal:        General: No swelling, tenderness, deformity or signs of injury. Normal range of motion.     Cervical back: Full passive range of motion without pain, normal range of motion and neck supple. No edema, rigidity or tenderness. No muscular tenderness.     Right lower leg: No edema.     Left lower leg: No edema.  Lymphadenopathy:     Cervical: No cervical adenopathy.     Right cervical: No superficial, deep or posterior cervical adenopathy.    Left cervical: No superficial, deep or posterior cervical adenopathy.  Skin:    General: Skin is warm and dry.     Capillary Refill: Capillary refill takes less than 2 seconds.     Coloration: Skin is not jaundiced or pale.     Findings: No bruising, erythema, lesion or rash.  Neurological:     General: No focal deficit present.     Mental Status: She is alert and oriented to person, place, and time. Mental status is at baseline.     GCS: GCS eye subscore is 4. GCS verbal subscore is 5. GCS motor subscore is 6.     Sensory: Sensation is intact. No sensory deficit.     Motor: Motor function is intact. No weakness.     Coordination: Coordination is intact. Coordination normal.     Gait:  Gait is intact. Gait normal.  Psychiatric:        Attention and Perception: Attention and perception normal.        Mood and Affect: Mood and affect normal.        Speech: Speech normal.        Behavior: Behavior normal. Behavior is cooperative.        Thought Content: Thought content normal.        Cognition and Memory: Cognition and memory normal.        Judgment: Judgment normal.  Last depression screening scores PHQ 2/9 Scores 11/21/2020 07/11/2020 07/08/2019  PHQ - 2 Score 0 4 0  PHQ- 9 Score 0 17 2   Last fall risk screening Fall Risk  11/21/2020  Falls in the past year? 0  Number falls in past yr: 0  Injury with Fall? 0  Risk for fall due to : No Fall Risks  Risk for fall due to: Comment -  Follow up -   Last Audit-C alcohol use screening Alcohol Use Disorder Test (AUDIT) 11/21/2020  1. How often do you have a drink containing alcohol? 0  2. How many drinks containing alcohol do you have on a typical day when you are drinking? 0  3. How often do you have six or more drinks on one occasion? 0  AUDIT-C Score 0  4. How often during the last year have you found that you were not able to stop drinking once you had started? -  5. How often during the last year have you failed to do what was normally expected from you because of drinking? -  6. How often during the last year have you needed a first drink in the morning to get yourself going after a heavy drinking session? -  7. How often during the last year have you had a feeling of guilt of remorse after drinking? -  8. How often during the last year have you been unable to remember what happened the night before because you had been drinking? -  9. Have you or someone else been injured as a result of your drinking? -  10. Has a relative or friend or a doctor or another health worker been concerned about your drinking or suggested you cut down? -  Alcohol Use Disorder Identification Test Final Score (AUDIT) -  Alcohol Brief  Interventions/Follow-up -   A score of 3 or more in women, and 4 or more in men indicates increased risk for alcohol abuse, EXCEPT if all of the points are from question 1   No results found for any visits on 11/21/20.  Assessment & Plan    Routine Health Maintenance and Physical Exam  Exercise Activities and Dietary recommendations  Goals      Exercise 3x per week (30 min per time)     Recommend to exercise for 3 days a week for at least 30 minutes at a time.      Quit Smoking     Recommend to continue efforts to reduce smoking habits until no longer smoking.         Immunization History  Administered Date(s) Administered   Influenza, High Dose Seasonal PF 12/16/2017   PFIZER(Purple Top)SARS-COV-2 Vaccination 06/02/2019, 06/23/2019   Pneumococcal Conjugate-13 12/16/2017   Pneumococcal Polysaccharide-23 07/08/2019   Td 09/15/2003   Tdap 07/18/2016    Health Maintenance  Topic Date Due   Zoster Vaccines- Shingrix (1 of 2) Never done   COVID-19 Vaccine (3 - Booster for Pfizer series) 11/23/2019   INFLUENZA VACCINE  10/02/2020   MAMMOGRAM  03/28/2022   DEXA SCAN  01/27/2023   COLONOSCOPY (Pts 45-19yrs Insurance coverage will need to be confirmed)  12/11/2023   TETANUS/TDAP  07/19/2026   Hepatitis C Screening  Completed   HPV VACCINES  Aged Out    Discussed health benefits of physical activity, and encouraged her to engage in regular exercise appropriate for her age and condition.  Problem List Items Addressed This Visit       Other  Hypercholesteremia    Repeat lab work today Continue heart health diet and exercise      Relevant Medications   simvastatin (ZOCOR) 20 MG tablet   Other Relevant Orders   AMB Referral to [facility] Coordinaton   Current tobacco use    Smoking 3 cigs a day; hoping to cut back and quit by the time the cruise is over given lack of access       Nonintractable headache    Much improved; continue regimen prn      Relevant  Medications   escitalopram (LEXAPRO) 20 MG tablet   metoCLOPramide (REGLAN) 10 MG tablet   tiZANidine (ZANAFLEX) 4 MG tablet   traZODone (DESYREL) 50 MG tablet   meloxicam (MOBIC) 15 MG tablet   Other Relevant Orders   AMB Referral to [facility] Coordinaton   Annual physical exam - Primary    Things to do to keep yourself healthy  - Exercise at least 30-45 minutes a day, 3-4 days a week.  - Eat a low-fat diet with lots of fruits and vegetables, up to 7-9 servings per day.  - Seatbelts can save your life. Wear them always.  - Smoke detectors on every level of your home, check batteries every year.  - Eye Doctor - have an eye exam every 1-2 years  - Safe sex - if you may be exposed to STDs, use a condom.  - Alcohol -  If you drink, do it moderately, less than 2 drinks per day.  - Health Care Power of Attorney. Choose someone to speak for you if you are not able.  - Depression is common in our stressful world.If you're feeling down or losing interest in things you normally enjoy, please come in for a visit.  - Violence - If anyone is threatening or hurting you, please call immediately.        Relevant Orders   CBC with Differential/Platelet   Comprehensive metabolic panel   Lipid panel   TSH   AMB Referral to Community Care Coordinaton   Flu vaccine need    Given today      Relevant Orders   Flu vaccine HIGH DOSE PF (Fluzone High dose)   Anxiety and depression    Stable Continue lexapro daily Medication refill sent      Relevant Medications   escitalopram (LEXAPRO) 20 MG tablet   traZODone (DESYREL) 50 MG tablet   Other Relevant Orders   AMB Referral to Community [facility] Coordinaton   Chronic left shoulder pain    Much improved since gym routine started; going with her daughter      Relevant Medications   escitalopram (LEXAPRO) 20 MG tablet   tiZANidine (ZANAFLEX) 4 MG tablet   traZODone (DESYREL) 50 MG tablet   meloxicam (MOBIC) 15 MG tablet   Other Relevant  Orders   AMB Referral to Community Care Coordinaton   Motion sickness    Plan for cruise in October      Relevant Medications   scopolamine (TRANSDERM-SCOP, 1.5 MG,) 1 MG/3DAYS   Encounter for tobacco use cessation counseling    Has tried meds before and they made her ill; has cut back on quantity Thinks she can quit with cruise given lack of free space to smoke      RESOLVED: Cannot sleep   Relevant Medications   traZODone (DESYREL) 50 MG tablet   Other Relevant Orders   AMB Referral to [facility] Coordinaton     Return in about 1  year (around 11/21/2021) for annual examination.     Leilani Merl, FNP, have reviewed all documentation for this visit. The documentation on 11/21/20 for the exam, diagnosis, procedures, and orders are all accurate and complete.    [name], FNP  [facility] [phone] (phone) [phone] (fax)  [facility]

## 2020-11-21 NOTE — Assessment & Plan Note (Signed)
Has tried meds before and they made her ill; has cut back on quantity Thinks she can quit with cruise given lack of free space to smoke

## 2020-11-21 NOTE — Assessment & Plan Note (Signed)
Stable Continue lexapro daily Medication refill sent

## 2020-11-21 NOTE — Assessment & Plan Note (Signed)
Much improved since gym routine started; going with her daughter

## 2020-11-22 ENCOUNTER — Telehealth: Payer: Self-pay | Admitting: *Deleted

## 2020-11-22 DIAGNOSIS — R718 Other abnormality of red blood cells: Secondary | ICD-10-CM | POA: Diagnosis not present

## 2020-11-22 DIAGNOSIS — D7589 Other specified diseases of blood and blood-forming organs: Secondary | ICD-10-CM | POA: Diagnosis not present

## 2020-11-22 DIAGNOSIS — Z0000 Encounter for general adult medical examination without abnormal findings: Secondary | ICD-10-CM | POA: Diagnosis not present

## 2020-11-22 NOTE — Chronic Care Management (AMB) (Signed)
  Chronic Care Management   Note  11/22/2020 Name: Melissa Buck MRN: [id number] DOB: June 07, 1950  Melissa Buck is a 70 y.o. year old female who is a primary care patient of Gwyneth Sprout, FNP. I reached out to [name] by phone today in response to a referral sent by Ms. Steward Drone Branscom's PCP Gwyneth Sprout, FNP     Ms. Rundquist was given information about Chronic Care Management services today including:  CCM service includes personalized support from designated clinical staff supervised by her physician, including individualized plan of care and coordination with other care providers 24/7 contact phone numbers for assistance for urgent and routine care needs. Service will only be billed when office clinical staff spend 20 minutes or more in a month to coordinate care. Only one practitioner may furnish and bill the service in a calendar month. The patient may stop CCM services at any time (effective at the end of the month) by phone call to the office staff. The patient will be responsible for cost sharing (co-pay) of up to 20% of the service fee (after annual deductible is met).  Patient agreed to services and verbal consent obtained.   Follow up plan: Telephone appointment with care management team member scheduled for: 11/30/2020, 12/01/2020, 01/02/2021  Julian Hy, [place] Management  Direct Dial: [phone]

## 2020-11-23 ENCOUNTER — Encounter: Payer: Medicare HMO | Admitting: Physical Therapy

## 2020-11-23 LAB — CBC WITH DIFFERENTIAL/PLATELET
Basophils Absolute: 0.1 10*3/uL (ref 0.0–0.2)
Basos: 1 %
EOS (ABSOLUTE): 0.1 10*3/uL (ref 0.0–0.4)
Eos: 2 %
Hematocrit: 37.9 % (ref 34.0–46.6)
Hemoglobin: 12.2 g/dL (ref 11.1–15.9)
Immature Grans (Abs): 0 10*3/uL (ref 0.0–0.1)
Immature Granulocytes: 1 %
Lymphocytes Absolute: 0.7 10*3/uL (ref 0.7–3.1)
Lymphs: 17 %
MCH: 31.9 pg (ref 26.6–33.0)
MCHC: 32.2 g/dL (ref 31.5–35.7)
MCV: 99 fL — ABNORMAL HIGH (ref 79–97)
Monocytes Absolute: 0.4 10*3/uL (ref 0.1–0.9)
Monocytes: 9 %
Neutrophils Absolute: 3 10*3/uL (ref 1.4–7.0)
Neutrophils: 70 %
Platelets: 205 10*3/uL (ref 150–450)
RBC: 3.82 x10E6/uL (ref 3.77–5.28)
RDW: 13.1 % (ref 11.7–15.4)
WBC: 4.3 10*3/uL (ref 3.4–10.8)

## 2020-11-23 LAB — COMPREHENSIVE METABOLIC PANEL
ALT: 14 IU/L (ref 0–32)
AST: 17 IU/L (ref 0–40)
Albumin/Globulin Ratio: 2 (ref 1.2–2.2)
Albumin: 4.4 g/dL (ref 3.8–4.8)
Alkaline Phosphatase: 85 IU/L (ref 44–121)
BUN/Creatinine Ratio: 11 — ABNORMAL LOW (ref 12–28)
BUN: 7 mg/dL — ABNORMAL LOW (ref 8–27)
Bilirubin Total: 0.6 mg/dL (ref 0.0–1.2)
CO2: 23 mmol/L (ref 20–29)
Calcium: 9.3 mg/dL (ref 8.7–10.3)
Chloride: 99 mmol/L (ref 96–106)
Creatinine, Ser: 0.62 mg/dL (ref 0.57–1.00)
Globulin, Total: 2.2 g/dL (ref 1.5–4.5)
Glucose: 84 mg/dL (ref 65–99)
Potassium: 4.4 mmol/L (ref 3.5–5.2)
Sodium: 141 mmol/L (ref 134–144)
Total Protein: 6.6 g/dL (ref 6.0–8.5)
eGFR: 97 mL/min/{1.73_m2} (ref >59)

## 2020-11-23 LAB — LIPID PANEL
Chol/HDL Ratio: 2.6 ratio (ref 0.0–4.4)
Cholesterol, Total: 181 mg/dL (ref 100–199)
HDL: 69 mg/dL (ref >39)
LDL Chol Calc (NIH): 78 mg/dL (ref 0–99)
Triglycerides: 207 mg/dL — ABNORMAL HIGH (ref 0–149)
VLDL Cholesterol Cal: 34 mg/dL (ref 5–40)

## 2020-11-23 LAB — TSH: TSH: 1.65 u[IU]/mL (ref 0.450–4.500)

## 2020-11-27 ENCOUNTER — Encounter: Payer: Medicare HMO | Admitting: Physical Therapy

## 2020-11-30 ENCOUNTER — Ambulatory Visit (INDEPENDENT_AMBULATORY_CARE_PROVIDER_SITE_OTHER): Payer: Medicare HMO | Admitting: *Deleted

## 2020-11-30 ENCOUNTER — Encounter: Payer: Medicare HMO | Admitting: Physical Therapy

## 2020-11-30 DIAGNOSIS — F419 Anxiety disorder, unspecified: Secondary | ICD-10-CM

## 2020-11-30 DIAGNOSIS — F32A Depression, unspecified: Secondary | ICD-10-CM

## 2020-11-30 NOTE — Patient Instructions (Signed)
Visit Information   Goals Addressed             This Visit's Progress    Manage My Emotions       Timeframe:  Long-Range Goal Priority:  Medium Start Date:     11/30/20                        Expected End Date:  01/02/21                     Follow Up Date 01/02/21    - practice positive thinking and self-talk -continue with healthy diet and exercise -continue to take medications as prescribed    Why is this important?   When you are stressed, down or upset, your body reacts too.  For example, your blood pressure may get higher; you may have a headache or stomachache.  When your emotions get the best of you, your body's ability to fight off cold and flu gets weak.  These steps will help you manage your emotions.     Notes:         The patient verbalized understanding of instructions, educational materials, and care plan provided today and declined offer to receive copy of patient instructions, educational materials, and care plan.   Telephone follow up appointment with care management team member scheduled for: 01/02/21   Verna Czech, LCSW Clinical Social Worker  [facility] [phone]

## 2020-11-30 NOTE — Chronic Care Management (AMB) (Signed)
Chronic Care Management    Clinical Social Work Note  11/30/2020 Name: Melissa Buck MRN: [id number] DOB: 1951-01-27  Melissa Buck is a 70 y.o. year old female who is a primary care patient of [name], FNP. The CCM team was consulted to assist the patient with chronic disease management and/or care coordination needs related to: Mental Health Counseling and Resources.   Engaged with patient by telephone for initial visit in response to provider referral for social work chronic care management and care coordination services.   Consent to Services:  The patient was given information about Chronic Care Management services, agreed to services, and gave verbal consent prior to initiation of services.  Please see initial visit note for detailed documentation.   Patient agreed to services and consent obtained.   Assessment: Review of patient past medical history, allergies, medications, and health status, including review of relevant consultants reports was performed today as part of a comprehensive evaluation and provision of chronic care management and care coordination services.     SDOH (Social Determinants of Health) assessments and interventions performed:    Advanced Directives Status: Not addressed in this encounter.  CCM Care Plan  Allergies  Allergen Reactions   Aspirin    Erythromycin     Allergic to Mycins.   Hydrocodone-Acetaminophen Itching    GI Upset   Sulfa Antibiotics     Throat closes up    Outpatient Encounter Medications as of 11/30/2020  Medication Sig   albuterol (PROVENTIL HFA;VENTOLIN HFA) 108 (90 Base) MCG/ACT inhaler Inhale 2 puffs into the lungs every 6 (six) hours as needed for wheezing or shortness of breath.   Calcium Carbonate-Vit D-Min (CALCIUM 1200 PO) Take by mouth daily.   cetirizine (ZYRTEC) 10 MG chewable tablet Chew 10 mg by mouth daily.   escitalopram (LEXAPRO) 20 MG tablet Take 1 tablet (20 mg total) by mouth daily.   meloxicam  (MOBIC) 15 MG tablet Take 1 tablet (15 mg total) by mouth daily as needed for pain.   metoCLOPramide (REGLAN) 10 MG tablet Take 1 tablet (10 mg total) by mouth 3 (three) times daily as needed for nausea.   Multiple Vitamin (MULTIVITAMIN) tablet Take 1 tablet by mouth daily.   scopolamine (TRANSDERM-SCOP, 1.5 MG,) 1 MG/3DAYS Place 1 patch (1.5 mg total) onto the skin every 3 (three) days.   simvastatin (ZOCOR) 20 MG tablet Take 1 tablet (20 mg total) by mouth at bedtime.   tiZANidine (ZANAFLEX) 4 MG tablet Take 1 tablet (4 mg total) by mouth every 4 (four) hours as needed (headache).   traZODone (DESYREL) 50 MG tablet Take 1 tablet (50 mg total) by mouth at bedtime as needed for sleep. TAKE 1 TABLET BY MOUTH AT BEDTIME AS NEEDED FOR SLEEP.   vitamin B-12 (CYANOCOBALAMIN) 1000 MCG tablet Take 1,000 mcg by mouth daily.   No facility-administered encounter medications on file as of 11/30/2020.    Patient Active Problem List   Diagnosis Date Noted   Annual physical exam 11/21/2020   Flu vaccine need 11/21/2020   Anxiety and depression 11/21/2020   Chronic left shoulder pain 11/21/2020   Motion sickness 11/21/2020   Encounter for tobacco use cessation counseling 11/21/2020   Nonintractable headache 10/05/2020   Personal history of colonic polyps    Hypercholesteremia 03/10/2015   Current tobacco use 03/19/2002    Conditions to be addressed/monitored: Anxiety; Mental Health Concerns   Care Plan : Anxiety (Adult)  Updates made by Wenda Overland, LCSW since  11/30/2020 12:00 AM     Problem: Symptoms (Anxiety)      Long-Range Goal: Anxiety Symptoms Monitored and Managed   Start Date: 11/30/2020  Expected End Date: 01/02/2021  Priority: Medium  Note:   Current Barriers:  Chronic Mental Health needs related to anxiety symptoms Mental Health Concerns  Suicidal Ideation/Homicidal Ideation: No  Clinical Social Work Goal(s):  Over the next 90 days, patient will work with SW monthly by  telephone or in person to reduce or manage symptoms related to anxiety patient will work with SW to address concerns related to anxiety management  Interventions: Patient interviewed and appropriate assessments performed: PHQ 2 PHQ 9 Patient acknowledged anxiety symptoms, no major trigger however reports manages by maintaining a daily exercise routine , medication adherence and a healthy diet Patient able to verbalize having a good support system and positive self care strategies- Outpatient counseling promoted-however patient states that she is not in need of outpatient mental health counseling at this time.  Adherence to medication therapy reinforced Verbalization of feelings encouraged  Active listening / Reflection utilized  Emotional Supportive Provided  Patient Self Care Activities:  Performs ADL's independently Performs IADL's independently Strong family or social support  Patient Coping Strengths:  Supportive Relationships Friends Spirituality Able to Communicate Effectively  Patient Self Care Deficits:   Initial goal documentation     Task: Alleviate Barriers to Anxiety Treatment Success        Follow Up Plan: SW will follow up with patient by phone over the next 30 business days       [place], [place] Clinical Social Worker  [facility] Care Management [phone]

## 2020-12-01 ENCOUNTER — Ambulatory Visit: Payer: Medicare HMO

## 2020-12-01 DIAGNOSIS — E7800 Pure hypercholesterolemia, unspecified: Secondary | ICD-10-CM | POA: Diagnosis not present

## 2020-12-01 DIAGNOSIS — F32A Depression, unspecified: Secondary | ICD-10-CM

## 2020-12-01 NOTE — Patient Instructions (Addendum)
Thank you for allowing the Chronic Care Management team to participate in your care.    Patient Care Plan: Hypercholesterolemia     Problem Identified: Hypercholesterolemia      Long-Range Goal: Hypercholesterolemia   Start Date: 12/01/2020  Expected End Date: 03/01/2021  Priority: Medium  Note:   Objective:  Last practice recorded BP readings:  BP Readings from Last 3 Encounters:  11/21/20 119/82  10/26/20 124/71  10/05/20 111/73   Most recent eGFR/CrCl:  Lab Results  Component Value Date   EGFR 97 11/22/2020    No components found for: CRCL  Lab Results  Component Value Date   CHOL 181 11/22/2020   HDL 69 11/22/2020   LDLCALC 78 11/22/2020   TRIG 207 (H) 11/22/2020   CHOLHDL 2.6 11/22/2020    Current Barriers:  Chronic Disease Management support and educational needs related to Hypercholesterolemia.  Case Manager Clinical Goal(s):  Over the next 90 days, patient will demonstrate improved adherence to prescribed treatment plan as evidenced by taking all medications as prescribed and adhering to low sodium/DASH diet.  Interventions:  Collaboration with Gwyneth Sprout, FNP regarding development and update of comprehensive plan of care as evidenced by provider attestation and co-signature Inter-disciplinary care team collaboration (see longitudinal plan of care) Reviewed medications and importance of compliance. Advised to continue taking medications as prescribed and notify provider if unable to tolerate prescribed regimen. Advised to monitor BP routinely Discussed compliance with recommended cardiac prudent diet. Encouraged to read nutrition labels, monitor sodium intake and avoid highly processed foods when possible. Reports improvements with nutritional intake and attempting to adhere to a heart healthy diet. Discussed increased risk for cardiac complications d/t smoking. Declined need for smoking cessation counseling or resources available through [facility]. Reviewed  s/sx of heart attack, stroke and worsening symptoms that require immediate medical attention.  Patient Goals/Self-Care Activities: Self-administer medications as prescribed Monitor and record blood pressure Adhere to recommended cardiac prudent/heart healthy diet Consider plan for smoking cessation Notify provider or care management team with questions and new concerns as needed   Follow Up Plan:  Will follow up in three months       Ms. Linehan verbalized understanding of the information discussed during the telephonic outreach. Declined need for mailed/printed instructions. A member of the care management team will follow up in three months.   [facility] [phone]

## 2020-12-01 NOTE — Chronic Care Management (AMB) (Signed)
Chronic Care Management   CCM RN Visit Note  12/01/2020 Name: Melissa Buck MRN: [id number] DOB: April 01, 1950  Subjective: Melissa Buck is a 70 y.o. year old female who is a primary care patient of Gwyneth Sprout, FNP. The care management team was consulted for assistance with disease management and care coordination needs.    Engaged with patient by telephone for initial visit in response to provider referral for case management and care coordination services.   Consent to Services:  The patient was given the following information about Chronic Care Management services: 1. CCM service includes personalized support from designated clinical staff supervised by the primary care provider, including individualized plan of care and coordination with other care providers 2. 24/7 contact phone numbers for assistance for urgent and routine care needs. 3. Service will only be billed when office clinical staff spend 20 minutes or more in a month to coordinate care. 4. Only one practitioner may furnish and bill the service in a calendar month. 5.The patient may stop CCM services at any time (effective at the end of the month) by phone call to the office staff. 6. The patient will be responsible for cost sharing (co-pay) of up to 20% of the service fee (after annual deductible is met). Patient agreed to services and consent obtained.   Assessment: Review of patient past medical history, allergies, medications, health status, including review of consultants reports, laboratory and other test data, was performed as part of comprehensive evaluation and provision of chronic care management services.   SDOH (Social Determinants of Health) assessments and interventions performed:  SDOH Interventions    Flowsheet Row Most Recent Value  SDOH Interventions   Food Insecurity Interventions Intervention Not Indicated  Transportation Interventions Intervention Not Indicated        CCM Care Plan  Allergies   Allergen Reactions   Aspirin    Erythromycin     Allergic to Mycins.   Hydrocodone-Acetaminophen Itching    GI Upset   Sulfa Antibiotics     Throat closes up    Outpatient Encounter Medications as of 12/01/2020  Medication Sig   escitalopram (LEXAPRO) 20 MG tablet Take 1 tablet (20 mg total) by mouth daily.   meloxicam (MOBIC) 15 MG tablet Take 1 tablet (15 mg total) by mouth daily as needed for pain.   metoCLOPramide (REGLAN) 10 MG tablet Take 1 tablet (10 mg total) by mouth 3 (three) times daily as needed for nausea.   Multiple Vitamin (MULTIVITAMIN) tablet Take 1 tablet by mouth daily.   simvastatin (ZOCOR) 20 MG tablet Take 1 tablet (20 mg total) by mouth at bedtime.   tiZANidine (ZANAFLEX) 4 MG tablet Take 1 tablet (4 mg total) by mouth every 4 (four) hours as needed (headache).   traZODone (DESYREL) 50 MG tablet Take 1 tablet (50 mg total) by mouth at bedtime as needed for sleep. TAKE 1 TABLET BY MOUTH AT BEDTIME AS NEEDED FOR SLEEP.   vitamin B-12 (CYANOCOBALAMIN) 1000 MCG tablet Take 1,000 mcg by mouth daily.   albuterol (PROVENTIL HFA;VENTOLIN HFA) 108 (90 Base) MCG/ACT inhaler Inhale 2 puffs into the lungs every 6 (six) hours as needed for wheezing or shortness of breath.   Calcium Carbonate-Vit D-Min (CALCIUM 1200 PO) Take by mouth daily. (Patient not taking: Reported on 12/01/2020)   cetirizine (ZYRTEC) 10 MG chewable tablet Chew 10 mg by mouth daily.   scopolamine (TRANSDERM-SCOP, 1.5 MG,) 1 MG/3DAYS Place 1 patch (1.5 mg total) onto the skin every 3 (  three) days.   No facility-administered encounter medications on file as of 12/01/2020.    Patient Active Problem List   Diagnosis Date Noted   Annual physical exam 11/21/2020   Flu vaccine need 11/21/2020   Anxiety and depression 11/21/2020   Chronic left shoulder pain 11/21/2020   Motion sickness 11/21/2020   Encounter for tobacco use cessation counseling 11/21/2020   Nonintractable headache 10/05/2020   Personal  history of colonic polyps    Hypercholesteremia 03/10/2015   Current tobacco use 03/19/2002    Conditions to be addressed/monitored: Hypercholesterolemia Patient Care Plan: Hypercholesterolemia     Problem Identified: Hypercholesterolemia      Long-Range Goal: Hypercholesterolemia   Start Date: 12/01/2020  Expected End Date: 03/01/2021  Priority: Medium  Note:   Objective:  Last practice recorded BP readings:  BP Readings from Last 3 Encounters:  11/21/20 119/82  10/26/20 124/71  10/05/20 111/73   Most recent eGFR/CrCl:  Lab Results  Component Value Date   EGFR 97 11/22/2020    No components found for: CRCL  Lab Results  Component Value Date   CHOL 181 11/22/2020   HDL 69 11/22/2020   LDLCALC 78 11/22/2020   TRIG 207 (H) 11/22/2020   CHOLHDL 2.6 11/22/2020    Current Barriers:  Chronic Disease Management support and educational needs related to Hypercholesterolemia.  Case Manager Clinical Goal(s):  Over the next 90 days, patient will demonstrate improved adherence to prescribed treatment plan as evidenced by taking all medications as prescribed and adhering to low sodium/DASH diet.  Interventions:  Collaboration with Gwyneth Sprout, FNP regarding development and update of comprehensive plan of care as evidenced by provider attestation and co-signature Inter-disciplinary care team collaboration (see longitudinal plan of care) Reviewed medications and importance of compliance. Advised to continue taking medications as prescribed and notify provider if unable to tolerate prescribed regimen. Advised to monitor BP routinely Discussed compliance with recommended cardiac prudent diet. Encouraged to read nutrition labels, monitor sodium intake and avoid highly processed foods when possible. Reports improvements with nutritional intake and attempting to adhere to a heart healthy diet. Discussed increased risk for cardiac complications d/t smoking. Declined need for smoking  cessation counseling or resources available through [facility]. Reviewed s/sx of heart attack, stroke and worsening symptoms that require immediate medical attention.  Patient Goals/Self-Care Activities: Self-administer medications as prescribed Monitor and record blood pressure Adhere to recommended cardiac prudent/heart healthy diet Consider plan for smoking cessation Notify provider or care management team with questions and new concerns as needed   Follow Up Plan:  Will follow up in three months       PLAN: A member of the care management team will follow up in three months.   [facility] [phone]

## 2020-12-26 ENCOUNTER — Telehealth: Payer: Medicare HMO

## 2021-01-02 ENCOUNTER — Other Ambulatory Visit: Payer: Self-pay | Admitting: Family Medicine

## 2021-01-02 ENCOUNTER — Telehealth: Payer: Self-pay

## 2021-01-02 ENCOUNTER — Ambulatory Visit (INDEPENDENT_AMBULATORY_CARE_PROVIDER_SITE_OTHER): Payer: Medicare HMO | Admitting: *Deleted

## 2021-01-02 ENCOUNTER — Ambulatory Visit: Payer: Medicare HMO

## 2021-01-02 DIAGNOSIS — F32A Depression, unspecified: Secondary | ICD-10-CM

## 2021-01-02 DIAGNOSIS — M8580 Other specified disorders of bone density and structure, unspecified site: Secondary | ICD-10-CM | POA: Insufficient documentation

## 2021-01-02 DIAGNOSIS — F419 Anxiety disorder, unspecified: Secondary | ICD-10-CM

## 2021-01-02 DIAGNOSIS — J301 Allergic rhinitis due to pollen: Secondary | ICD-10-CM

## 2021-01-02 DIAGNOSIS — Z720 Tobacco use: Secondary | ICD-10-CM

## 2021-01-02 DIAGNOSIS — E7800 Pure hypercholesterolemia, unspecified: Secondary | ICD-10-CM

## 2021-01-02 MED ORDER — CETIRIZINE HCL 10 MG PO TABS: 10.0000 mg | ORAL_TABLET | Freq: Every day | ORAL | 3 refills | Status: DC

## 2021-01-02 MED ORDER — OMEPRAZOLE 40 MG PO CPDR: 40.0000 mg | DELAYED_RELEASE_CAPSULE | Freq: Every day | ORAL | 0 refills | Status: DC

## 2021-01-02 NOTE — Chronic Care Management (AMB) (Signed)
Chronic Care Management    Clinical Social Work Note  01/02/2021 Name: Melissa Buck MRN: [id number] DOB: November 26, 1950  Melissa Buck is a 70 y.o. year old female who is a primary care patient of [name], FNP. The CCM team was consulted to assist the patient with chronic disease management and/or care coordination needs related to: Mental Health Counseling and Resources.   Engaged with patient by telephone for follow up visit in response to provider referral for social work chronic care management and care coordination services.   Consent to Services:  The patient was given information about Chronic Care Management services, agreed to services, and gave verbal consent prior to initiation of services.  Please see initial visit note for detailed documentation.   Patient agreed to services and consent obtained.   Assessment: Review of patient past medical history, allergies, medications, and health status, including review of relevant consultants reports was performed today as part of a comprehensive evaluation and provision of chronic care management and care coordination services.     SDOH (Social Determinants of Health) assessments and interventions performed:    Advanced Directives Status: Not addressed in this encounter.  CCM Care Plan  Allergies  Allergen Reactions   Aspirin    Erythromycin     Allergic to Mycins.   Hydrocodone-Acetaminophen Itching    GI Upset   Sulfa Antibiotics     Throat closes up    Outpatient Encounter Medications as of 01/02/2021  Medication Sig Note   albuterol (PROVENTIL HFA;VENTOLIN HFA) 108 (90 Base) MCG/ACT inhaler Inhale 2 puffs into the lungs every 6 (six) hours as needed for wheezing or shortness of breath. (Patient not taking: Reported on 01/02/2021) 01/02/2021: Hasn't needed in some time   cetirizine (ZYRTEC) 10 MG chewable tablet Chew 10 mg by mouth daily.    escitalopram (LEXAPRO) 20 MG tablet Take 1 tablet (20 mg total) by mouth  daily.    meloxicam (MOBIC) 15 MG tablet Take 1 tablet (15 mg total) by mouth daily as needed for pain.    metoCLOPramide (REGLAN) 10 MG tablet Take 1 tablet (10 mg total) by mouth 3 (three) times daily as needed for nausea.    Multiple Vitamin (MULTIVITAMIN) tablet Take 1 tablet by mouth daily.    omeprazole (PRILOSEC) 40 MG capsule Take 1 capsule (40 mg total) by mouth daily.    Probiotic Product (PROBIOTIC ADVANCED PO) Take 1 capsule by mouth.    scopolamine (TRANSDERM-SCOP, 1.5 MG,) 1 MG/3DAYS Place 1 patch (1.5 mg total) onto the skin every 3 (three) days. (Patient not taking: Reported on 01/02/2021)    simvastatin (ZOCOR) 20 MG tablet Take 1 tablet (20 mg total) by mouth at bedtime.    tiZANidine (ZANAFLEX) 4 MG tablet Take 1 tablet (4 mg total) by mouth every 4 (four) hours as needed (headache). (Patient taking differently: Take 4 mg by mouth every 4 (four) hours as needed (migraine).)    traZODone (DESYREL) 50 MG tablet Take 1 tablet (50 mg total) by mouth at bedtime as needed for sleep. TAKE 1 TABLET BY MOUTH AT BEDTIME AS NEEDED FOR SLEEP.    vitamin B-12 (CYANOCOBALAMIN) 1000 MCG tablet Take 1,000 mcg by mouth daily.    [DISCONTINUED] Calcium Carbonate-Vit D-Min (CALCIUM 1200 PO) Take by mouth daily. (Patient not taking: Reported on 12/01/2020)    No facility-administered encounter medications on file as of 01/02/2021.    Patient Active Problem List   Diagnosis Date Noted   Annual physical exam 11/21/2020  Flu vaccine need 11/21/2020   Anxiety and depression 11/21/2020   Chronic left shoulder pain 11/21/2020   Motion sickness 11/21/2020   Encounter for tobacco use cessation counseling 11/21/2020   Nonintractable headache 10/05/2020   Personal history of colonic polyps    Hypercholesteremia 03/10/2015   Current tobacco use 03/19/2002    Conditions to be addressed/monitored: Anxiety; Mental Health Concerns   Care Plan : Anxiety (Adult)  Updates made by Wenda Overland, LCSW  since 01/02/2021 12:00 AM     Problem: Symptoms (Anxiety)      Long-Range Goal: Anxiety Symptoms Monitored and Managed   Start Date: 11/30/2020  Expected End Date: 01/02/2021  This Visit's Progress: On track  Priority: Medium  Note:   Current Barriers:  Chronic Mental Health needs related to anxiety symptoms Mental Health Concerns  Suicidal Ideation/Homicidal Ideation: No  Clinical Social Work Goal(s):  Over the next 90 days, patient will work with SW monthly by telephone or in person to reduce or manage symptoms related to anxiety patient will work with SW to address concerns related to anxiety management  Interventions: Patient interviewed and appropriate assessments performed:  Patient acknowledged ability to manage anxiety symptoms with daily exercise routine , medication adherence and a healthy diet Patient continues to verbalize having a good support system and positive self care strategies- Outpatient counseling offered-however patient states that she is not in need of outpatient mental health counseling at this time.  Adherence to medication therapy reinforced Additional community/mental health needs explored-patient confirmed managing anxiety well, no further needs at this time  Patient Self Care Activities:  Performs ADL's independently Performs IADL's independently Strong family or social support  Patient Coping Strengths:  Supportive Relationships Friends Spirituality Able to Communicate Effectively  Patient Self Care Deficits:   Please see past updates related to this goal by clicking on the "Past Updates" button in the selected goal        Follow Up Plan: Client will continue to follow up  the CCM RNCM and Pharmacist. Patient to contact this social worker with any additional community resource needs.       Verna Czech, LCSW Clinical Social Worker  [facility] [phone]

## 2021-01-02 NOTE — Progress Notes (Signed)
    Chronic Care Management Pharmacy Assistant   Name: Melissa Buck  MRN: [id number] DOB: 08/05/1950  Initial Visit with Angelena Sole, CPP 01/02/2021 @ 1000 No Initial Questions as patient was not contacted prior to today's appointment  Conditions to be addressed/monitored: Anxiety, Depression, and Hypercholesteremia, Nonintractable headache, Chronic left shoulder pain, Motion sickness,   Primary concerns for visit include: Did not speak with patient since her visit is today.   Recent office visits:  11/21/2020 Merita Norton, FNP (PCP Office Visit) for Annual Physical Exam- Started: Meloxicam 15 mg daily prn, Scopolamine Base 1.5 mg Transdermal q72h,  Changed:  Trazodone HCl to 50 mg at bedtime prn for sleep; lab order placed, Referral to [facility] Coordination; patient instructed to return in 1 year  10/05/2020 Merita Norton, FNP (PCP Office Visit) for Follow-up- Started: Metoclopramide HCl 10 mg three times daily prn, Changed: Tizanidine HCl 4 mg every four hours prn, Discontinued: Gentamicin Sulfate 0.1%, Hydroxyzine HCl 10 mg, and Probiotic Product due to patient not taking. Physical Therapy Referral Placed. Patient instructed to return in 6 weeks.  07/11/2020 Dortha Kern, PA-C (PCP Office Visit) for Anxiety & Depression- Started: Escitalopram Oxalate 20 mg daily, Discontinued:  Bupropion HCl 150 mg, Meloxicam 15 mg, No orders placed, no follow-up noted  Recent consult visits:  10/26/2020 Ellin Goodie, PT (Physical Therapy) for PT Initial Evaluation- No medication changes noted, No orders placed  Hospital visits:  None in previous 6 months  Medications: Outpatient Encounter Medications as of 01/02/2021  Medication Sig   albuterol (PROVENTIL HFA;VENTOLIN HFA) 108 (90 Base) MCG/ACT inhaler Inhale 2 puffs into the lungs every 6 (six) hours as needed for wheezing or shortness of breath.   Calcium Carbonate-Vit D-Min (CALCIUM 1200 PO) Take by mouth daily. (Patient not taking:  Reported on 12/01/2020)   cetirizine (ZYRTEC) 10 MG chewable tablet Chew 10 mg by mouth daily.   escitalopram (LEXAPRO) 20 MG tablet Take 1 tablet (20 mg total) by mouth daily.   meloxicam (MOBIC) 15 MG tablet Take 1 tablet (15 mg total) by mouth daily as needed for pain.   metoCLOPramide (REGLAN) 10 MG tablet Take 1 tablet (10 mg total) by mouth 3 (three) times daily as needed for nausea.   Multiple Vitamin (MULTIVITAMIN) tablet Take 1 tablet by mouth daily.   scopolamine (TRANSDERM-SCOP, 1.5 MG,) 1 MG/3DAYS Place 1 patch (1.5 mg total) onto the skin every 3 (three) days.   simvastatin (ZOCOR) 20 MG tablet Take 1 tablet (20 mg total) by mouth at bedtime.   tiZANidine (ZANAFLEX) 4 MG tablet Take 1 tablet (4 mg total) by mouth every 4 (four) hours as needed (headache).   traZODone (DESYREL) 50 MG tablet Take 1 tablet (50 mg total) by mouth at bedtime as needed for sleep. TAKE 1 TABLET BY MOUTH AT BEDTIME AS NEEDED FOR SLEEP.   vitamin B-12 (CYANOCOBALAMIN) 1000 MCG tablet Take 1,000 mcg by mouth daily.   No facility-administered encounter medications on file as of 01/02/2021.   Care Gaps: Zoster Vaccines COVID-19 Vaccine Booster 3  Star Rating Drugs: Simvastatin 20 mg last filled on 09/25/2020 for a 90-Day Supply with [facility]  Adelene Idler, CPA/CMA Clinical Pharmacist Assistant Phone: [phone]
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
## 2021-01-02 NOTE — Patient Instructions (Signed)
Visit Information  PATIENT GOALS/PLAN OF CARE:  Care Plan : Anxiety (Adult)  Updates made by Wenda Overland, LCSW since 01/02/2021 12:00 AM     Problem: Symptoms (Anxiety)      Long-Range Goal: Anxiety Symptoms Monitored and Managed   Start Date: 11/30/2020  Expected End Date: 01/02/2021  This Visit's Progress: On track  Priority: Medium  Note:   Current Barriers:  Chronic Mental Health needs related to anxiety symptoms Mental Health Concerns  Suicidal Ideation/Homicidal Ideation: No  Clinical Social Work Goal(s):  Over the next 90 days, patient will work with SW monthly by telephone or in person to reduce or manage symptoms related to anxiety patient will work with SW to address concerns related to anxiety management  Interventions: Patient interviewed and appropriate assessments performed:  Patient acknowledged ability to manage anxiety symptoms with daily exercise routine , medication adherence and a healthy diet Patient continues to verbalize having a good support system and positive self care strategies- Outpatient counseling offered-however patient states that she is not in need of outpatient mental health counseling at this time.  Adherence to medication therapy reinforced Additional community/mental health needs explored-patient confirmed managing anxiety well, no further needs at this time  Patient Self Care Activities:  Performs ADL's independently Performs IADL's independently Strong family or social support  Patient Coping Strengths:  Supportive Relationships Friends Spirituality Able to Communicate Effectively  Patient Self Care Deficits:   Please see past updates related to this goal by clicking on the "Past Updates" button in the selected goal      The patient verbalized understanding of instructions, educational materials, and care plan provided today and declined offer to receive copy of patient instructions, educational materials, and care plan.    No further follow up required: patient to continue to follow up with CCM pharmacist and RNCM     Verna Czech, [place] Clinical Social Worker  [facility] Care Management [phone]
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
## 2021-01-04 ENCOUNTER — Ambulatory Visit: Payer: Self-pay | Admitting: *Deleted

## 2021-01-04 DIAGNOSIS — M25512 Pain in left shoulder: Secondary | ICD-10-CM

## 2021-01-04 DIAGNOSIS — G8929 Other chronic pain: Secondary | ICD-10-CM
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
## 2021-01-05 NOTE — Telephone Encounter (Signed)
Patient advised and request appt to see Dr. Hyacinth Meeker. KW

## 2021-01-05 NOTE — Addendum Note (Signed)
Addended by: Fonda Kinder on: 01/05/2021 11:26 AM   Modules accepted: Orders

## 2021-01-08 DIAGNOSIS — M7542 Impingement syndrome of left shoulder: Secondary | ICD-10-CM | POA: Diagnosis not present

## 2021-01-31 DIAGNOSIS — F32A Depression, unspecified: Secondary | ICD-10-CM

## 2021-01-31 DIAGNOSIS — F419 Anxiety disorder, unspecified: Secondary | ICD-10-CM

## 2021-01-31 DIAGNOSIS — E7800 Pure hypercholesterolemia, unspecified: Secondary | ICD-10-CM | POA: Diagnosis not present

## 2021-02-08 ENCOUNTER — Ambulatory Visit
Admission: RE | Admit: 2021-02-08 | Discharge: 2021-02-08 | Disposition: A | Discharge status: Home / Self Care | Payer: Medicare HMO | Source: Ambulatory Visit | Attending: Family Medicine | Admitting: Family Medicine

## 2021-02-08 ENCOUNTER — Other Ambulatory Visit: Payer: Self-pay

## 2021-02-08 DIAGNOSIS — Z780 Asymptomatic menopausal state: Secondary | ICD-10-CM | POA: Diagnosis not present

## 2021-02-08 DIAGNOSIS — F17200 Nicotine dependence, unspecified, uncomplicated: Secondary | ICD-10-CM | POA: Diagnosis not present

## 2021-02-08 DIAGNOSIS — M8589 Other specified disorders of bone density and structure, multiple sites: Secondary | ICD-10-CM | POA: Insufficient documentation

## 2021-02-08 DIAGNOSIS — Z13820 Encounter for screening for osteoporosis: Secondary | ICD-10-CM | POA: Insufficient documentation

## 2021-02-08 DIAGNOSIS — M8580 Other specified disorders of bone density and structure, unspecified site: Secondary | ICD-10-CM

## 2021-02-08 DIAGNOSIS — J449 Chronic obstructive pulmonary disease, unspecified: Secondary | ICD-10-CM | POA: Insufficient documentation

## 2021-02-20 ENCOUNTER — Telehealth: Payer: Medicare HMO

## 2021-03-01 ENCOUNTER — Ambulatory Visit (INDEPENDENT_AMBULATORY_CARE_PROVIDER_SITE_OTHER): Payer: Medicare HMO

## 2021-03-01 DIAGNOSIS — E7800 Pure hypercholesterolemia, unspecified: Secondary | ICD-10-CM

## 2021-03-01 DIAGNOSIS — J301 Allergic rhinitis due to pollen: Secondary | ICD-10-CM

## 2021-03-01 NOTE — Patient Instructions (Signed)
Thank you for allowing the Chronic Care Management team to participate in your care.  

## 2021-03-01 NOTE — Chronic Care Management (AMB) (Signed)
Chronic Care Management   CCM RN Visit Note  03/01/2021 Name: Melissa Buck MRN: [id number] DOB: [birth year]  Subjective: Melissa Buck is a 70 y.o. year old female who is a primary care patient of [name], FNP. The care management team was consulted for assistance with disease management and care coordination needs.    Engaged with patient by telephone for follow up visit in response to provider referral for case management and care coordination services.   Consent to Services:  The patient was given information about Chronic Care Management services, agreed to services, and gave verbal consent prior to initiation of services.  Please see initial visit note for detailed documentation.    Assessment: Review of patient past medical history, allergies, medications, health status, including review of consultants reports, laboratory and other test data, was performed as part of comprehensive evaluation and provision of chronic care management services.   SDOH (Social Determinants of Health) assessments and interventions performed: No  CCM Care Plan  Allergies  Allergen Reactions   Aspirin    Erythromycin     Allergic to Mycins.   Hydrocodone-Acetaminophen Itching    GI Upset   Sulfa Antibiotics     Throat closes up    Outpatient Encounter Medications as of 03/01/2021  Medication Sig Note   albuterol (PROVENTIL HFA;VENTOLIN HFA) 108 (90 Base) MCG/ACT inhaler Inhale 2 puffs into the lungs every 6 (six) hours as needed for wheezing or shortness of breath. (Patient not taking: Reported on 01/02/2021) 01/02/2021: Hasn't needed in some time   cetirizine (ZYRTEC) 10 MG tablet Take 1 tablet (10 mg total) by mouth daily.    escitalopram (LEXAPRO) 20 MG tablet Take 1 tablet (20 mg total) by mouth daily.    meloxicam (MOBIC) 15 MG tablet Take 1 tablet (15 mg total) by mouth daily as needed for pain.    metoCLOPramide (REGLAN) 10 MG tablet Take 1 tablet (10 mg total) by mouth 3 (three)  times daily as needed for nausea.    Multiple Vitamin (MULTIVITAMIN) tablet Take 1 tablet by mouth daily.    omeprazole (PRILOSEC) 40 MG capsule Take 1 capsule (40 mg total) by mouth daily.    Probiotic Product (PROBIOTIC ADVANCED PO) Take 1 capsule by mouth.    scopolamine (TRANSDERM-SCOP, 1.5 MG,) 1 MG/3DAYS Place 1 patch (1.5 mg total) onto the skin every 3 (three) days. (Patient not taking: Reported on 01/02/2021)    simvastatin (ZOCOR) 20 MG tablet Take 1 tablet (20 mg total) by mouth at bedtime.    tiZANidine (ZANAFLEX) 4 MG tablet Take 1 tablet (4 mg total) by mouth every 4 (four) hours as needed (headache). (Patient taking differently: Take 4 mg by mouth every 4 (four) hours as needed (migraine).)    traZODone (DESYREL) 50 MG tablet Take 1 tablet (50 mg total) by mouth at bedtime as needed for sleep. TAKE 1 TABLET BY MOUTH AT BEDTIME AS NEEDED FOR SLEEP.    vitamin B-12 (CYANOCOBALAMIN) 1000 MCG tablet Take 1,000 mcg by mouth daily.    No facility-administered encounter medications on file as of 03/01/2021.    Patient Active Problem List   Diagnosis Date Noted   Osteopenia 01/02/2021   Annual physical exam 11/21/2020   Flu vaccine need 11/21/2020   Anxiety and depression 11/21/2020   Chronic left shoulder pain 11/21/2020   Motion sickness 11/21/2020   Encounter for tobacco use cessation counseling 11/21/2020   Nonintractable headache 10/05/2020   Personal history of colonic polyps  Hypercholesteremia 03/10/2015   Current tobacco use 03/19/2002    Patient Care Plan: RN Care Management Plan of Care     Problem Identified: Hyperlipidemia and Tobacco Use      Long-Range Goal: Disease Progression Prevented or Minimized   Start Date: 03/01/2021  Expected End Date: 05/30/2021  Priority: High  Note:   Current Barriers:  Chronic Disease Management support and education needs related to HLD and Tobacco Use   RNCM Clinical Goal(s):  Patient will demonstrate Improved adherence  to prescribed treatment plan for HLD and Tobacco Use through collaboration with the provider, RN Care Manager and the care team.   Interventions: 1:1 collaboration with primary care provider regarding development and update of comprehensive plan of care as evidenced by provider attestation and co-signature Inter-disciplinary care team collaboration (see longitudinal plan of care) Evaluation of current treatment plan related to  self management and patient's adherence to plan as established by provider   Hyperlipidemia Interventions:   Lab Results  Component Value Date   CHOL 181 11/22/2020   HDL 69 11/22/2020   LDLCALC 78 11/22/2020   TRIG 207 (H) 11/22/2020   CHOLHDL 2.6 11/22/2020    Medications reviewed  Reviewed provider established cholesterol goals  Discussed importance of regular laboratory monitoring as prescribed Reviewed role and benefits of statin for ASCVD risk reduction Reviewed importance of limiting foods high in cholesterol Reviewed exercise goals and target of 150 minutes per week  Smoking Cessation Interventions:   Reviewed smoking history Discussed goals for smoking cessation. Reports significant improvements with decreasing daily use. Reports most days she only smokes one cigarette. Reports a few days over the past month when she smoked two or three. She is very motivated to improve. Discussed strategies to reach goal. She reports working out regularly and improving her eating habits. Reports engaging in low impact activities and stretching on days when she is not able to go to the gym. Reports her daughter has provided good support with helping her obtain her goals.  Discussed option for smoking cessation counseling. Declined need for counseling. She will continue to exercise and decrease daily intake. Remains very motivated to improve her overall health.   Patient Goals/Self-Care Activities: Take all medications as prescribed Attend all scheduled provider  appointments Call pharmacy for medication refills 3-7 days in advance of running out of medications Call provider office for new concerns or questions    Follow Up Plan:   Will follow up in two months      PLAN: A member of the care management team will follow up in two months.   [facility] Care Management [facility] [phone]

## 2021-03-03 DIAGNOSIS — E7800 Pure hypercholesterolemia, unspecified: Secondary | ICD-10-CM

## 2021-03-13 ENCOUNTER — Telehealth: Payer: Self-pay

## 2021-03-13 NOTE — Progress Notes (Signed)
Per Clinical Pharmacist, please reschedule patient appointment on 04/06/2021.  Reschedule Telephone follow up appointment with Care management team member to  : 04/17/2021 at 11:00 am.  Everlean Cherry Clinical Pharmacist Assistant [phone]

## 2021-04-04 ENCOUNTER — Other Ambulatory Visit: Payer: Self-pay | Admitting: Family Medicine

## 2021-04-05 NOTE — Telephone Encounter (Signed)
Requested Prescriptions  Pending Prescriptions Disp Refills   omeprazole (PRILOSEC) 40 MG capsule [Pharmacy Med Name: OMEPRAZOLE 40 MG Capsule Delayed Release] 90 capsule 0    Sig: TAKE 1 CAPSULE EVERY DAY     Gastroenterology: Proton Pump Inhibitors Passed - 04/04/2021 11:33 PM      Passed - Valid encounter within last 12 months    Recent Outpatient Visits          6 months ago Sciatica associated with disorder of lumbosacral spine   [facility] [name], FNP   8 months ago Anxiety and depression   [facility] [place] Chrismon, Jodell Cipro, PA-C   11 months ago Anxiety   [facility] [place], [place], [place]   1 year ago Chronic left shoulder pain   [facility] Osvaldo Angst M, [place]   1 year ago Bilateral foot pain   [facility] [place], Ricki Rodriguez [place], [place]

## 2021-04-06 ENCOUNTER — Telehealth: Payer: Medicare HMO

## 2021-04-16 ENCOUNTER — Telehealth: Payer: Self-pay

## 2021-04-16 NOTE — Progress Notes (Signed)
Chronic Care Management APPOINTMENT REMINDER   Melissa Buck was reminded to have all medications, supplements and any blood glucose and blood pressure readings available for review with Angelena Sole, Pharm. D, at her telephone visit on 04/17/2021 at 11:00 am.  Patient Confirm Appointment  Everlean Cherry Clinical Pharmacist Assistant [phone]

## 2021-04-17 ENCOUNTER — Ambulatory Visit (INDEPENDENT_AMBULATORY_CARE_PROVIDER_SITE_OTHER): Payer: Medicare HMO

## 2021-04-17 DIAGNOSIS — Z720 Tobacco use: Secondary | ICD-10-CM

## 2021-04-17 DIAGNOSIS — M8589 Other specified disorders of bone density and structure, multiple sites: Secondary | ICD-10-CM

## 2021-04-17 DIAGNOSIS — E7800 Pure hypercholesterolemia, unspecified: Secondary | ICD-10-CM

## 2021-04-17 NOTE — Progress Notes (Signed)
Chronic Care Management Pharmacy Note  04/17/2021 Name:  Melissa Buck MRN:  [id number] DOB:  1950-08-06  Summary: Patient presents for CCM follow-up. Osteopenia stable, patient has started vitamin D supplement as recommended. She continues to smoke 2 cigarettes daily.   Recommendations/Changes made from today's visit: Continue current medications  Plan: CPP follow-up 6 months.   Recommended Problem List Changes:  Add: Primary Insomnia Acid Reflux  Migraine with aura without status migrainosus with intractable headache   Subjective: Melissa Buck is an 71 y.o. year old female who is a primary patient of Gwyneth Sprout, FNP.  The CCM team was consulted for assistance with disease management and care coordination needs.    Engaged with patient by telephone for follow up visit in response to provider referral for pharmacy case management and/or care coordination services.   Consent to Services:  The patient was given information about Chronic Care Management services, agreed to services, and gave verbal consent prior to initiation of services.  Please see initial visit note for detailed documentation.   Patient Care Team: Gwyneth Sprout, FNP as PCP - General (Family Medicine) Caryn Section Kirstie Peri, MD as Referring Physician (Family Medicine) Ocie Doyne, [place] (Optometry) Edrick Kins, DPM as Consulting Physician (Podiatry) Earnestine Leys, MD (Orthopedic Surgery) Vern Claude, LCSW as Social Worker Germaine Pomfret, [facility] (Pharmacist) Neldon Labella, RN as Case Manager  Recent office visits: 11/21/2020 Tally Joe, [place] (PCP Office Visit) for Annual Physical Exam- Started: Meloxicam 15 mg daily prn, Scopolamine Base 1.5 mg Transdermal q72h,  Changed:  Trazodone HCl to 50 mg at bedtime prn for sleep; lab order placed, Referral to [place]; patient instructed to return in 1 year  10/05/2020 Tally Joe, [place] (PCP Office Visit) for Follow-up- Started:  Metoclopramide HCl 10 mg three times daily prn, Changed: Tizanidine HCl 4 mg every four hours prn, Discontinued: Gentamicin Sulfate 0.1%, Hydroxyzine HCl 10 mg, and Probiotic Product due to patient not taking. Physical Therapy Referral Placed. Patient instructed to return in 6 weeks.   07/11/2020 Vernie Murders, PA-C (PCP Office Visit) for Anxiety & Depression- Started: Escitalopram Oxalate 20 mg daily, Discontinued:  Bupropion HCl 150 mg, Meloxicam 15 mg, No orders placed, no follow-up noted  Recent consult visits: 10/26/2020 Bradly Chris, PT (Physical Therapy) for PT Initial Evaluation- No medication changes noted, No orders placed  Hospital visits: None in previous 6 months   Objective:  Lab Results  Component Value Date   CREATININE 0.62 11/22/2020   BUN 7 (L) 11/22/2020   GFRNONAA >60 11/30/2019   GFRAA >60 11/30/2019   NA 141 11/22/2020   K 4.4 11/22/2020   CALCIUM 9.3 11/22/2020   CO2 23 11/22/2020   GLUCOSE 84 11/22/2020    No results found for: HGBA1C, FRUCTOSAMINE, GFR, MICROALBUR  Last diabetic Eye exam: No results found for: HMDIABEYEEXA  Last diabetic Foot exam: No results found for: HMDIABFOOTEX   Lab Results  Component Value Date   CHOL 181 11/22/2020   HDL 69 11/22/2020   LDLCALC 78 11/22/2020   TRIG 207 (H) 11/22/2020   CHOLHDL 2.6 11/22/2020    Hepatic Function Latest Ref Rng & Units 11/22/2020 11/30/2019 07/08/2019  Total Protein 6.0 - 8.5 g/dL 6.6 7.0 7.2  Albumin 3.8 - 4.8 g/dL 4.4 4.0 4.7  AST 0 - 40 IU/L '17 28 25  ' ALT 0 - 32 IU/L '14 26 21  ' Alk Phosphatase 44 - 121 IU/L 85 82 110  Total Bilirubin 0.0 -  1.2 mg/dL 0.6 0.6 0.5    Lab Results  Component Value Date/Time   TSH 1.650 11/22/2020 02:21 PM   TSH 1.970 07/08/2019 03:15 PM    CBC Latest Ref Rng & Units 11/22/2020 11/30/2019 07/08/2019  WBC 3.4 - 10.8 x10E3/uL 4.3 4.2 6.1  Hemoglobin 11.1 - 15.9 g/dL 12.2 12.3 12.9  Hematocrit 34.0 - 46.6 % 37.9 36.1 38.0  Platelets 150 - 450 x10E3/uL 205  230 309    No results found for: VD25OH  Clinical ASCVD: No  The 10-year ASCVD risk score (Arnett DK, et al., 2019) is: 11.2%   Values used to calculate the score:     Age: 43 years     Sex: Female     Is Non-Hispanic African American: No     Diabetic: No     Tobacco smoker: Yes     Systolic Blood Pressure: 431 mmHg     Is BP treated: No     HDL Cholesterol: 69 mg/dL     Total Cholesterol: 181 mg/dL    Depression screen [facility] 2/9 12/01/2020 11/30/2020 11/21/2020  Decreased Interest 0 0 0  Down, Depressed, Hopeless 0 0 0  PHQ - 2 Score 0 0 0  Altered sleeping - - 0  Tired, decreased energy - - 0  Change in appetite - - 0  Feeling bad or failure about yourself  - - 0  Trouble concentrating - - 0  Moving slowly or fidgety/restless - - 0  Suicidal thoughts - - 0  PHQ-9 Score - - 0  Difficult doing work/chores - - Not difficult at all    -Last DEXA Scan: 02/08/21   T-Score femoral neck: -1.7  T-Score total hip: -1.0  T-Score lumbar spine: -1.7  T-Score forearm radius: NA  10-year probability of major osteoporotic fracture: 16.6%  10-year probability of hip fracture: 2.5%  Social History   Tobacco Use  Smoking Status Every Day   Packs/day: 0.25   Years: 50.00   Pack years: 12.50   Types: Cigarettes  Smokeless Tobacco Never  Tobacco Comments   2 cigarettes daily.   BP Readings from Last 3 Encounters:  11/21/20 119/82  10/26/20 124/71  10/05/20 111/73   Pulse Readings from Last 3 Encounters:  11/21/20 70  10/26/20 67  10/05/20 80   Wt Readings from Last 3 Encounters:  11/21/20 145 lb 9.6 oz (66 kg)  10/05/20 146 lb (66.2 kg)  11/30/19 140 lb (63.5 kg)   BMI Readings from Last 3 Encounters:  11/21/20 27.51 kg/m  10/05/20 27.59 kg/m  11/30/19 26.45 kg/m    Assessment/Interventions: Review of patient past medical history, allergies, medications, health status, including review of consultants reports, laboratory and other test data, was performed as part of  comprehensive evaluation and provision of chronic care management services.   SDOH:  (Social Determinants of Health) assessments and interventions performed: Yes SDOH Interventions    Flowsheet Row Most Recent Value  SDOH Interventions   Financial Strain Interventions Intervention Not Indicated       SDOH Screenings   Alcohol Screen: Low Risk    Last Alcohol Screening Score (AUDIT): 1  Depression (PHQ2-9): Low Risk    PHQ-2 Score: 0  Financial Resource Strain: Low Risk    Difficulty of Paying Living Expenses: Not hard at all  Food Insecurity: No Food Insecurity   Worried About Charity fundraiser in the Last Year: Never true   Ran Out of Food in the Last Year: Never true  Housing: Low Risk    Last Housing Risk Score: 0  Physical Activity: Sufficiently Active   Days of Exercise per Week: 5 days   Minutes of Exercise per Session: 50 min  Social Connections: Unknown   Frequency of Communication with Friends and Family: More than three times a week   Frequency of Social Gatherings with Friends and Family: More than three times a week   Attends Religious Services: Never   Printmaker: Not on file   Attends Archivist Meetings: Not on file   Marital Status: Not on file  Stress: No Stress Concern Present   Feeling of Stress : Not at all  Tobacco Use: High Risk   Smoking Tobacco Use: Every Day   Smokeless Tobacco Use: Never   Passive Exposure: Not on file  Transportation Needs: No Transportation Needs   Lack of Transportation (Medical): No   Lack of Transportation (Non-Medical): No    CCM Care Plan  Allergies  Allergen Reactions   Aspirin    Erythromycin     Allergic to Mycins.   Hydrocodone-Acetaminophen Itching    GI Upset   Sulfa Antibiotics     Throat closes up    Medications Reviewed Today     Reviewed by Germaine Pomfret, [facility] (Pharmacist) on 04/17/21 at 1213  Med List Status: <None>   Medication Order Taking? Sig  Documenting Provider Last Dose Status Informant  albuterol (PROVENTIL HFA;VENTOLIN HFA) 108 (90 Base) MCG/ACT inhaler [id number]  Inhale 2 puffs into the lungs every 6 (six) hours as needed for wheezing or shortness of breath.  Patient not taking: Reported on 01/02/2021   Birdie Sons, MD  Active            Med Note Michaelle Birks, [facility]   Tue Jan 02, 2021 10:42 AM) Hasn't needed in some time  cetirizine (ZYRTEC) 10 MG tablet [id number]  Take 1 tablet (10 mg total) by mouth daily. Tally Joe T, FNP  Active   Cholecalciferol (VITAMIN D-3) 125 MCG (5000 UT) TABS [id number] Yes Take 1 tablet by mouth daily. [provider] Taking Active   escitalopram (LEXAPRO) 20 MG tablet [id number]  Take 1 tablet (20 mg total) by mouth daily. Gwyneth Sprout, FNP  Active   meloxicam (MOBIC) 15 MG tablet [id number]  Take 1 tablet (15 mg total) by mouth daily as needed for pain. Gwyneth Sprout, FNP  Active   metoCLOPramide (REGLAN) 10 MG tablet [id number]  Take 1 tablet (10 mg total) by mouth 3 (three) times daily as needed for nausea. Gwyneth Sprout, FNP  Active   Multiple Vitamin (MULTIVITAMIN) tablet [id number]  Take 1 tablet by mouth daily. [provider]  Active   omeprazole (PRILOSEC) 40 MG capsule [id number]  TAKE 1 CAPSULE EVERY DAY Gwyneth Sprout, FNP  Active   Probiotic Product (PROBIOTIC ADVANCED PO) [id number]  Take 1 capsule by mouth. [provider]  Active    Patient not taking:   Discontinued 04/17/21 1213 (Patient Preference)   simvastatin (ZOCOR) 20 MG tablet [id number]  Take 1 tablet (20 mg total) by mouth at bedtime. Gwyneth Sprout, FNP  Active   tiZANidine (ZANAFLEX) 4 MG tablet [id number]  Take 1 tablet (4 mg total) by mouth every 4 (four) hours as needed (headache).  Patient taking differently: Take 4 mg by mouth every 4 (four) hours as needed (migraine).   Gwyneth Sprout, FNP  Active   traZODone (DESYREL) 50 MG  tablet [id number]  Take 1 tablet (50 mg total) by mouth at  bedtime as needed for sleep. TAKE 1 TABLET BY MOUTH AT BEDTIME AS NEEDED FOR SLEEP. Gwyneth Sprout, FNP  Active   vitamin B-12 (CYANOCOBALAMIN) 1000 MCG tablet [id number]  Take 1,000 mcg by mouth daily. [provider]  Active             Patient Active Problem List   Diagnosis Date Noted   Osteopenia 01/02/2021   Annual physical exam 11/21/2020   Flu vaccine need 11/21/2020   Anxiety and depression 11/21/2020   Chronic left shoulder pain 11/21/2020   Motion sickness 11/21/2020   Encounter for tobacco use cessation counseling 11/21/2020   Nonintractable headache 10/05/2020   Personal history of colonic polyps    Hypercholesteremia 03/10/2015   Current tobacco use 03/19/2002    Immunization History  Administered Date(s) Administered   Fluad Quad(high Dose 65+) 11/21/2020   Influenza, High Dose Seasonal PF 12/16/2017   PFIZER(Purple Top)SARS-COV-2 Vaccination 06/02/2019, 06/23/2019   Pneumococcal Conjugate-13 12/16/2017   Pneumococcal Polysaccharide-23 07/08/2019   Td 09/15/2003   Tdap 07/18/2016    Conditions to be addressed/monitored:  Hyperlipidemia, GERD, Depression, Anxiety, Osteopenia, Tobacco use, Allergic Rhinitis, and Chronic Pain, Chronic Migraines  Care Plan : General Pharmacy (Adult)  Updates made by Germaine Pomfret, RPH since 04/17/2021 12:00 AM     Problem: Hyperlipidemia, GERD, Depression, Anxiety, Osteopenia, Tobacco use, Allergic Rhinitis, and Chronic Pain, Chronic Migraines   Priority: High     Long-Range Goal: Patient-Specific Goal   Start Date: 01/02/2021  Expected End Date: 01/02/2022  This Visit's Progress: On track  Recent Progress: On track  Priority: High  Note:   Current Barriers:  Unable to achieve control of Acid Reflux   Suboptimal therapeutic regimen for Osteopenia  Pharmacist Clinical Goal(s):  Patient will achieve control of Acid Reflux as evidenced by symptom remission through collaboration with PharmD and provider.    Interventions: 1:1 collaboration with Gwyneth Sprout, FNP regarding development and update of comprehensive plan of care as evidenced by provider attestation and co-signature Inter-disciplinary care team collaboration (see longitudinal plan of care) Comprehensive medication review performed; medication list updated in electronic medical record  Hyperlipidemia: (LDL goal < 100) -Not ideally controlled, given elevated triglycerides (patient was not fasting)  -Current treatment: Simvastatin 20 mg nightly -Medications previously tried: None  -Current dietary patterns: Breakfast is coffee + cream + oatmeal. Lunch: Veggie plate. -Current exercise habits: 3-4 times weekly at the gym, cardio/strength training -Recommended to continue current medication  Depression/Anxiety (Goal: Achieve symptom remission) -Controlled -Current treatment: Escitalopram 20 mg daily (Oct 2016)  -Medications previously tried/failed: Wellbutrin SR, Citalopram, diazepam, hydroxyzine, lorazepam. Paroxetine,  -PHQ9: 0 -GAD7: 3 -Lost two family members in past few months.  -Reports significant improvement in symptoms since starting escitalopram.  -Recommended to continue current medication  Insomnia (Goal: Improve sleep quality) -Controlled -Current treatment  Trazodone 50 mg nightly as needed (uses 3-4 times weekly) -Medications previously tried: NA -Stresses prevent her from falling asleep  -Recommended to continue current medication  Tobacco use (Goal Quit Smoking) -Uncontrolled, but improving  -Previous quit attempts: Wellbutrin SR (Nausea)  -Current treatment  Two cigarettes daily  -Avoid smoking by keeping busy -Patient smokes After 30 minutes of waking -Patient triggers include: stress and finishing a meal -Patient has significantly cut down on cigarette use, but is not sure she is ready to quit completely within the next 30 days. She prefers to take things "one day  at a time."  -Hesitant to try  smoking cessation products due to amount of nicotine in those products.  -Recommended to continue current medication  Chronic Pain (Goal: Minimize pain symptoms) -Controlled -Current treatment  Meloxicam 15 mg daily as needed  -Medications previously tried: NA  -Recommended to continue current medication  History of Migraines (Goal: Prevent migraines) -Controlled -Current treatment  Tizanidine  Metoclopramide  Meloxicam  -Medications previously tried: NA  -Recommended to continue current medication  Acid Reflux (Goal: Achieve symptom relief) -Uncontrolled -Current treatment  Omeprazole 40 mg daily  -Medications previously tried: NA  -Continue current medications  Osteopenia (Goal Prevent Fractures) -Controlled -Patient is not a candidate for pharmacologic treatment -Current treatment  Vitamin D 5000 units daily  -Medications previously tried: Calcium Carbonate  -Reports 2-3 servings of dietary calcium daily  -Continue current medications  COPD (Goal: control symptoms and prevent exacerbations) -Controlled -Current treatment  Albuterol HFA 2 puffs every 6 hours as needed -Medications previously tried: NA  -Some instances of wheezing due to viral cold.  -Recommended to continue current medication   Patient Goals/Self-Care Activities Patient will:  - target a minimum of 150 minutes of moderate intensity exercise weekly  Follow Up Plan: Telephone follow up appointment with care management team member scheduled for:  10/09/2021 at 11:00 AM     Medication Assistance: None required.  Patient affirms current coverage meets needs.  Compliance/Adherence/Medication fill history: Care Gaps: Zoster Vaccines COVID-19 Vaccine Booster 3  Star-Rating Drugs: Simvastatin 20 mg last filled on 02/16/2021 for a 90-Day Supply with [facility]  Patient's preferred pharmacy is:  [facility] [place], [place] [address] Phone: [phone] Fax: [phone]  [facility] [place] [place] [address] Phone: [phone] Fax: [phone]  Uses pill box? Yes Pt endorses 100% compliance  We discussed: Current pharmacy is preferred with insurance plan and patient is satisfied with pharmacy services Patient decided to: Continue current medication management strategy  Care Plan and Follow Up Patient Decision:  Patient agrees to Care Plan and Follow-up.  Plan: Telephone follow up appointment with care management team member scheduled for:  10/09/2021 at 11:00 AM  Junius Argyle, PharmD, Para [place], [place] [phone]

## 2021-04-17 NOTE — Patient Instructions (Addendum)
Visit Information It was great speaking with you today!  Please let me know if you have any questions about our visit.   Goals Addressed             This Visit's Progress    Manage My Medicine   On track    Timeframe:  Long-Range Goal Priority:  High Start Date: 01/02/2021                            Expected End Date: 01/02/2022                      Follow Up within 90 days   - call for medicine refill 2 or 3 days before it runs out - keep a list of all the medicines I take; vitamins and herbals too - use a pillbox to sort medicine    Why is this important?   These steps will help you keep on track with your medicines.   Notes:      Quit Smoking   Not on track    Recommend to continue efforts to reduce smoking habits until no longer smoking.         Patient Care Plan: General Pharmacy (Adult)     Problem Identified: Hyperlipidemia, GERD, Depression, Anxiety, Osteopenia, Tobacco use, Allergic Rhinitis, and Chronic Pain, Chronic Migraines   Priority: High     Long-Range Goal: Patient-Specific Goal   Start Date: 01/02/2021  Expected End Date: 01/02/2022  This Visit's Progress: On track  Recent Progress: On track  Priority: High  Note:   Current Barriers:  Unable to achieve control of Acid Reflux   Suboptimal therapeutic regimen for Osteopenia  Pharmacist Clinical Goal(s):  Patient will achieve control of Acid Reflux as evidenced by symptom remission through collaboration with PharmD and provider.   Interventions: 1:1 collaboration with [name], FNP regarding development and update of comprehensive plan of care as evidenced by provider attestation and co-signature Inter-disciplinary care team collaboration (see longitudinal plan of care) Comprehensive medication review performed; medication list updated in electronic medical record  Hyperlipidemia: (LDL goal < 100) -Not ideally controlled, given elevated triglycerides (patient was not fasting)  -Current  treatment: Simvastatin 20 mg nightly -Medications previously tried: None  -Current dietary patterns: Breakfast is coffee + cream + oatmeal. Lunch: Veggie plate. -Current exercise habits: 3-4 times weekly at the gym, cardio/strength training -Recommended to continue current medication  Depression/Anxiety (Goal: Achieve symptom remission) -Controlled -Current treatment: Escitalopram 20 mg daily (Oct 2016)  -Medications previously tried/failed: Wellbutrin SR, Citalopram, diazepam, hydroxyzine, lorazepam. Paroxetine,  -PHQ9: 0 -GAD7: 3 -Lost two family members in past few months.  -Reports significant improvement in symptoms since starting escitalopram.  -Recommended to continue current medication  Insomnia (Goal: Improve sleep quality) -Controlled -Current treatment  Trazodone 50 mg nightly as needed (uses 3-4 times weekly) -Medications previously tried: NA -Stresses prevent her from falling asleep  -Recommended to continue current medication  Tobacco use (Goal Quit Smoking) -Uncontrolled, but improving  -Previous quit attempts: Wellbutrin SR (Nausea)  -Current treatment  Two cigarettes daily  -Avoid smoking by keeping busy -Patient smokes After 30 minutes of waking -Patient triggers include: stress and finishing a meal -Patient has significantly cut down on cigarette use, but is not sure she is ready to quit completely within the next 30 days. She prefers to take things "one day at a time."  -Hesitant to try smoking cessation products due  to amount of nicotine in those products.  -Recommended to continue current medication  Chronic Pain (Goal: Minimize pain symptoms) -Controlled -Current treatment  Meloxicam 15 mg daily as needed  -Medications previously tried: NA  -Recommended to continue current medication  History of Migraines (Goal: Prevent migraines) -Controlled -Current treatment  Tizanidine  Metoclopramide  Meloxicam  -Medications previously tried: NA   -Recommended to continue current medication  Acid Reflux (Goal: Achieve symptom relief) -Uncontrolled -Current treatment  Omeprazole 40 mg daily  -Medications previously tried: NA  -Continue current medications  Osteopenia (Goal Prevent Fractures) -Controlled -Patient is not a candidate for pharmacologic treatment -Current treatment  Vitamin D 5000 units daily  -Medications previously tried: Calcium Carbonate  -Reports 2-3 servings of dietary calcium daily  -Continue current medications  COPD (Goal: control symptoms and prevent exacerbations) -Controlled -Current treatment  Albuterol HFA 2 puffs every 6 hours as needed -Medications previously tried: NA  -Some instances of wheezing due to viral cold.  -Recommended to continue current medication   Patient Goals/Self-Care Activities Patient will:  - target a minimum of 150 minutes of moderate intensity exercise weekly  Follow Up Plan: Telephone follow up appointment with care management team member scheduled for:  10/09/2021 at 11:00 AM    Patient agreed to services and verbal consent obtained.   Patient verbalizes understanding of instructions and care plan provided today and agrees to view in MyChart. Active MyChart status confirmed with patient.    Angelena Sole, PharmD, Patsy [place], CPP  Clinical Pharmacist Practitioner  [facility] [phone]

## 2021-04-27 ENCOUNTER — Telehealth: Payer: Self-pay

## 2021-04-27 ENCOUNTER — Telehealth: Payer: Medicare HMO

## 2021-04-27 NOTE — Telephone Encounter (Signed)
°  Care Management   Follow Up Note   04/27/2021 Name: Melissa Buck MRN: [id number] DOB: 10-09-1950   Primary Care Provider: [name], FNP Reason for referral : Chronic Care Management   An unsuccessful telephone outreach was attempted today. The patient was referred to the case management team for assistance with care management and care coordination.    Follow Up Plan:  A HIPAA compliant voice message was left today requesting a return call.   [facility] Care Management [facility] [phone]

## 2021-05-01 DIAGNOSIS — E7800 Pure hypercholesterolemia, unspecified: Secondary | ICD-10-CM | POA: Diagnosis not present

## 2021-05-21 DIAGNOSIS — H2513 Age-related nuclear cataract, bilateral: Secondary | ICD-10-CM | POA: Diagnosis not present

## 2021-05-21 DIAGNOSIS — H4423 Degenerative myopia, bilateral: Secondary | ICD-10-CM | POA: Diagnosis not present

## 2021-05-21 DIAGNOSIS — H25013 Cortical age-related cataract, bilateral: Secondary | ICD-10-CM | POA: Diagnosis not present

## 2021-06-14 DIAGNOSIS — M7542 Impingement syndrome of left shoulder: Secondary | ICD-10-CM | POA: Diagnosis not present

## 2021-06-14 DIAGNOSIS — M5412 Radiculopathy, cervical region: Secondary | ICD-10-CM | POA: Diagnosis not present

## 2021-06-28 DIAGNOSIS — M7542 Impingement syndrome of left shoulder: Secondary | ICD-10-CM | POA: Diagnosis not present

## 2021-08-01 ENCOUNTER — Ambulatory Visit (INDEPENDENT_AMBULATORY_CARE_PROVIDER_SITE_OTHER): Payer: Medicare HMO

## 2021-08-01 VITALS — Wt 145.0 lb

## 2021-08-01 DIAGNOSIS — Z0000 Encounter for general adult medical examination without abnormal findings: Secondary | ICD-10-CM | POA: Diagnosis not present

## 2021-08-01 DIAGNOSIS — Z1231 Encounter for screening mammogram for malignant neoplasm of breast: Secondary | ICD-10-CM

## 2021-08-01 NOTE — Progress Notes (Signed)
Virtual Visit via Telephone Note  I connected with  [name] on 08/01/21 at  9:15 AM EDT by telephone and verified that I am speaking with the correct person using two identifiers.  Location: Patient: home Provider: [facility] Persons participating in the virtual visit: [place]   I discussed the limitations, risks, security and privacy concerns of performing an evaluation and management service by telephone and the availability of in person appointments. The patient expressed understanding and agreed to proceed.  Interactive audio and video telecommunications were attempted between this nurse and patient, however failed, due to patient having technical difficulties OR patient did not have access to video capability.  We continued and completed visit with audio only.  Some vital signs may be absent or patient reported.   Dionisio David, LPN  Subjective:   JANYIAH SHURE is a 71 y.o. female who presents for Medicare Annual (Subsequent) preventive examination.  Review of Systems           Objective:    There were no vitals filed for this visit. There is no height or weight on file to calculate BMI.     10/26/2020   11:55 AM 02/16/2020    1:42 PM 11/30/2019    5:06 PM 01/19/2019    3:34 PM 12/11/2018    8:50 AM 12/16/2017   10:06 AM 05/26/2016    4:32 PM  Advanced Directives  Does Patient Have a Medical Advance Directive? No No No No No No No  Type of Social research officer, government;Living will       Copy of [place] in Chart?  No - copy requested       Would patient like information on creating a medical advance directive? No - Patient declined No - Patient declined  No - Patient declined Yes (MAU/Ambulatory/Procedural Areas - Information given)  Yes (ED - Information included in AVS)    Current Medications (verified) Outpatient Encounter Medications as of 08/01/2021  Medication Sig   albuterol (PROVENTIL  HFA;VENTOLIN HFA) 108 (90 Base) MCG/ACT inhaler Inhale 2 puffs into the lungs every 6 (six) hours as needed for wheezing or shortness of breath. (Patient not taking: Reported on 01/02/2021)   cetirizine (ZYRTEC) 10 MG tablet Take 1 tablet (10 mg total) by mouth daily.   Cholecalciferol (VITAMIN D3) 125 MCG (5000 UT) CHEW Chew 5,000 Units by mouth daily.   escitalopram (LEXAPRO) 20 MG tablet Take 1 tablet (20 mg total) by mouth daily.   meloxicam (MOBIC) 15 MG tablet Take 1 tablet (15 mg total) by mouth daily as needed for pain.   methocarbamol (ROBAXIN) 500 MG tablet methocarbamol 500 mg tablet  TAKE 1 TABLET BY MOUTH TWICE DAILY   methocarbamol (ROBAXIN) 500 MG tablet    metoCLOPramide (REGLAN) 10 MG tablet Take 1 tablet (10 mg total) by mouth 3 (three) times daily as needed for nausea.   Multiple Vitamin (MULTIVITAMIN) tablet Take 1 tablet by mouth daily.   omeprazole (PRILOSEC) 40 MG capsule TAKE 1 CAPSULE EVERY DAY   Probiotic Product (PROBIOTIC ADVANCED PO) Take 1 capsule by mouth.   SHINGRIX injection    simvastatin (ZOCOR) 20 MG tablet Take 1 tablet (20 mg total) by mouth at bedtime.   tiZANidine (ZANAFLEX) 4 MG tablet Take 1 tablet (4 mg total) by mouth every 4 (four) hours as needed (headache). (Patient taking differently: Take 4 mg by mouth every 4 (four) hours as needed (migraine).)   traZODone (DESYREL)  50 MG tablet Take 1 tablet (50 mg total) by mouth at bedtime as needed for sleep. TAKE 1 TABLET BY MOUTH AT BEDTIME AS NEEDED FOR SLEEP.   vitamin B-12 (CYANOCOBALAMIN) 1000 MCG tablet Take 1,000 mcg by mouth daily.   No facility-administered encounter medications on file as of 08/01/2021.    Allergies (verified) Aspirin, Erythromycin, Hydrocodone-acetaminophen, and Sulfa antibiotics   History: Past Medical History:  Diagnosis Date   Arthritis    knees, ankles,    Asthma    Colitis    COPD (chronic obstructive pulmonary disease) (HCC)    GERD (gastroesophageal reflux disease)     Headache    history of migraines   Hyperlipidemia    Wears contact lenses    Wears dentures    upper full plate   Past Surgical History:  Procedure Laterality Date   ABDOMINAL HYSTERECTOMY     CERVICAL FUSION     C5 - C6 fusion   COLONOSCOPY     COLONOSCOPY WITH PROPOFOL N/A 12/11/2018   Procedure: COLONOSCOPY WITH PROPOFOL;  Surgeon: [name], MD;  Location: [facility];  Service: Endoscopy;  Laterality: N/A;   TOENAIL EXCISION     Family History  Problem Relation Age of Onset   Healthy Sister    Heart disease Brother    Breast cancer Maternal Aunt    Social History   Socioeconomic History   Marital status: Divorced    Spouse name: Not on file   Number of children: 3   Years of education: Not on file   Highest education level: Some college, no degree  Occupational History   Occupation: retired  Tobacco Use   Smoking status: Every Day    Packs/day: 0.25    Years: 50.00    Pack years: 12.50    Types: Cigarettes   Smokeless tobacco: Never   Tobacco comments:    2 cigarettes daily.  Vaping Use   Vaping Use: Never used  Substance and Sexual Activity   Alcohol use: Yes    Alcohol/week: 7.0 - 9.0 standard drinks    Types: 7 - 9 Glasses of wine per week   Drug use: No   Sexual activity: Not on file  Other Topics Concern   Not on file  Social History Narrative   Not on file   Social Determinants of Health   Financial Resource Strain: Low Risk    Difficulty of Paying Living Expenses: Not hard at all  Food Insecurity: No Food Insecurity   Worried About Charity fundraiser in the Last Year: Never true   Ran Out of Food in the Last Year: Never true  Transportation Needs: No Transportation Needs   Lack of Transportation (Medical): No   Lack of Transportation (Non-Medical): No  Physical Activity: Sufficiently Active   Days of Exercise per Week: 5 days   Minutes of Exercise per Session: 50 min  Stress: No Stress Concern Present   Feeling of Stress  : Not at all  Social Connections: Unknown   Frequency of Communication with Friends and Family: More than three times a week   Frequency of Social Gatherings with Friends and Family: More than three times a week   Attends Religious Services: Never   Marine scientist or Organizations: Not on file   Attends Archivist Meetings: Not on file   Marital Status: Not on file    Tobacco Counseling Ready to quit: Not Answered Counseling given: Not Answered Tobacco comments: 2 cigarettes  daily.   Clinical Intake:  Pre-visit preparation completed: Yes  Pain : No/denies pain     Nutritional Risks: None Diabetes: No     Diabetic?no  Interpreter Needed?: No  Information entered by :: Kirke Shaggy, LPN   Activities of Daily Living    11/21/2020    2:49 PM  In your present state of health, do you have any difficulty performing the following activities:  Hearing? 0  Vision? 0  Difficulty concentrating or making decisions? 0  Walking or climbing stairs? 0  Dressing or bathing? 0  Doing errands, shopping? 0    Patient Care Team: Gwyneth Sprout, FNP as PCP - General (Family Medicine) Birdie Sons, MD as Referring Physician (Family Medicine) Ocie Doyne, [place] (Optometry) Edrick Kins, DPM as Consulting Physician (Podiatry) Earnestine Leys, MD (Orthopedic Surgery) Vern Claude, LCSW as Social Worker Michaelle Birks, Glean Salvo, [facility] (Pharmacist) Neldon Labella, RN as Case Manager  Indicate any recent Medical Services you may have received from other than [facility] providers in the past year (date may be approximate).     Assessment:   This is a routine wellness examination for Tarnisha.  Hearing/Vision screen No results found.  Dietary issues and exercise activities discussed:     Goals Addressed   None    Depression Screen    12/01/2020    2:55 PM 11/30/2020   11:14 AM 11/21/2020    2:48 PM 07/11/2020    1:45 PM 07/08/2019    2:56 PM 04/12/2019    2:52 PM  01/19/2019    3:38 PM  PHQ 2/9 Scores  PHQ - 2 Score 0 0 0 4 0 0 0  PHQ- 9 Score   0 17 2 3      Fall Risk    12/01/2020    3:30 PM 11/21/2020    2:48 PM 02/16/2020    1:42 PM 07/08/2019    2:56 PM 01/19/2019    3:35 PM  Fall Risk   Falls in the past year? 0 0 0 0 1  Number falls in past yr: 0 0 0 0 1  Injury with Fall?  0 0 0 0  Risk for fall due to :  No Fall Risks   Impaired mobility  Risk for fall due to: Comment     Due to knee pain. Pt to follow up with PCP about knee concerns.  Follow up Falls prevention discussed    Falls prevention discussed    FALL RISK PREVENTION PERTAINING TO THE HOME:  Any stairs in or around the home? No  If so, are there any without handrails? No  Home free of loose throw rugs in walkways, pet beds, electrical cords, etc? Yes  Adequate lighting in your home to reduce risk of falls? Yes   ASSISTIVE DEVICES UTILIZED TO PREVENT FALLS:  Life alert? No  Use of a cane, walker or w/c? No  Grab bars in the bathroom? Yes  Shower chair or bench in shower? No  Elevated toilet seat or a handicapped toilet? Yes     Cognitive Function: 0 points, 6CIT        01/19/2019    3:44 PM  6CIT Screen  What Year? 0 points  What month? 0 points  What time? 0 points  Count back from 20 0 points  Months in reverse 0 points  Repeat phrase 0 points  Total Score 0 points    Immunizations Immunization History  Administered Date(s) Administered   Fluad Quad(high Dose  65+) 11/21/2020   Influenza, High Dose Seasonal PF 12/16/2017   PFIZER(Purple Top)SARS-COV-2 Vaccination 06/02/2019, 06/23/2019   Pneumococcal Conjugate-13 12/16/2017   Pneumococcal Polysaccharide-23 07/08/2019   Td 09/15/2003   Tdap 07/18/2016    TDAP status: Up to date  Flu Vaccine status: Up to date  Pneumococcal vaccine status: Up to date  Covid-19 vaccine status: Completed vaccines  Qualifies for Shingles Vaccine? Yes   Zostavax completed No   Shingrix Completed?: No.     Education has been provided regarding the importance of this vaccine. Patient has been advised to call insurance company to determine out of pocket expense if they have not yet received this vaccine. Advised may also receive vaccine at local pharmacy or Health Dept. Verbalized acceptance and understanding.  Screening Tests Health Maintenance  Topic Date Due   Zoster Vaccines- Shingrix (1 of 2) Never done   COVID-19 Vaccine (3 - Booster for Pfizer series) 08/18/2019   INFLUENZA VACCINE  10/02/2021   MAMMOGRAM  03/28/2022   COLONOSCOPY (Pts 45-89yrs Insurance coverage will need to be confirmed)  12/11/2023   DEXA SCAN  02/08/2026   TETANUS/TDAP  07/19/2026   Pneumonia Vaccine 77+ Years old  Completed   Hepatitis C Screening  Completed   HPV VACCINES  Aged Out    Health Maintenance  Health Maintenance Due  Topic Date Due   Zoster Vaccines- Shingrix (1 of 2) Never done   COVID-19 Vaccine (3 - Booster for Pfizer series) 08/18/2019    Colorectal cancer screening: Type of screening: Colonoscopy. Completed 12/11/18. Repeat every 5 years  Mammogram status: Ordered today. Pt provided with contact info and advised to call to schedule appt.   Bone Density status: Completed 02/08/21. Results reflect: Bone density results: NORMAL. Repeat every 5 years.  Lung Cancer Screening: (Low Dose CT Chest recommended if Age 86-80 years, 30 pack-year currently smoking OR have quit w/in 15years.) does not qualify.     Additional Screening:  Hepatitis C Screening: does qualify; Completed 12/16/17  Vision Screening: Recommended annual ophthalmology exams for early detection of glaucoma and other disorders of the eye. Is the patient up to date with their annual eye exam?  Yes  Who is the provider or what is the name of the office in which the patient attends annual eye exams? Dr.Woodard If pt is not established with a provider, would they like to be referred to a provider to establish care? No .   Dental  Screening: Recommended annual dental exams for proper oral hygiene  Community Resource Referral / Chronic Care Management: CRR required this visit?  No   CCM required this visit?  No      Plan:     I have personally reviewed and noted the following in the patient's chart:   Medical and social history Use of alcohol, tobacco or illicit drugs  Current medications and supplements including opioid prescriptions.  Functional ability and status Nutritional status Physical activity Advanced directives List of other physicians Hospitalizations, surgeries, and ER visits in previous 12 months Vitals Screenings to include cognitive, depression, and falls Referrals and appointments  In addition, I have reviewed and discussed with patient certain preventive protocols, quality metrics, and best practice recommendations. A written personalized care plan for preventive services as well as general preventive health recommendations were provided to patient.     Dionisio David, LPN   [id number]   Nurse Notes: none

## 2021-08-01 NOTE — Patient Instructions (Signed)
Melissa Buck , Thank you for taking time to come for your Medicare Wellness Visit. I appreciate your ongoing commitment to your health goals. Please review the following plan we discussed and let me know if I can assist you in the future.   Screening recommendations/referrals: Colonoscopy: 12/11/18 Mammogram: referral sent Bone Density: 02/08/21 Recommended yearly ophthalmology/optometry visit for glaucoma screening and checkup Recommended yearly dental visit for hygiene and checkup  Vaccinations: Influenza vaccine: 11/21/20 Pneumococcal vaccine: 07/08/19 Tdap vaccine: 07/18/16 Shingles vaccine: Shingrix 05/09/21, needs 2nd shot   Covid-19:06/02/19, 06/23/19  Advanced directives: no  Conditions/risks identified: none  Next appointment: Follow up in one year for your annual wellness visit 08/05/22 @ 9:15 am by phone   Preventive Care 65 Years and Older, Female Preventive care refers to lifestyle choices and visits with your health care provider that can promote health and wellness. What does preventive care include? A yearly physical exam. This is also called an annual well check. Dental exams once or twice a year. Routine eye exams. Ask your health care provider how often you should have your eyes checked. Personal lifestyle choices, including: Daily care of your teeth and gums. Regular physical activity. Eating a healthy diet. Avoiding tobacco and drug use. Limiting alcohol use. Practicing safe sex. Taking low-dose aspirin every day. Taking vitamin and mineral supplements as recommended by your health care provider. What happens during an annual well check? The services and screenings done by your health care provider during your annual well check will depend on your age, overall health, lifestyle risk factors, and family history of disease. Counseling  Your health care provider may ask you questions about your: Alcohol use. Tobacco use. Drug use. Emotional well-being. Home and  relationship well-being. Sexual activity. Eating habits. History of falls. Memory and ability to understand (cognition). Work and work Astronomer. Reproductive health. Screening  You may have the following tests or measurements: Height, weight, and BMI. Blood pressure. Lipid and cholesterol levels. These may be checked every 5 years, or more frequently if you are over 55 years old. Skin check. Lung cancer screening. You may have this screening every year starting at age 1 if you have a 30-pack-year history of smoking and currently smoke or have quit within the past 15 years. Fecal occult blood test (FOBT) of the stool. You may have this test every year starting at age 72. Flexible sigmoidoscopy or colonoscopy. You may have a sigmoidoscopy every 5 years or a colonoscopy every 10 years starting at age 42. Hepatitis C blood test. Hepatitis B blood test. Sexually transmitted disease (STD) testing. Diabetes screening. This is done by checking your blood sugar (glucose) after you have not eaten for a while (fasting). You may have this done every 1-3 years. Bone density scan. This is done to screen for osteoporosis. You may have this done starting at age 11. Mammogram. This may be done every 1-2 years. Talk to your health care provider about how often you should have regular mammograms. Talk with your health care provider about your test results, treatment options, and if necessary, the need for more tests. Vaccines  Your health care provider may recommend certain vaccines, such as: Influenza vaccine. This is recommended every year. Tetanus, diphtheria, and acellular pertussis (Tdap, Td) vaccine. You may need a Td booster every 10 years. Zoster vaccine. You may need this after age 3. Pneumococcal 13-valent conjugate (PCV13) vaccine. One dose is recommended after age 80. Pneumococcal polysaccharide (PPSV23) vaccine. One dose is recommended after age 36. Talk  to your health care provider  about which screenings and vaccines you need and how often you need them. This information is not intended to replace advice given to you by your health care provider. Make sure you discuss any questions you have with your health care provider. Document Released: 03/17/2015 Document Revised: 11/08/2015 Document Reviewed: 12/20/2014 Elsevier Interactive Patient Education  2017 [place] Prevention in the Home Falls can cause injuries. They can happen to people of all ages. There are many things you can do to make your home safe and to help prevent falls. What can I do on the outside of my home? Regularly fix the edges of walkways and driveways and fix any cracks. Remove anything that might make you trip as you walk through a door, such as a raised step or threshold. Trim any bushes or trees on the path to your home. Use bright outdoor lighting. Clear any walking paths of anything that might make someone trip, such as rocks or tools. Regularly check to see if handrails are loose or broken. Make sure that both sides of any steps have handrails. Any raised decks and porches should have guardrails on the edges. Have any leaves, snow, or ice cleared regularly. Use sand or salt on walking paths during winter. Clean up any spills in your garage right away. This includes oil or grease spills. What can I do in the bathroom? Use night lights. Install grab bars by the toilet and in the tub and shower. Do not use towel bars as grab bars. Use non-skid mats or decals in the tub or shower. If you need to sit down in the shower, use a plastic, non-slip stool. Keep the floor dry. Clean up any water that spills on the floor as soon as it happens. Remove soap buildup in the tub or shower regularly. Attach bath mats securely with double-sided non-slip rug tape. Do not have throw rugs and other things on the floor that can make you trip. What can I do in the bedroom? Use night lights. Make sure  that you have a light by your bed that is easy to reach. Do not use any sheets or blankets that are too big for your bed. They should not hang down onto the floor. Have a firm chair that has side arms. You can use this for support while you get dressed. Do not have throw rugs and other things on the floor that can make you trip. What can I do in the kitchen? Clean up any spills right away. Avoid walking on wet floors. Keep items that you use a lot in easy-to-reach places. If you need to reach something above you, use a strong step stool that has a grab bar. Keep electrical cords out of the way. Do not use floor polish or wax that makes floors slippery. If you must use wax, use non-skid floor wax. Do not have throw rugs and other things on the floor that can make you trip. What can I do with my stairs? Do not leave any items on the stairs. Make sure that there are handrails on both sides of the stairs and use them. Fix handrails that are broken or loose. Make sure that handrails are as long as the stairways. Check any carpeting to make sure that it is firmly attached to the stairs. Fix any carpet that is loose or worn. Avoid having throw rugs at the top or bottom of the stairs. If you do have throw rugs,  attach them to the floor with carpet tape. Make sure that you have a light switch at the top of the stairs and the bottom of the stairs. If you do not have them, ask someone to add them for you. What else can I do to help prevent falls? Wear shoes that: Do not have high heels. Have rubber bottoms. Are comfortable and fit you well. Are closed at the toe. Do not wear sandals. If you use a stepladder: Make sure that it is fully opened. Do not climb a closed stepladder. Make sure that both sides of the stepladder are locked into place. Ask someone to hold it for you, if possible. Clearly mark and make sure that you can see: Any grab bars or handrails. First and last steps. Where the edge of  each step is. Use tools that help you move around (mobility aids) if they are needed. These include: Canes. Walkers. Scooters. Crutches. Turn on the lights when you go into a dark area. Replace any light bulbs as soon as they burn out. Set up your furniture so you have a clear path. Avoid moving your furniture around. If any of your floors are uneven, fix them. If there are any pets around you, be aware of where they are. Review your medicines with your doctor. Some medicines can make you feel dizzy. This can increase your chance of falling. Ask your doctor what other things that you can do to help prevent falls. This information is not intended to replace advice given to you by your health care provider. Make sure you discuss any questions you have with your health care provider. Document Released: 12/15/2008 Document Revised: 07/27/2015 Document Reviewed: 03/25/2014 Elsevier Interactive Patient Education  2017 [employer].

## 2021-09-17 DIAGNOSIS — H524 Presbyopia: Secondary | ICD-10-CM | POA: Diagnosis not present

## 2021-09-17 DIAGNOSIS — H25043 Posterior subcapsular polar age-related cataract, bilateral: Secondary | ICD-10-CM | POA: Diagnosis not present

## 2021-09-17 DIAGNOSIS — H2511 Age-related nuclear cataract, right eye: Secondary | ICD-10-CM | POA: Diagnosis not present

## 2021-09-17 DIAGNOSIS — H52213 Irregular astigmatism, bilateral: Secondary | ICD-10-CM | POA: Diagnosis not present

## 2021-09-17 DIAGNOSIS — H25013 Cortical age-related cataract, bilateral: Secondary | ICD-10-CM | POA: Diagnosis not present

## 2021-09-17 DIAGNOSIS — J449 Chronic obstructive pulmonary disease, unspecified: Secondary | ICD-10-CM | POA: Diagnosis not present

## 2021-09-17 DIAGNOSIS — H2513 Age-related nuclear cataract, bilateral: Secondary | ICD-10-CM | POA: Diagnosis not present

## 2021-09-17 DIAGNOSIS — H15833 Staphyloma posticum, bilateral: Secondary | ICD-10-CM | POA: Diagnosis not present

## 2021-09-17 DIAGNOSIS — H35433 Paving stone degeneration of retina, bilateral: Secondary | ICD-10-CM | POA: Diagnosis not present

## 2021-09-18 ENCOUNTER — Encounter (INDEPENDENT_AMBULATORY_CARE_PROVIDER_SITE_OTHER): Payer: Medicare HMO | Admitting: Ophthalmology

## 2021-09-18 DIAGNOSIS — H43813 Vitreous degeneration, bilateral: Secondary | ICD-10-CM | POA: Diagnosis not present

## 2021-09-18 DIAGNOSIS — H4423 Degenerative myopia, bilateral: Secondary | ICD-10-CM

## 2021-09-21 ENCOUNTER — Telehealth: Payer: Self-pay

## 2021-09-21 NOTE — Telephone Encounter (Signed)
  Care Management   Follow Up Note   09/21/2021 Name: Melissa Buck MRN: [id number] DOB: [birth year]   Primary Care Provider: [name], FNP Reason for referral : Chronic Care Management   An unsuccessful telephone outreach was attempted today. The patient was referred to the case management team for assistance with care management and care coordination.    Follow Up Plan:  A HIPAA compliant voice message was left today requesting a return call.   [facility] Care Management [phone]

## 2021-09-25 ENCOUNTER — Other Ambulatory Visit: Payer: Self-pay | Admitting: Family Medicine

## 2021-10-08 ENCOUNTER — Other Ambulatory Visit: Payer: Self-pay | Admitting: Family Medicine

## 2021-10-08 DIAGNOSIS — F5101 Primary insomnia: Secondary | ICD-10-CM

## 2021-10-09 ENCOUNTER — Telehealth: Payer: Medicare HMO

## 2021-10-09 ENCOUNTER — Ambulatory Visit: Payer: Self-pay

## 2021-10-09 DIAGNOSIS — H2511 Age-related nuclear cataract, right eye: Secondary | ICD-10-CM | POA: Diagnosis not present

## 2021-10-09 DIAGNOSIS — H25811 Combined forms of age-related cataract, right eye: Secondary | ICD-10-CM | POA: Diagnosis not present

## 2021-10-09 DIAGNOSIS — E7800 Pure hypercholesterolemia, unspecified: Secondary | ICD-10-CM

## 2021-10-09 NOTE — Chronic Care Management (AMB) (Signed)
   10/09/2021  Melissa Buck 03/15/1950 [id number]  Documentation encounter created to complete case transition. The care management team will continue to follow [name] for care coordination needs.   [facility] Care Management [phone]

## 2021-10-18 DIAGNOSIS — H2511 Age-related nuclear cataract, right eye: Secondary | ICD-10-CM | POA: Diagnosis not present

## 2021-10-29 ENCOUNTER — Other Ambulatory Visit: Payer: Self-pay | Admitting: Family Medicine

## 2021-10-29 DIAGNOSIS — E7800 Pure hypercholesterolemia, unspecified: Secondary | ICD-10-CM

## 2021-11-08 DIAGNOSIS — H25042 Posterior subcapsular polar age-related cataract, left eye: Secondary | ICD-10-CM | POA: Diagnosis not present

## 2021-11-08 DIAGNOSIS — H25012 Cortical age-related cataract, left eye: Secondary | ICD-10-CM | POA: Diagnosis not present

## 2021-11-08 DIAGNOSIS — H2512 Age-related nuclear cataract, left eye: Secondary | ICD-10-CM | POA: Diagnosis not present

## 2021-11-13 DIAGNOSIS — H25042 Posterior subcapsular polar age-related cataract, left eye: Secondary | ICD-10-CM | POA: Diagnosis not present

## 2021-11-13 DIAGNOSIS — H2512 Age-related nuclear cataract, left eye: Secondary | ICD-10-CM | POA: Diagnosis not present

## 2021-11-13 DIAGNOSIS — H25812 Combined forms of age-related cataract, left eye: Secondary | ICD-10-CM | POA: Diagnosis not present

## 2021-11-13 DIAGNOSIS — H25012 Cortical age-related cataract, left eye: Secondary | ICD-10-CM | POA: Diagnosis not present

## 2021-11-14 DIAGNOSIS — J449 Chronic obstructive pulmonary disease, unspecified: Secondary | ICD-10-CM | POA: Diagnosis not present

## 2021-11-20 DIAGNOSIS — H2512 Age-related nuclear cataract, left eye: Secondary | ICD-10-CM | POA: Diagnosis not present

## 2021-12-09 ENCOUNTER — Other Ambulatory Visit: Payer: Self-pay | Admitting: Family Medicine

## 2021-12-09 DIAGNOSIS — F419 Anxiety disorder, unspecified: Secondary | ICD-10-CM

## 2021-12-10 ENCOUNTER — Ambulatory Visit: Payer: Self-pay

## 2021-12-10 NOTE — Telephone Encounter (Signed)
  Chief Complaint: High BP Symptoms: headache (yesterday) BP yest 204/99 20?/101 today 164/99,  Frequency: yesterday Pertinent Negatives: Patient denies blurred vision, chest pain, difficulty breathing, headache, weakness, chest pain Disposition: [] ED /[] Urgent Care (no appt availability in office) / [x] Appointment(In office/virtual)/ []  [place] [facility]/ [] Home Care/ [] Refused Recommended Disposition /[] [place] [facility]/ []  Follow-up with PCP Additional Notes: needs to discuss restarting antidepressant Reason for Disposition  Systolic BP  >= 299 OR Diastolic >= 242  Answer Assessment - Initial Assessment Questions 1. BLOOD PRESSURE: "What is the blood pressure?" "Did you take at least two measurements 5 minutes apart?"     Yest: noon:204/99    this am: 20?/101 164/99 2. ONSET: "When did you take your blood pressure?"     yesterday 3. HOW: "How did you take your blood pressure?" (e.g., automatic home BP monitor, visiting nurse)     Automatic BP cuff 4. HISTORY: "Do you have a history of high blood pressure?"     no 5. MEDICINES: "Are you taking any medicines for blood pressure?" "Have you missed any doses recently?"     no 6. OTHER SYMPTOMS: "Do you have any symptoms?" (e.g., blurred vision, chest pain, difficulty breathing, headache, weakness)     Stress, yesterday headache, heart pounding 7. PREGNANCY: "Is there any chance you are pregnant?" "When was your last menstrual period?"     N/a  Protocols used: Blood Pressure - High-A-AH

## 2021-12-11 NOTE — Telephone Encounter (Signed)
Requested Prescriptions  Pending Prescriptions Disp Refills  . escitalopram (LEXAPRO) 20 MG tablet [Pharmacy Med Name: ESCITALOPRAM OXALATE 20 MG Tablet] 90 tablet 0    Sig: TAKE 1 TABLET EVERY DAY     Psychiatry:  Antidepressants - SSRI Passed - 12/09/2021 12:11 PM      Passed - Completed PHQ-2 or PHQ-9 in the last 360 days      Passed - Valid encounter within last 6 months    Recent Outpatient Visits          1 year ago Sciatica associated with disorder of lumbosacral spine   [facility] Gwyneth Sprout, FNP   1 year ago Anxiety and depression   [place], Vickki Muff, PA-C   1 year ago [place], [place], [place]   2 years ago Chronic left shoulder pain   [facility] Carles Collet M, [place]   2 years ago Bilateral foot pain   [facility] [place], Wendee Beavers, [place]      Future Appointments            In 3 days Gwyneth Sprout, [place], [place]

## 2021-12-12 ENCOUNTER — Other Ambulatory Visit: Payer: Self-pay | Admitting: Family Medicine

## 2021-12-14 ENCOUNTER — Encounter: Payer: Self-pay | Admitting: Family Medicine

## 2021-12-14 ENCOUNTER — Ambulatory Visit (INDEPENDENT_AMBULATORY_CARE_PROVIDER_SITE_OTHER): Payer: Medicare HMO | Admitting: Family Medicine

## 2021-12-14 VITALS — BP 135/84 | HR 64 | Resp 16 | Ht 61.0 in | Wt 130.0 lb

## 2021-12-14 DIAGNOSIS — Z720 Tobacco use: Secondary | ICD-10-CM

## 2021-12-14 DIAGNOSIS — F418 Other specified anxiety disorders: Secondary | ICD-10-CM | POA: Diagnosis not present

## 2021-12-14 DIAGNOSIS — F5101 Primary insomnia: Secondary | ICD-10-CM | POA: Insufficient documentation

## 2021-12-14 DIAGNOSIS — E7800 Pure hypercholesterolemia, unspecified: Secondary | ICD-10-CM

## 2021-12-14 DIAGNOSIS — Z23 Encounter for immunization: Secondary | ICD-10-CM | POA: Insufficient documentation

## 2021-12-14 DIAGNOSIS — R030 Elevated blood-pressure reading, without diagnosis of hypertension: Secondary | ICD-10-CM | POA: Diagnosis not present

## 2021-12-14 DIAGNOSIS — J449 Chronic obstructive pulmonary disease, unspecified: Secondary | ICD-10-CM | POA: Diagnosis not present

## 2021-12-14 DIAGNOSIS — M5387 Other specified dorsopathies, lumbosacral region: Secondary | ICD-10-CM | POA: Insufficient documentation

## 2021-12-14 MED ORDER — ESCITALOPRAM OXALATE 10 MG PO TABS: 10.0000 mg | ORAL_TABLET | Freq: Every day | ORAL | 3 refills | Status: DC

## 2021-12-14 NOTE — Assessment & Plan Note (Signed)
Chronic, 2-4 cigs/day Discussed elevated risk of heart attack and stroke with continued use  The 10-year ASCVD risk score (Arnett DK, et al., 2019) is: 14.2%   Values used to calculate the score:     Age: 71 years     Sex: Female     Is Non-Hispanic African American: No     Diabetic: No     Tobacco smoker: Yes     Systolic Blood Pressure: 616 mmHg     Is BP treated: No     HDL Cholesterol: 69 mg/dL     Total Cholesterol: 181 mg/dL

## 2021-12-14 NOTE — Assessment & Plan Note (Signed)
Chronic, stable Expresses concern regarding memory deficits with use of zocor 20 Repeat LP

## 2021-12-14 NOTE — Progress Notes (Signed)
Established patient visit   Patient: Melissa Buck   DOB: 02-26-1951   71 y.o. Female  MRN: [id number] Visit Date: 12/14/2021  Today's healthcare provider: [name], FNP  Introduced to nurse practitioner role and practice setting.  All questions answered.  Discussed provider/patient relationship and expectations.  I,Tiffany J Bragg,acting as a scribe for [name], FNP.,have documented all relevant documentation on the behalf of [name], FNP,as directed by  [name], FNP while in the presence of [name], FNP.   Chief Complaint  Patient presents with   Depression   Anxiety   Hypertension    Patient states she has never been diagnosed with hypertension but has had cataract surgery and was not able to do her regular routines and her BP has been high lately.    Subjective    HPI HPI     Hypertension    Additional comments: Patient states she has never been diagnosed with hypertension but has had cataract surgery and was not able to do her regular routines and her BP has been high lately.       Last edited by Marlana Salvage, CMA on 12/14/2021  1:23 PM.      Anxiety/depression, Follow-up  She was last seen for anxiety 1 years ago. Changes made at last visit include no changes at last visit, but states she take half a dose as prescribed.   She reports excellent compliance with treatment. She reports excellent tolerance of treatment. She is not having side effects.   She feels her anxiety is moderate and Improved since last visit.  Symptoms: No chest pain Yes difficulty concentrating  No dizziness No fatigue  Yes feelings of losing control No insomnia  Yes irritable No palpitations  No panic attacks No racing thoughts  No shortness of breath No sweating  No tremors/shakes    GAD-7 Results    04/12/2019    2:55 PM 01/19/2019    4:06 PM 10/06/2018   10:15 AM  GAD-7 Generalized Anxiety Disorder Screening Tool  1. Feeling Nervous,  Anxious, or on Edge 0 0 2  2. Not Being Able to Stop or Control Worrying 1 0 2  3. Worrying Too Much About Different Things 1 0 3  4. Trouble Relaxing 0 0 2  5. Being So Restless it's Hard To Sit Still 1 1 2   6. Becoming Easily Annoyed or Irritable 0 0 3  7. Feeling Afraid As If Something Awful Might Happen 0 0 1  Total GAD-7 Score 3 1 15   Difficulty At Work, Home, or Getting  Along With Others? Not difficult at all Not difficult at all Very difficult    PHQ-9 Scores    12/14/2021    1:29 PM 08/01/2021    9:18 AM 12/01/2020    2:55 PM  PHQ9 SCORE ONLY  PHQ-9 Total Score 3 0 0   Lipid/Cholesterol, Follow-up  Last lipid panel Other pertinent labs  Lab Results  Component Value Date   CHOL 181 11/22/2020   HDL 69 11/22/2020   LDLCALC 78 11/22/2020   TRIG 207 (H) 11/22/2020   CHOLHDL 2.6 11/22/2020   Lab Results  Component Value Date   ALT 14 11/22/2020   AST 17 11/22/2020   PLT 205 11/22/2020   TSH 1.650 11/22/2020     She was last seen for this 1 years ago.  Management since that visit includes no changes.  She reports excellent compliance with  treatment. She is not having side effects. Does state that she has memory concerns  Symptoms: No chest pain No chest pressure/discomfort  No dyspnea No lower extremity edema  No numbness or tingling of extremity No orthopnea  No palpitations No paroxysmal nocturnal dyspnea  No speech difficulty No syncope   Current diet: well balanced Current exercise: aerobics and walking  The 10-year ASCVD risk score (Arnett DK, et al., 2019) is: 14.2%  ---------------------------------------------------------------------------------------------------  ---------------------------------------------------------------------------------------------------   Medications: Outpatient Medications Prior to Visit  Medication Sig   albuterol (PROVENTIL HFA;VENTOLIN HFA) 108 (90 Base) MCG/ACT inhaler Inhale 2 puffs into the lungs every 6 (six)  hours as needed for wheezing or shortness of breath.   cetirizine (ZYRTEC) 10 MG tablet Take 1 tablet (10 mg total) by mouth daily.   Cholecalciferol (VITAMIN D3) 125 MCG (5000 UT) CHEW Chew 5,000 Units by mouth daily.   meloxicam (MOBIC) 15 MG tablet Take 1 tablet (15 mg total) by mouth daily as needed for pain.   methocarbamol (ROBAXIN) 500 MG tablet    metoCLOPramide (REGLAN) 10 MG tablet Take 1 tablet (10 mg total) by mouth 3 (three) times daily as needed for nausea.   Multiple Vitamin (MULTIVITAMIN) tablet Take 1 tablet by mouth daily.   omeprazole (PRILOSEC) 40 MG capsule TAKE 1 CAPSULE EVERY DAY   Probiotic Product (PROBIOTIC ADVANCED PO) Take 1 capsule by mouth.   SHINGRIX injection    simvastatin (ZOCOR) 20 MG tablet Take 1 tablet (20 mg total) by mouth at bedtime.   tiZANidine (ZANAFLEX) 4 MG tablet Take 1 tablet (4 mg total) by mouth every 4 (four) hours as needed (headache). (Patient taking differently: Take 4 mg by mouth every 4 (four) hours as needed (migraine).)   traZODone (DESYREL) 50 MG tablet TAKE 1 TABLET AT BEDTIME AS NEEDED FOR SLEEP   vitamin B-12 (CYANOCOBALAMIN) 1000 MCG tablet Take 1,000 mcg by mouth daily.   [DISCONTINUED] escitalopram (LEXAPRO) 20 MG tablet TAKE 1 TABLET EVERY DAY   No facility-administered medications prior to visit.    Review of Systems    Objective    BP 135/84 (BP Location: Left Arm, Patient Position: Sitting, Cuff Size: Normal)   Pulse 64   Resp 16   Ht 5\' 1"  (1.549 m)   Wt 130 lb (59 kg)   SpO2 99%   BMI 24.56 kg/m   Physical Exam Vitals and nursing note reviewed.  Constitutional:      General: She is not in acute distress.    Appearance: Normal appearance. She is normal weight. She is not ill-appearing, toxic-appearing or diaphoretic.  HENT:     Head: Normocephalic and atraumatic.  Cardiovascular:     Rate and Rhythm: Normal rate and regular rhythm.     Pulses: Normal pulses.     Heart sounds: Normal heart sounds. No murmur  heard.    No friction rub. No gallop.  Pulmonary:     Effort: Pulmonary effort is normal. No respiratory distress.     Breath sounds: Normal breath sounds. No stridor. No wheezing, rhonchi or rales.  Chest:     Chest wall: No tenderness.  Musculoskeletal:        General: No swelling, tenderness, deformity or signs of injury. Normal range of motion.     Right lower leg: No edema.     Left lower leg: No edema.  Skin:    General: Skin is warm and dry.     Capillary Refill: Capillary refill takes less than 2  seconds.     Coloration: Skin is not jaundiced or pale.     Findings: No bruising, erythema, lesion or rash.  Neurological:     General: No focal deficit present.     Mental Status: She is alert and oriented to person, place, and time. Mental status is at baseline.     Cranial Nerves: No cranial nerve deficit.     Sensory: No sensory deficit.     Motor: No weakness.     Coordination: Coordination normal.  Psychiatric:        Mood and Affect: Mood normal.        Behavior: Behavior normal.        Thought Content: Thought content normal.        Judgment: Judgment normal.     No results found for any visits on 12/14/21.  Assessment & Plan     Problem List Items Addressed This Visit       Cardiovascular and Mediastinum   Elevated blood pressure, situational    Acute, variable Reports symptoms of elevated BP were noted following cataract surgeries when she couldn't do her normal ADLs      Relevant Orders   CBC   TSH   Basic Metabolic Panel (BMET)     Nervous and Auditory   Sciatica associated with disorder of lumbosacral spine    Chronic, stable Denies recent flares       Relevant Medications   escitalopram (LEXAPRO) 10 MG tablet     Other   Current tobacco use    Chronic, 2-4 cigs/day Discussed elevated risk of heart attack and stroke with continued use  The 10-year ASCVD risk score (Arnett DK, et al., 2019) is: 14.2%   Values used to calculate the score:      Age: 43 years     Sex: Female     Is Non-Hispanic African American: No     Diabetic: No     Tobacco smoker: Yes     Systolic Blood Pressure: 852 mmHg     Is BP treated: No     HDL Cholesterol: 69 mg/dL     Total Cholesterol: 181 mg/dL       Depression with anxiety - Primary    Chronic, improved Has reduced lexapro to 10 mg New Rx reflects this change      Relevant Medications   escitalopram (LEXAPRO) 10 MG tablet   Other Relevant Orders   CBC   Basic Metabolic Panel (BMET)   Hypercholesteremia    Chronic, stable Expresses concern regarding memory deficits with use of zocor 20 Repeat LP      Relevant Orders   Lipid panel   Need for influenza vaccination    Consented; VIS made available; no immediate side effects following administration; plan to repeat annually        Relevant Orders   Flu Vaccine QUAD High Dose(Fluad) (Completed)   Primary insomnia    Chronic, stable Use of PRN trazodone 50 to assist Recommend thyroid screening to assist with treatment given hx of anxiety and depression       Relevant Orders   TSH     Return in about 6 months (around 06/15/2022) for annual examination.      Vonna Kotyk, FNP, have reviewed all documentation for this visit. The documentation on 12/14/21 for the exam, diagnosis, procedures, and orders are all accurate and complete.    Gwyneth Sprout, [place] [phone] (phone) [phone] (fax)  [facility]  

## 2021-12-14 NOTE — Assessment & Plan Note (Signed)
Acute, variable Reports symptoms of elevated BP were noted following cataract surgeries when she couldn't do her normal ADLs

## 2021-12-14 NOTE — Assessment & Plan Note (Signed)
Chronic, stable Denies recent flares

## 2021-12-14 NOTE — Assessment & Plan Note (Addendum)
Chronic, stable Use of PRN trazodone 50 to assist Recommend thyroid screening to assist with treatment given hx of anxiety and depression

## 2021-12-14 NOTE — Assessment & Plan Note (Signed)
Chronic, improved Has reduced lexapro to 10 mg New Rx reflects this change

## 2021-12-14 NOTE — Assessment & Plan Note (Signed)
Consented; VIS made available; no immediate side effects following administration; plan to repeat annually   

## 2021-12-15 NOTE — Progress Notes (Signed)
All labs that have returned are normal and stable; triglycerides are much improved.  Gwyneth Sprout, [place] [address] [place], [place] [address] [phone] (phone) [phone] (fax) [place]

## 2021-12-16 LAB — CBC
Hematocrit: 37.7 % (ref 34.0–46.6)
Hemoglobin: 12.5 g/dL (ref 11.1–15.9)
MCH: 32.4 pg (ref 26.6–33.0)
MCHC: 33.2 g/dL (ref 31.5–35.7)
MCV: 98 fL — ABNORMAL HIGH (ref 79–97)
Platelets: 245 10*3/uL (ref 150–450)
RBC: 3.86 x10E6/uL (ref 3.77–5.28)
RDW: 12.8 % (ref 11.7–15.4)
WBC: 5.4 10*3/uL (ref 3.4–10.8)

## 2021-12-16 LAB — BASIC METABOLIC PANEL
BUN/Creatinine Ratio: 11 — ABNORMAL LOW (ref 12–28)
BUN: 7 mg/dL — ABNORMAL LOW (ref 8–27)
CO2: 23 mmol/L (ref 20–29)
Calcium: 9.2 mg/dL (ref 8.7–10.3)
Chloride: 102 mmol/L (ref 96–106)
Creatinine, Ser: 0.61 mg/dL (ref 0.57–1.00)
Glucose: 89 mg/dL (ref 70–99)
Potassium: 4.2 mmol/L (ref 3.5–5.2)
Sodium: 139 mmol/L (ref 134–144)
eGFR: 97 mL/min/{1.73_m2} (ref >59)

## 2021-12-16 LAB — LIPID PANEL
Chol/HDL Ratio: 2.1 ratio (ref 0.0–4.4)
Cholesterol, Total: 164 mg/dL (ref 100–199)
HDL: 78 mg/dL (ref >39)
LDL Chol Calc (NIH): 65 mg/dL (ref 0–99)
Triglycerides: 119 mg/dL (ref 0–149)
VLDL Cholesterol Cal: 21 mg/dL (ref 5–40)

## 2021-12-16 LAB — TSH: TSH: 1.54 u[IU]/mL (ref 0.450–4.500)

## 2022-02-06 DIAGNOSIS — M7542 Impingement syndrome of left shoulder: Secondary | ICD-10-CM | POA: Diagnosis not present

## 2022-02-13 DIAGNOSIS — H16143 Punctate keratitis, bilateral: Secondary | ICD-10-CM | POA: Diagnosis not present

## 2022-02-13 DIAGNOSIS — H04123 Dry eye syndrome of bilateral lacrimal glands: Secondary | ICD-10-CM | POA: Diagnosis not present

## 2022-03-08 DIAGNOSIS — H538 Other visual disturbances: Secondary | ICD-10-CM | POA: Diagnosis not present

## 2022-03-08 DIAGNOSIS — H26493 Other secondary cataract, bilateral: Secondary | ICD-10-CM | POA: Diagnosis not present

## 2022-03-08 DIAGNOSIS — H4423 Degenerative myopia, bilateral: Secondary | ICD-10-CM | POA: Diagnosis not present

## 2022-03-08 DIAGNOSIS — Z961 Presence of intraocular lens: Secondary | ICD-10-CM | POA: Diagnosis not present

## 2022-03-14 ENCOUNTER — Ambulatory Visit: Payer: Self-pay

## 2022-03-14 NOTE — Telephone Encounter (Signed)
  Chief Complaint: diarrhea  Symptoms: diarrhea, cold and hot sweats, fatigue, cough  Frequency: 1 week or more  Pertinent Negatives: Patient denies fever, SOB  Disposition: [] ED /[] Urgent Care (no appt availability in office) / [x] Appointment(In office/virtual)/ []  [place] [facility]/ [] Home Care/ [] Refused Recommended Disposition /[] [place] [facility]/ []  Follow-up with PCP Additional Notes: pt has been taking Imodium for diarrhea and helps with that but unsure about other sx. Scheduled OV tomorrow at 0940 for eval with PCP.   Summary: Diarrhea, Fatigues, & Coughing Advice   Pt is calling to report fatigue, with hot & cold sweats, coughing, and diarrhea for 1.5 weeks. Please advise         Reason for Disposition  Common cold with no complications  Answer Assessment - Initial Assessment Questions 1. ONSET: "When did the nasal discharge start?"      1 week or more  3. COUGH: "Do you have a cough?" If Yes, ask: "Describe the color of your sputum" (clear, white, yellow, green)     Yes, no mucus 7. OTHER SYMPTOMS: "Do you have any other symptoms?" (e.g., sore throat, earache, wheezing, vomiting)     Fatigue, hot and cold sweats, diarrhea  Protocols used: Common Cold-A-AH

## 2022-03-15 ENCOUNTER — Encounter: Payer: Self-pay | Admitting: Family Medicine

## 2022-03-15 ENCOUNTER — Other Ambulatory Visit: Payer: Self-pay | Admitting: Family Medicine

## 2022-03-15 ENCOUNTER — Ambulatory Visit (INDEPENDENT_AMBULATORY_CARE_PROVIDER_SITE_OTHER): Payer: Medicare HMO | Admitting: Family Medicine

## 2022-03-15 VITALS — BP 118/69 | HR 69 | Temp 97.7°F | Ht 61.0 in | Wt 128.0 lb

## 2022-03-15 DIAGNOSIS — F17200 Nicotine dependence, unspecified, uncomplicated: Secondary | ICD-10-CM

## 2022-03-15 DIAGNOSIS — H9201 Otalgia, right ear: Secondary | ICD-10-CM | POA: Diagnosis not present

## 2022-03-15 DIAGNOSIS — J0100 Acute maxillary sinusitis, unspecified: Secondary | ICD-10-CM | POA: Diagnosis not present

## 2022-03-15 DIAGNOSIS — R631 Polydipsia: Secondary | ICD-10-CM | POA: Diagnosis not present

## 2022-03-15 DIAGNOSIS — R634 Abnormal weight loss: Secondary | ICD-10-CM

## 2022-03-15 DIAGNOSIS — R197 Diarrhea, unspecified: Secondary | ICD-10-CM

## 2022-03-15 DIAGNOSIS — H6991 Unspecified Eustachian tube disorder, right ear: Secondary | ICD-10-CM

## 2022-03-15 MED ORDER — AZELASTINE HCL 0.1 % NA SOLN: 2.0000 | Freq: Two times a day (BID) | NASAL | 12 refills | Status: DC

## 2022-03-15 MED ORDER — SUCRALFATE 1 G PO TABS: 1.0000 g | ORAL_TABLET | Freq: Three times a day (TID) | ORAL | 0 refills | Status: DC

## 2022-03-15 MED ORDER — DOXYCYCLINE HYCLATE 100 MG PO TABS: 100.0000 mg | ORAL_TABLET | Freq: Two times a day (BID) | ORAL | 0 refills | Status: DC

## 2022-03-15 NOTE — Assessment & Plan Note (Signed)
Acute, symptoms x 2 weeks With R ear pain Mild sinus tenderness with GI concerns as well Exposure to flu; however, if previously exposed symptoms would have recovered Recommend ABX to assist with nasal spray

## 2022-03-15 NOTE — Progress Notes (Signed)
Date:  03/15/2022   Name:  Melissa Buck   DOB:  [birth year]   MRN:  [id number]  Re Introduced to nurse practitioner role and practice setting.  All questions answered.  Discussed provider/patient relationship and expectations.  Chief Complaint: Diarrhea and Ear Pain  Diarrhea  This is a new problem. Episode onset: X2 weeks. The problem occurs less than 2 times per day. The problem has been gradually improving. The stool consistency is described as Watery. The patient states that diarrhea awakens her from sleep. Associated symptoms include sweats. There are no known risk factors. Treatments tried: immodium. The treatment provided mild relief.  Otalgia  There is pain in the right ear. Chronicity: X2 weeks. The problem occurs hourly. The problem has been rapidly worsening. There has been no fever. The pain is at a severity of 3/10. The pain is mild. Associated symptoms include diarrhea. She has tried nothing for the symptoms.    Lab Results  Component Value Date   NA 139 12/14/2021   K 4.2 12/14/2021   CO2 23 12/14/2021   GLUCOSE 89 12/14/2021   BUN 7 (L) 12/14/2021   CREATININE 0.61 12/14/2021   CALCIUM 9.2 12/14/2021   EGFR 97 12/14/2021   GFRNONAA >60 11/30/2019   Lab Results  Component Value Date   CHOL 164 12/14/2021   HDL 78 12/14/2021   LDLCALC 65 12/14/2021   TRIG 119 12/14/2021   CHOLHDL 2.1 12/14/2021   Lab Results  Component Value Date   TSH 1.540 12/14/2021   Lab Results  Component Value Date   WBC 5.4 12/14/2021   HGB 12.5 12/14/2021   HCT 37.7 12/14/2021   MCV 98 (H) 12/14/2021   PLT 245 12/14/2021   Lab Results  Component Value Date   ALT 14 11/22/2020   AST 17 11/22/2020   ALKPHOS 85 11/22/2020   BILITOT 0.6 11/22/2020   Review of Systems  HENT:  Positive for ear pain.   Gastrointestinal:  Positive for diarrhea.   Patient Active Problem List   Diagnosis Date Noted   Weight loss, non-intentional 03/15/2022   Ear pain, referred, right  03/15/2022   Diarrhea 03/15/2022   Excessive thirst 03/15/2022   Tobacco dependence 03/15/2022   Acute non-recurrent maxillary sinusitis 03/15/2022   Eustachian tube disorder, right 03/15/2022   Depression with anxiety 12/14/2021   Elevated blood pressure, situational 12/14/2021   Primary insomnia 12/14/2021   Sciatica associated with disorder of lumbosacral spine 12/14/2021   Need for influenza vaccination 12/14/2021   Hypercholesteremia 03/10/2015   Current tobacco use 03/19/2002   Allergies  Allergen Reactions   Aspirin    Erythromycin     Allergic to Mycins.   Hydrocodone-Acetaminophen Itching    GI Upset   Sulfa Antibiotics     Throat closes up   Past Surgical History:  Procedure Laterality Date   ABDOMINAL HYSTERECTOMY     CERVICAL FUSION     C5 - C6 fusion   COLONOSCOPY     COLONOSCOPY WITH PROPOFOL N/A 12/11/2018   Procedure: COLONOSCOPY WITH PROPOFOL;  Surgeon: [name], MD;  Location: [place] [facility];  Service: Endoscopy;  Laterality: N/A;   TOENAIL EXCISION     Social History   Tobacco Use   Smoking status: Every Day    Packs/day: 0.25    Years: 50.00    Total pack years: 12.50    Types: Cigarettes   Smokeless tobacco: Never   Tobacco comments:    2 cigarettes daily.  Vaping Use   Vaping Use: Never used  Substance Use Topics   Alcohol use: Yes    Alcohol/week: 7.0 - 9.0 standard drinks of alcohol    Types: 7 - 9 Glasses of wine per week   Drug use: No     Medication list has been reviewed and updated.  Current Meds  Medication Sig   albuterol (PROVENTIL HFA;VENTOLIN HFA) 108 (90 Base) MCG/ACT inhaler Inhale 2 puffs into the lungs every 6 (six) hours as needed for wheezing or shortness of breath.   azelastine (ASTELIN) 0.1 % nasal spray Place 2 sprays into both nostrils 2 (two) times daily. Use in each nostril as directed   cetirizine (ZYRTEC) 10 MG tablet Take 1 tablet (10 mg total) by mouth daily.   Cholecalciferol (VITAMIN D3) 125 MCG  (5000 UT) CHEW Chew 5,000 Units by mouth daily.   doxycycline (VIBRA-TABS) 100 MG tablet Take 1 tablet (100 mg total) by mouth 2 (two) times daily.   MAGNESIUM PO Take by mouth.   meloxicam (MOBIC) 15 MG tablet Take 1 tablet (15 mg total) by mouth daily as needed for pain.   methocarbamol (ROBAXIN) 500 MG tablet    metoCLOPramide (REGLAN) 10 MG tablet Take 1 tablet (10 mg total) by mouth 3 (three) times daily as needed for nausea.   Multiple Vitamin (MULTIVITAMIN) tablet Take 1 tablet by mouth daily.   omeprazole (PRILOSEC) 40 MG capsule TAKE 1 CAPSULE EVERY DAY   Probiotic Product (PROBIOTIC ADVANCED PO) Take 1 capsule by mouth.   simvastatin (ZOCOR) 20 MG tablet Take 1 tablet (20 mg total) by mouth at bedtime.   sucralfate (CARAFATE) 1 g tablet Take 1 tablet (1 g total) by mouth 4 (four) times daily -  with meals and at bedtime.   tiZANidine (ZANAFLEX) 4 MG tablet Take 1 tablet (4 mg total) by mouth every 4 (four) hours as needed (headache). (Patient taking differently: Take 4 mg by mouth every 4 (four) hours as needed (migraine).)   traZODone (DESYREL) 50 MG tablet TAKE 1 TABLET AT BEDTIME AS NEEDED FOR SLEEP   vitamin B-12 (CYANOCOBALAMIN) 1000 MCG tablet Take 1,000 mcg by mouth daily.   [DISCONTINUED] escitalopram (LEXAPRO) 10 MG tablet Take 1 tablet (10 mg total) by mouth daily.   [DISCONTINUED] SHINGRIX injection        04/12/2019    2:55 PM 01/19/2019    4:06 PM 10/06/2018   10:15 AM  GAD 7 : Generalized Anxiety Score  Nervous, Anxious, on Edge 0 0 2  Control/stop worrying 1 0 2  Worry too much - different things 1 0 3  Trouble relaxing 0 0 2  Restless 1 1 2   Easily annoyed or irritable 0 0 3  Afraid - awful might happen 0 0 1  Total GAD 7 Score 3 1 15   Anxiety Difficulty Not difficult at all Not difficult at all Very difficult       03/15/2022    9:57 AM 12/14/2021    1:29 PM 08/01/2021    9:18 AM  Depression screen PHQ 2/9  Decreased Interest 1 0 0  Down, Depressed,  Hopeless 0 0 0  PHQ - 2 Score 1 0 0  Altered sleeping 1 0   Tired, decreased energy 2 1   Change in appetite 0 1   Feeling bad or failure about yourself  0 0   Trouble concentrating 0 1   Moving slowly or fidgety/restless 0 0   Suicidal thoughts 0 0  PHQ-9 Score 4 3   Difficult doing work/chores Not difficult at all Not difficult at all     BP Readings from Last 3 Encounters:  03/15/22 118/69  12/14/21 135/84  11/21/20 119/82    Physical Exam  Wt Readings from Last 3 Encounters:  03/15/22 128 lb (58.1 kg)  12/14/21 130 lb (59 kg)  08/01/21 145 lb (65.8 kg)    BP 118/69   Pulse 69   Temp 97.7 F (36.5 C) (Oral)   Ht 5\' 1"  (1.549 m)   Wt 128 lb (58.1 kg)   SpO2 99%   BMI 24.19 kg/m   Assessment and Plan:  Problem List Items Addressed This Visit       Respiratory   Acute non-recurrent maxillary sinusitis    Acute, symptoms x 2 weeks With R ear pain Mild sinus tenderness with GI concerns as well Exposure to flu; however, if previously exposed symptoms would have recovered Recommend ABX to assist with nasal spray       Relevant Medications   doxycycline (VIBRA-TABS) 100 MG tablet   azelastine (ASTELIN) 0.1 % nasal spray     Nervous and Auditory   Eustachian tube disorder, right    Normal exam; recommend nasal spray to assist with acute sinus complaints and concern for R ear pain       Relevant Medications   azelastine (ASTELIN) 0.1 % nasal spray     Other   Diarrhea   Relevant Medications   sucralfate (CARAFATE) 1 g tablet   Other Relevant Orders   Comprehensive Metabolic Panel (CMET)   CBC   Ear pain, referred, right - Primary    Likely in setting of sinus pain; Astelin to assist with symptoms Recommend referral to ENT if symptoms continue       Excessive thirst    Acute, denies excess urination Believe symptoms are in line with GI concerns Recommend CBC and CMP, as well as A1c given unintentional weight loss       Relevant Orders    Hemoglobin A1c   Tobacco dependence    Chronic, working on cessation Down to 2 cigs/day; denies further assistance       Weight loss, non-intentional   Relevant Orders   Comprehensive Metabolic Panel (CMET)   CBC   Hemoglobin A1c   I, Gwyneth Sprout, FNP, have reviewed all documentation for this visit. The documentation on 03/15/22 for the exam, diagnosis, procedures, and orders are all accurate and complete.

## 2022-03-15 NOTE — Assessment & Plan Note (Signed)
Acute, denies excess urination Believe symptoms are in line with GI concerns Recommend CBC and CMP, as well as A1c given unintentional weight loss

## 2022-03-15 NOTE — Assessment & Plan Note (Signed)
Patient continues to lose weight; has modified diet some. However, noticing other symptoms as well outside of weight loss. GI losses thru diarrhea and excessive thirst.

## 2022-03-15 NOTE — Assessment & Plan Note (Signed)
Chronic, working on cessation Down to 2 cigs/day; denies further assistance

## 2022-03-15 NOTE — Assessment & Plan Note (Signed)
Likely in setting of sinus pain; Astelin to assist with symptoms Recommend referral to ENT if symptoms continue

## 2022-03-15 NOTE — Assessment & Plan Note (Signed)
Normal exam; recommend nasal spray to assist with acute sinus complaints and concern for R ear pain

## 2022-03-16 LAB — COMPREHENSIVE METABOLIC PANEL
ALT: 12 IU/L (ref 0–32)
AST: 14 IU/L (ref 0–40)
Albumin/Globulin Ratio: 2 (ref 1.2–2.2)
Albumin: 4.3 g/dL (ref 3.9–4.9)
Alkaline Phosphatase: 80 IU/L (ref 44–121)
BUN/Creatinine Ratio: 16 (ref 12–28)
BUN: 11 mg/dL (ref 8–27)
Bilirubin Total: 0.5 mg/dL (ref 0.0–1.2)
CO2: 23 mmol/L (ref 20–29)
Calcium: 9.4 mg/dL (ref 8.7–10.3)
Chloride: 103 mmol/L (ref 96–106)
Creatinine, Ser: 0.68 mg/dL (ref 0.57–1.00)
Globulin, Total: 2.1 g/dL (ref 1.5–4.5)
Glucose: 82 mg/dL (ref 70–99)
Potassium: 4.4 mmol/L (ref 3.5–5.2)
Sodium: 140 mmol/L (ref 134–144)
Total Protein: 6.4 g/dL (ref 6.0–8.5)
eGFR: 94 mL/min/{1.73_m2} (ref >59)

## 2022-03-16 LAB — CBC
Hematocrit: 37.3 % (ref 34.0–46.6)
Hemoglobin: 12.5 g/dL (ref 11.1–15.9)
MCH: 32.6 pg (ref 26.6–33.0)
MCHC: 33.5 g/dL (ref 31.5–35.7)
MCV: 97 fL (ref 79–97)
Platelets: 238 10*3/uL (ref 150–450)
RBC: 3.83 x10E6/uL (ref 3.77–5.28)
RDW: 13.2 % (ref 11.7–15.4)
WBC: 4.5 10*3/uL (ref 3.4–10.8)

## 2022-03-16 LAB — HEMOGLOBIN A1C
Est. average glucose Bld gHb Est-mCnc: 111 mg/dL
Hgb A1c MFr Bld: 5.5 % (ref 4.8–5.6)

## 2022-03-18 NOTE — Progress Notes (Signed)
Labs have been reviewed and are normal and/or stable.  Please let us know if you have any questions.  Thank you, Kristina Bertone T Tyrick Dunagan, FNP  [facility] [address] [place],  [address] [phone] (phone) [phone] (fax) [facility] 

## 2022-04-12 DIAGNOSIS — H04123 Dry eye syndrome of bilateral lacrimal glands: Secondary | ICD-10-CM | POA: Diagnosis not present

## 2022-04-12 DIAGNOSIS — H26493 Other secondary cataract, bilateral: Secondary | ICD-10-CM | POA: Diagnosis not present

## 2022-04-12 DIAGNOSIS — H4423 Degenerative myopia, bilateral: Secondary | ICD-10-CM | POA: Diagnosis not present

## 2022-04-12 DIAGNOSIS — H538 Other visual disturbances: Secondary | ICD-10-CM | POA: Diagnosis not present

## 2022-04-12 DIAGNOSIS — Z961 Presence of intraocular lens: Secondary | ICD-10-CM | POA: Diagnosis not present

## 2022-04-12 DIAGNOSIS — H0288B Meibomian gland dysfunction left eye, upper and lower eyelids: Secondary | ICD-10-CM | POA: Diagnosis not present

## 2022-04-12 DIAGNOSIS — H0288A Meibomian gland dysfunction right eye, upper and lower eyelids: Secondary | ICD-10-CM | POA: Diagnosis not present

## 2022-04-22 ENCOUNTER — Other Ambulatory Visit: Payer: Self-pay | Admitting: Family Medicine

## 2022-04-22 DIAGNOSIS — R197 Diarrhea, unspecified: Secondary | ICD-10-CM

## 2022-05-06 ENCOUNTER — Other Ambulatory Visit: Payer: Self-pay | Admitting: Family Medicine

## 2022-05-06 DIAGNOSIS — G8929 Other chronic pain: Secondary | ICD-10-CM

## 2022-05-06 DIAGNOSIS — J301 Allergic rhinitis due to pollen: Secondary | ICD-10-CM

## 2022-05-07 ENCOUNTER — Ambulatory Visit (INDEPENDENT_AMBULATORY_CARE_PROVIDER_SITE_OTHER): Payer: Medicare HMO | Admitting: Physician Assistant

## 2022-05-07 ENCOUNTER — Telehealth: Payer: Self-pay | Admitting: Family Medicine

## 2022-05-07 ENCOUNTER — Ambulatory Visit: Payer: Self-pay

## 2022-05-07 ENCOUNTER — Other Ambulatory Visit: Payer: Self-pay | Admitting: *Deleted

## 2022-05-07 ENCOUNTER — Other Ambulatory Visit: Payer: Self-pay

## 2022-05-07 ENCOUNTER — Encounter: Payer: Self-pay | Admitting: Physician Assistant

## 2022-05-07 VITALS — BP 114/69 | HR 75 | Wt 127.2 lb

## 2022-05-07 DIAGNOSIS — R0789 Other chest pain: Secondary | ICD-10-CM

## 2022-05-07 DIAGNOSIS — R0602 Shortness of breath: Secondary | ICD-10-CM

## 2022-05-07 DIAGNOSIS — E7800 Pure hypercholesterolemia, unspecified: Secondary | ICD-10-CM

## 2022-05-07 DIAGNOSIS — R059 Cough, unspecified: Secondary | ICD-10-CM

## 2022-05-07 IMAGING — MG MM DIGITAL SCREENING BILAT W/ TOMO AND CAD
8 series · 8 of 24 positions shown · non-contrast
Comparison: Previous exam(s).

CLINICAL DATA: Screening.

EXAM:
DIGITAL SCREENING BILATERAL MAMMOGRAM WITH TOMO AND CAD

[L CC synth-2D]
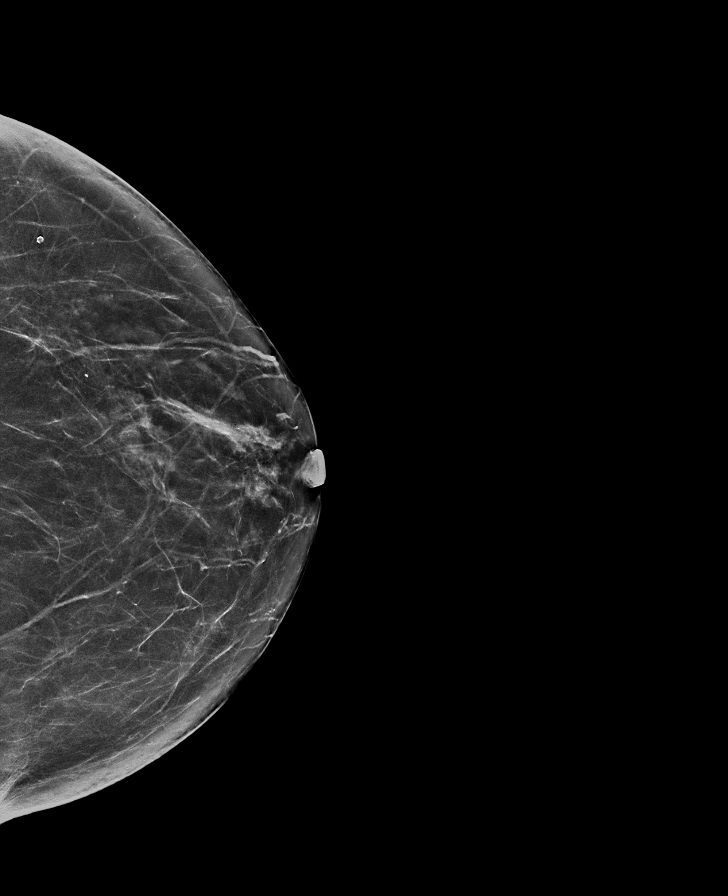

[R CC synth-2D]
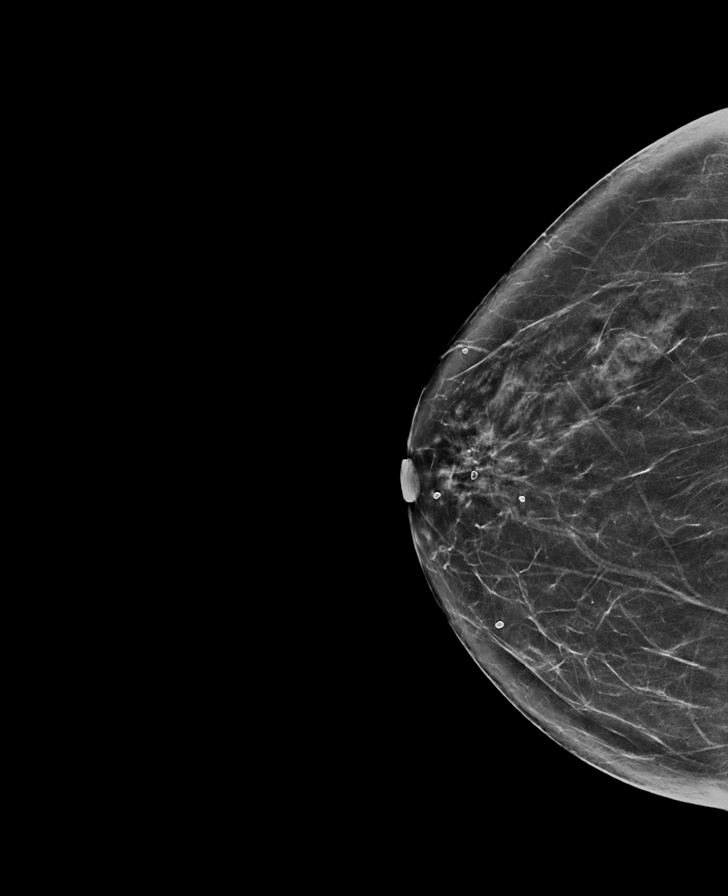

[R MLO synth-2D]
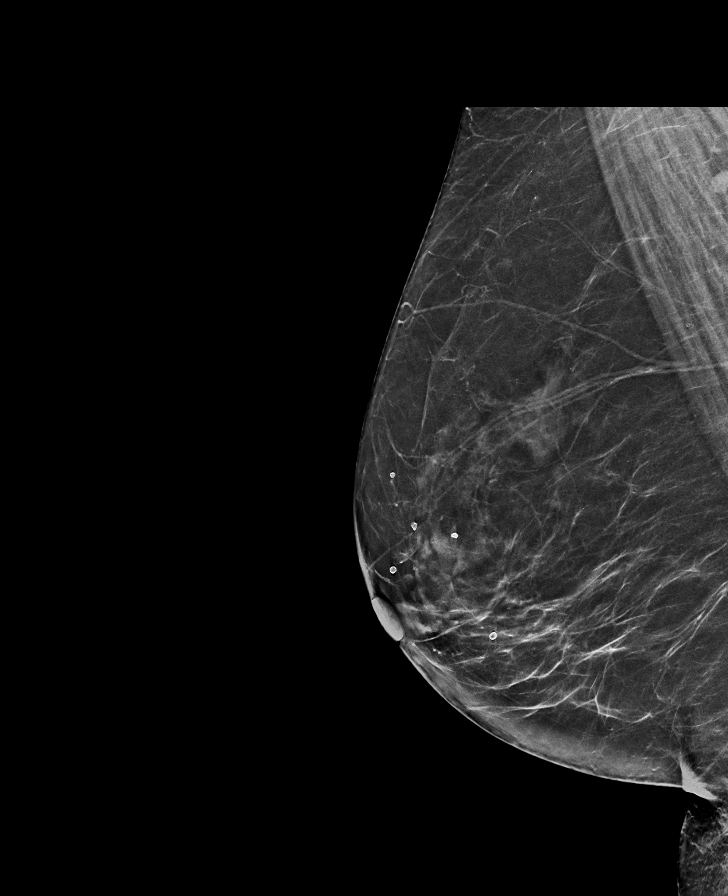

[L MLO synth-2D]
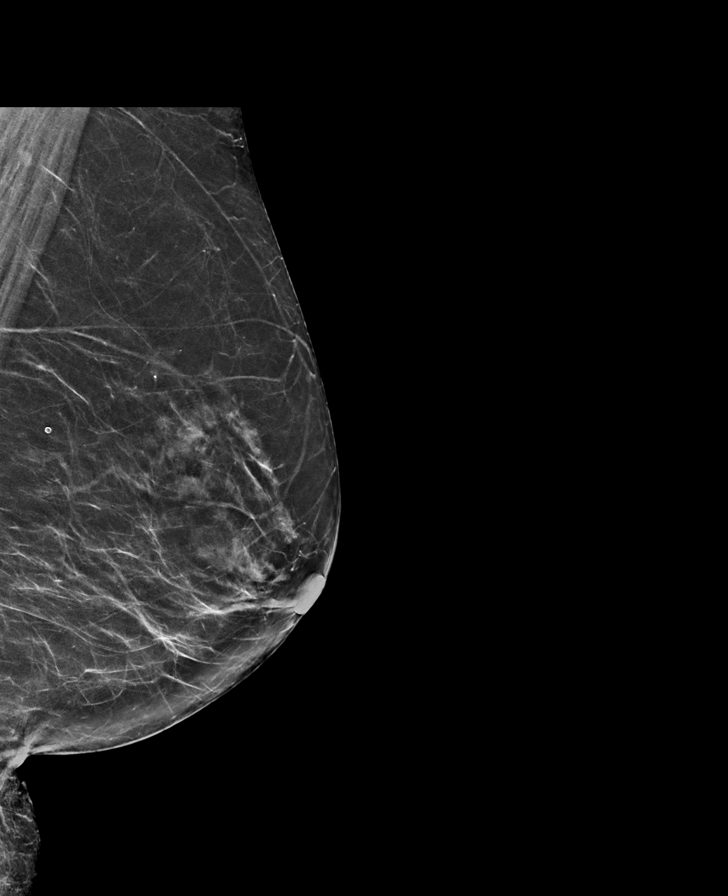

[L MLO tomo · tomo slice 34/67.0]
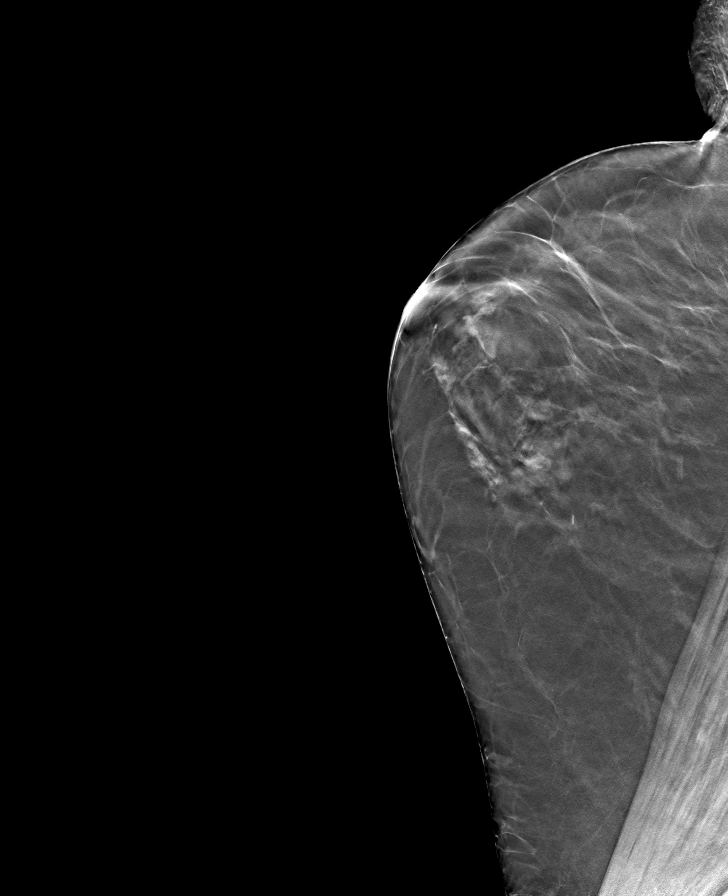

[R CC tomo · tomo slice 35/69.0]
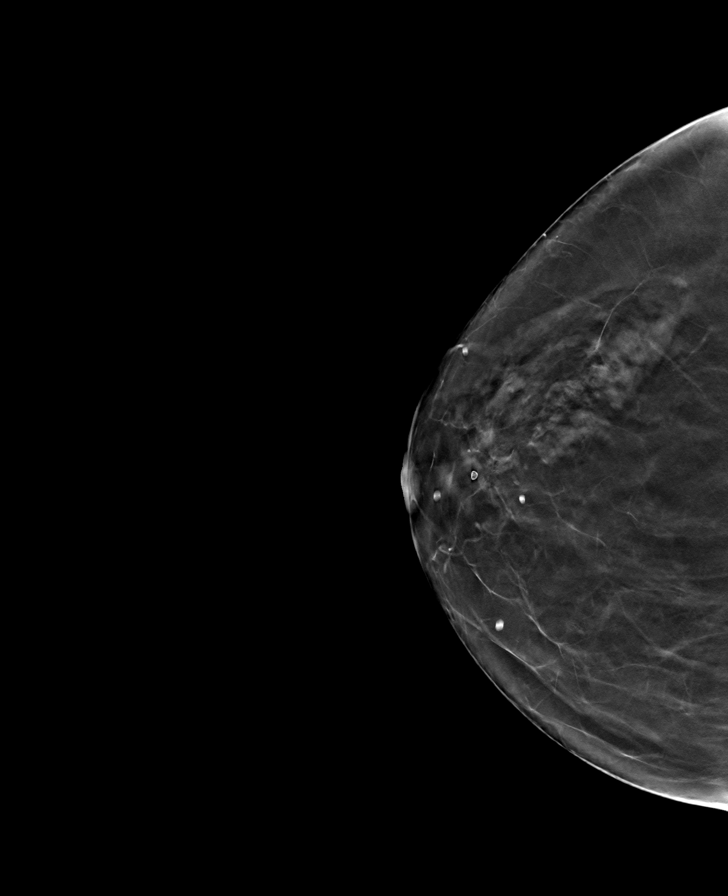

[R MLO tomo · tomo slice 35/69.0]
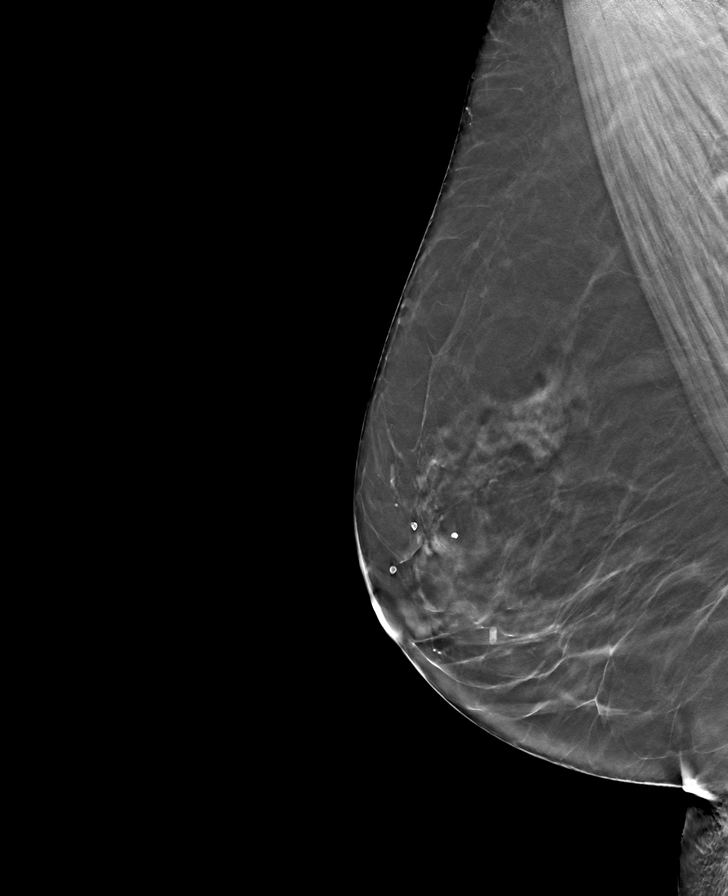

[L CC tomo · tomo slice 35/70.0]
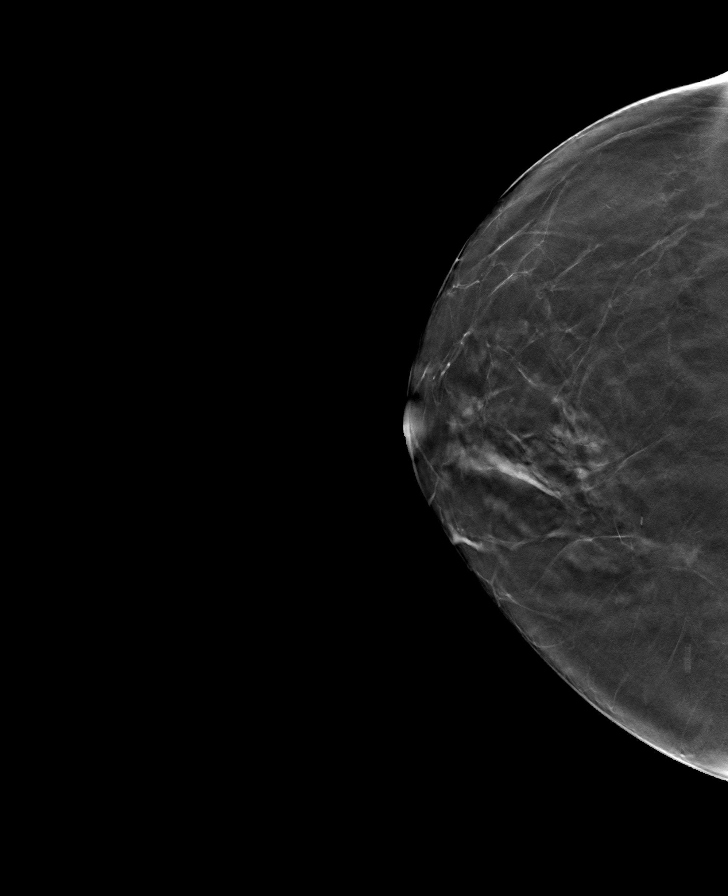

[8 of 24 positions shown; findings below may reference images not displayed]

ACR Breast Density Category b: There are scattered areas of
fibroglandular density.
FINDINGS: There are no findings suspicious for malignancy. The images were
evaluated with computer-aided detection.
IMPRESSION: No mammographic evidence of malignancy. A result letter of this
screening mammogram will be mailed directly to the patient.

RECOMMENDATION:
Screening mammogram in one year. (Code:ZP-7-VX7)

BI-RADS CATEGORY  1: Negative.

## 2022-05-07 MED ORDER — ALBUTEROL SULFATE HFA 108 (90 BASE) MCG/ACT IN AERS: 2.0000 | INHALATION_SPRAY | Freq: Four times a day (QID) | RESPIRATORY_TRACT | 0 refills | Status: DC | PRN

## 2022-05-07 MED ORDER — SIMVASTATIN 20 MG PO TABS: 20.0000 mg | ORAL_TABLET | Freq: Every day | ORAL | 3 refills | Status: DC

## 2022-05-07 NOTE — Telephone Encounter (Signed)
Requested Prescriptions  Pending Prescriptions Disp Refills   albuterol (VENTOLIN HFA) 108 (90 Base) MCG/ACT inhaler 8 g 0    Sig: Inhale 2 puffs into the lungs every 6 (six) hours as needed for wheezing or shortness of breath.     Pulmonology:  Beta Agonists 2 Passed - 05/07/2022 11:20 AM      Passed - Last BP in normal range    BP Readings from Last 1 Encounters:  03/15/22 118/69         Passed - Last Heart Rate in normal range    Pulse Readings from Last 1 Encounters:  03/15/22 69         Passed - Valid encounter within last 12 months    Recent Outpatient Visits           1 month ago Ear pain, referred, right   [place] Tally Joe T, FNP   4 months ago Depression with anxiety   [place] Tally Joe T, FNP   1 year ago Sciatica associated with disorder of lumbosacral spine   [place] Gwyneth Sprout, FNP   1 year ago Anxiety and depression   [place] Chrismon, Vickki Muff, [place]   2 years ago [place], Wendee [place], [place]       Future Appointments             Today [name], PA-C [facility], [facility]

## 2022-05-07 NOTE — Telephone Encounter (Signed)
  Chief Complaint: SOB Symptoms: SOB with rest and activity, cough Frequency: yesterday Pertinent Negatives: NA Disposition: '[]'$ ED /'[]'$ Urgent Care (no appt availability in office) / '[x]'$ Appointment(In office/virtual)/ '[]'$  [place] [facility]/ '[]'$ Home Care/ '[]'$ Refused Recommended Disposition /'[]'$ [place] [facility]/ '[]'$  Follow-up with PCP Additional Notes: pt states she felt SOB yesterday, tried to use inhaler today but d/t being so old it didn't work. Pt was asking for refill on inhaler, advised appt and scheduled for today at 1600 with Ria Comment, [place]. Pt states hasn't had SOB in years and just come out of blue. No other sx reported.   Reason for Disposition  [1] MILD difficulty breathing (e.g., minimal/no SOB at rest, SOB with walking, pulse <100) AND [2] NEW-onset or WORSE than normal  Answer Assessment - Initial Assessment Questions 1. RESPIRATORY STATUS: "Describe your breathing?" (e.g., wheezing, shortness of breath, unable to speak, severe coughing)      SOB 2. ONSET: "When did this breathing problem begin?"      Yesterday  3. PATTERN "Does the difficult breathing come and go, or has it been constant since it started?"      Comes and goes  4. SEVERITY: "How bad is your breathing?" (e.g., mild, moderate, severe)    - MILD: No SOB at rest, mild SOB with walking, speaks normally in sentences, can lie down, no retractions, pulse < 100.    - MODERATE: SOB at rest, SOB with minimal exertion and prefers to sit, cannot lie down flat, speaks in phrases, mild retractions, audible wheezing, pulse 100-120.    - SEVERE: Very SOB at rest, speaks in single words, struggling to breathe, sitting hunched forward, retractions, pulse > 120      Mild to moderate  9. OTHER SYMPTOMS: "Do you have any other symptoms? (e.g., dizziness, runny nose, cough, chest pain, fever)     cough  Protocols used: Breathing Difficulty-A-AH

## 2022-05-07 NOTE — Telephone Encounter (Signed)
Sent!

## 2022-05-07 NOTE — Telephone Encounter (Signed)
[facility] pharmacy faxed refill request for the following medications:   simvastatin (ZOCOR) 20 MG tablet    Please advise

## 2022-05-07 NOTE — Patient Instructions (Signed)
Please contact [phone] to schedule your mammogram. You will be asked your location preference to have procedure performed. You have two options listed below.  1) [facility] located at [place], [place] [address] 2) [facility] located at [address] [place], [place] [address]  Upon results being received our office will contact you. As well as all results can be viewed through your MyChart. Please feel free to contact us if you have any further questions or concerns.

## 2022-05-07 NOTE — Telephone Encounter (Signed)
Requested medication (s) are due for refill today: Yes  Requested medication (s) are on the active medication list: Yes  Last refill:  11/21/21  01/02/22  Future visit scheduled: Yes  Notes to clinic:  Prescriptions have expired.    Requested Prescriptions  Pending Prescriptions Disp Refills   meloxicam (MOBIC) 15 MG tablet [Pharmacy Med Name: MELOXICAM 15 MG Tablet] 30 tablet 11    Sig: TAKE 1 TABLET EVERY DAY AS NEEDED FOR PAIN     Analgesics:  COX2 Inhibitors Failed - 05/06/2022  4:37 PM      Failed - Manual Review: Labs are only required if the patient has taken medication for more than 8 weeks.      Passed - HGB in normal range and within 360 days    Hemoglobin  Date Value Ref Range Status  03/15/2022 12.5 11.1 - 15.9 g/dL Final         Passed - Cr in normal range and within 360 days    Creatinine  Date Value Ref Range Status  11/12/2013 0.59 (L) 0.60 - 1.30 mg/dL Final   Creatinine, Ser  Date Value Ref Range Status  03/15/2022 0.68 0.57 - 1.00 mg/dL Final         Passed - HCT in normal range and within 360 days    Hematocrit  Date Value Ref Range Status  03/15/2022 37.3 34.0 - 46.6 % Final         Passed - AST in normal range and within 360 days    AST  Date Value Ref Range Status  03/15/2022 14 0 - 40 IU/L Final   SGOT(AST)  Date Value Ref Range Status  11/12/2013 16 15 - 37 Unit/L Final         Passed - ALT in normal range and within 360 days    ALT  Date Value Ref Range Status  03/15/2022 12 0 - 32 IU/L Final   SGPT (ALT)  Date Value Ref Range Status  11/12/2013 18 U/L Final    Comment:    14-63 NOTE: New Reference Range 09/21/13          Passed - eGFR is 30 or above and within 360 days    EGFR (African American)  Date Value Ref Range Status  11/12/2013 >60  Final   GFR calc Af Amer  Date Value Ref Range Status  11/30/2019 >60 >60 mL/min Final   EGFR (Non-African Amer.)  Date Value Ref Range Status  11/12/2013 >60  Final     Comment:    eGFR values <31m/min/1.73 m2 may be an indication of chronic kidney disease (CKD). Calculated eGFR is useful in patients with stable renal function. The eGFR calculation will not be reliable in acutely ill patients when serum creatinine is changing rapidly. It is not useful in  patients on dialysis. The eGFR calculation may not be applicable to patients at the low and high extremes of body sizes, pregnant women, and vegetarians.    GFR calc non Af Amer  Date Value Ref Range Status  11/30/2019 >60 >60 mL/min Final   eGFR  Date Value Ref Range Status  03/15/2022 94 >59 mL/min/1.73 Final         Passed - Patient is not pregnant      Passed - Valid encounter within last 12 months    Recent Outpatient Visits           1 month ago [facility] pain, [facility], right   [facility]wyn[facility] Sprout, FNP   4 months ago Depression with anxiety   [place] Tally Joe T, FNP   1 year ago Sciatica associated with disorder of lumbosacral spine   [place] Gwyneth Sprout, FNP   1 year ago Anxiety and depression   [place] Chrismon, Vickki Muff, PA-C   2 years ago [place], [place]       Future Appointments             Today [name], PA-C [place]             cetirizine (ZYRTEC) 10 MG tablet [Pharmacy Med Name: CETIRIZINE HYDROCHLORIDE 10 MG Tablet] 90 tablet 3    Sig: Take 1 tablet (10 mg total) by mouth daily.     Ear, Nose, and Throat:  Antihistamines 2 Passed - 05/06/2022  4:37 PM      Passed - Cr in normal range and within 360 days    Creatinine  Date Value Ref Range Status  11/12/2013 0.59 (L) 0.60 - 1.30 mg/dL Final   Creatinine, Ser  Date Value Ref Range Status  03/15/2022 0.68 0.57 - 1.00 mg/dL Final         Passed - Valid encounter within last 12  months    Recent Outpatient Visits           1 month ago Ear pain, referred, right   [place] Tally Joe T, FNP   4 months ago Depression with anxiety   [place] Tally Joe T, FNP   1 year ago Sciatica associated with disorder of lumbosacral spine   [place] Gwyneth Sprout, FNP   1 year ago Anxiety and depression   [place] Chrismon, Vickki Muff, [place]   2 years ago [place], [place]       Future Appointments             Today [name], PA-C [facility], [facility]

## 2022-05-07 NOTE — Progress Notes (Signed)
I,Sha'taria Tyson,acting as a Education administrator for [employer], PA-C.,have documented all relevant documentation on the behalf of Melissa Kirschner, PA-C,as directed by  Melissa Kirschner, PA-C while in the presence of Melissa Kirschner, PA-C.   Established patient visit   Patient: Melissa Buck   DOB: 27-Nov-1950   72 y.o. Female  MRN: [id number] Visit Date: 05/07/2022  Today's healthcare provider: [name], PA-C   Cc. Sob/chest tightness  Subjective     Pt reports shortness of breath/chest tightness starting yesterday. She reports she has been more mobile/more physically active than she has been in a while. She used to have an albuterol inhaler, hx of COPD. She is a smoker, smokes ~ 1-3 cigarettes a day. She reports a history of GERD.  Denies chest pain, wheezing, dizziness.  Medications: Outpatient Medications Prior to Visit  Medication Sig   albuterol (VENTOLIN HFA) 108 (90 Base) MCG/ACT inhaler Inhale 2 puffs into the lungs every 6 (six) hours as needed for wheezing or shortness of breath.   azelastine (ASTELIN) 0.1 % nasal spray Place 2 sprays into both nostrils 2 (two) times daily. Use in each nostril as directed   cetirizine (ZYRTEC) 10 MG tablet TAKE 1 TABLET (10 MG TOTAL) BY MOUTH DAILY.   Cholecalciferol (VITAMIN D3) 125 MCG (5000 UT) CHEW Chew 5,000 Units by mouth daily.   doxycycline (VIBRA-TABS) 100 MG tablet Take 1 tablet (100 mg total) by mouth 2 (two) times daily.   MAGNESIUM PO Take by mouth.   meloxicam (MOBIC) 15 MG tablet TAKE 1 TABLET EVERY DAY AS NEEDED FOR PAIN   methocarbamol (ROBAXIN) 500 MG tablet    metoCLOPramide (REGLAN) 10 MG tablet Take 1 tablet (10 mg total) by mouth 3 (three) times daily as needed for nausea.   Multiple Vitamin (MULTIVITAMIN) tablet Take 1 tablet by mouth daily.   omeprazole (PRILOSEC) 40 MG capsule TAKE 1 CAPSULE EVERY DAY   Probiotic Product (PROBIOTIC ADVANCED PO) Take 1 capsule by mouth.   sucralfate (CARAFATE) 1 g tablet TAKE 1  TABLET(1 GRAM) BY MOUTH FOUR TIMES DAILY AT BEDTIME WITH MEALS   tiZANidine (ZANAFLEX) 4 MG tablet Take 1 tablet (4 mg total) by mouth every 4 (four) hours as needed (headache). (Patient taking differently: Take 4 mg by mouth every 4 (four) hours as needed (migraine).)   traZODone (DESYREL) 50 MG tablet TAKE 1 TABLET AT BEDTIME AS NEEDED FOR SLEEP   vitamin B-12 (CYANOCOBALAMIN) 1000 MCG tablet Take 1,000 mcg by mouth daily.   [DISCONTINUED] simvastatin (ZOCOR) 20 MG tablet Take 1 tablet (20 mg total) by mouth at bedtime.   No facility-administered medications prior to visit.    Review of Systems  Respiratory:  Positive for shortness of breath.      Objective    BP 114/69 (BP Location: Left Arm, Patient Position: Sitting, Cuff Size: Normal)   Pulse 75   Wt 127 lb 3.2 oz (57.7 kg)   SpO2 100%   BMI 24.03 kg/m   Physical Exam Constitutional:      General: She is awake.     Appearance: She is well-developed.  HENT:     Head: Normocephalic.  Eyes:     Conjunctiva/sclera: Conjunctivae normal.  Cardiovascular:     Rate and Rhythm: Normal rate and regular rhythm.     Heart sounds: Normal heart sounds.  Pulmonary:     Effort: Pulmonary effort is normal.     Breath sounds: Normal breath sounds. No wheezing, rhonchi or rales.  Skin:  General: Skin is warm.  Neurological:     Mental Status: She is alert and oriented to person, place, and time.  Psychiatric:        Attention and Perception: Attention normal.        Mood and Affect: Mood normal.        Speech: Speech normal.        Behavior: Behavior is cooperative.     No results found for any visits on 05/07/22.  Assessment & Plan     Chest tightness/ SOB Pt already picked up rx of albuterol. Encouraged use if she feels chest tightness, but lungs CTA today. Pt is not on a chronic COPD regimen. Also suggested tx from GERD perspective w/ tums and/or pepcid in addition to her omeprazole.  EKG done today with no acute  findings; no ST elevation/T wave abnormalities. Regular (trigeminal?) PVCs are present. This is stable from 9/21 prior EKG.  Advised pt if symptoms persist, worsen or change to contact office   I, Melissa Kirschner, PA-C have reviewed all documentation for this visit. The documentation on  05/07/22 for the exam, diagnosis, procedures, and orders are all accurate and complete.  Melissa Kirschner, PA-C [facility] [address] [place], [place], [address] Office: [phone] Fax: [place]

## 2022-05-10 ENCOUNTER — Other Ambulatory Visit: Payer: Self-pay | Admitting: Family Medicine

## 2022-05-28 ENCOUNTER — Ambulatory Visit: Payer: Self-pay | Admitting: *Deleted
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
## 2022-05-29 ENCOUNTER — Encounter: Payer: Self-pay | Admitting: Family Medicine

## 2022-05-29 ENCOUNTER — Ambulatory Visit (INDEPENDENT_AMBULATORY_CARE_PROVIDER_SITE_OTHER): Payer: Medicare HMO | Admitting: Family Medicine

## 2022-05-29 ENCOUNTER — Other Ambulatory Visit: Payer: Self-pay | Admitting: *Deleted

## 2022-05-29 VITALS — BP 119/71 | HR 80 | Temp 97.9°F | Resp 20 | Wt 127.8 lb

## 2022-05-29 DIAGNOSIS — T7421XS Adult sexual abuse, confirmed, sequela: Secondary | ICD-10-CM

## 2022-05-29 DIAGNOSIS — T7421XA Adult sexual abuse, confirmed, initial encounter: Secondary | ICD-10-CM | POA: Insufficient documentation

## 2022-05-29 DIAGNOSIS — F333 Major depressive disorder, recurrent, severe with psychotic symptoms: Secondary | ICD-10-CM

## 2022-05-29 MED ORDER — BUSPIRONE HCL 5 MG PO TABS: 5.0000 mg | ORAL_TABLET | Freq: Three times a day (TID) | ORAL | 2 refills | Status: DC | PRN

## 2022-05-29 MED ORDER — ESCITALOPRAM OXALATE 10 MG PO TABS: 10.0000 mg | ORAL_TABLET | Freq: Every day | ORAL | 2 refills | Status: DC

## 2022-05-29 NOTE — Telephone Encounter (Signed)
Pharmacy calling to request refill status of lexapro and buspar per agent. Upon transfer to [place] call disconnected. Will request medication refills and send to pharmacy again.

## 2022-05-29 NOTE — Telephone Encounter (Signed)
Resending to pharmacy again per request from pharmacy.   Requested Prescriptions  Pending Prescriptions Disp Refills   escitalopram (LEXAPRO) 10 MG tablet 30 tablet 2    Sig: Take 1 tablet (10 mg total) by mouth at bedtime.     Psychiatry:  Antidepressants - SSRI Passed - 05/29/2022  5:32 PM      Passed - Completed PHQ-2 or PHQ-9 in the last 360 days      Passed - Valid encounter within last 6 months    Recent Outpatient Visits           Today Severe episode of recurrent major depressive disorder, with psychotic features [facility])   [place] Gwyneth Sprout, FNP   3 weeks ago Chest tightness   [place] [name], PA-C   2 months ago Ear pain, referred, right   [facility] Tally Joe T, FNP   5 months ago Depression with anxiety   [place] Tally Joe T, FNP   1 year ago Sciatica associated with disorder of lumbosacral spine   [place] Tally Joe T, FNP       Future Appointments             In 2 weeks Gwyneth Sprout, [place], [facility]             busPIRone (BUSPAR) 5 MG tablet 90 tablet 2    Sig: Take 1 tablet (5 mg total) by mouth 3 (three) times daily as needed.     Psychiatry: Anxiolytics/Hypnotics - Non-controlled Passed - 05/29/2022  5:32 PM      Passed - Valid encounter within last 12 months    Recent Outpatient Visits           Today Severe episode of recurrent major depressive disorder, with psychotic features [facility])   [place] Gwyneth Sprout, FNP   3 weeks ago Chest tightness   [place] [name], PA-C   2 months ago Ear pain, referred, right   [facility] Tally Joe T, FNP   5 months ago Depression with anxiety   [place] Gwyneth Sprout, FNP   1 year ago  Sciatica associated with disorder of lumbosacral spine   [place] Gwyneth Sprout, FNP       Future Appointments             In 2 weeks Gwyneth Sprout, [place], [facility]
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
## 2022-06-17 ENCOUNTER — Ambulatory Visit: Payer: Medicare HMO | Admitting: Family Medicine

## 2022-06-17 NOTE — Progress Notes (Deleted)
     I,J'ya E Aireanna Luellen,acting as a scribe for [name], FNP.,have documented all relevant documentation on the behalf of [name], FNP,as directed by  [name], FNP while in the presence of [name], FNP.   Established patient visit   Patient: Melissa Buck   DOB: [birth year]   72 y.o. Female  MRN: [id number] Visit Date: 06/17/2022  Today's healthcare provider: [name], FNP   No chief complaint on file.  Subjective    HPI  ***  Medications: Outpatient Medications Prior to Visit  Medication Sig   albuterol (VENTOLIN HFA) 108 (90 Base) MCG/ACT inhaler Inhale 2 puffs into the lungs every 6 (six) hours as needed for wheezing or shortness of breath.   azelastine (ASTELIN) 0.1 % nasal spray Place 2 sprays into both nostrils 2 (two) times daily. Use in each nostril as directed   busPIRone (BUSPAR) 5 MG tablet Take 1 tablet (5 mg total) by mouth 3 (three) times daily as needed.   cetirizine (ZYRTEC) 10 MG tablet TAKE 1 TABLET (10 MG TOTAL) BY MOUTH DAILY.   Cholecalciferol (VITAMIN D3) 125 MCG (5000 UT) CHEW Chew 5,000 Units by mouth daily.   doxycycline (VIBRA-TABS) 100 MG tablet Take 1 tablet (100 mg total) by mouth 2 (two) times daily.   escitalopram (LEXAPRO) 10 MG tablet Take 1 tablet (10 mg total) by mouth at bedtime.   MAGNESIUM PO Take by mouth.   meloxicam (MOBIC) 15 MG tablet TAKE 1 TABLET EVERY DAY AS NEEDED FOR PAIN   methocarbamol (ROBAXIN) 500 MG tablet    metoCLOPramide (REGLAN) 10 MG tablet Take 1 tablet (10 mg total) by mouth 3 (three) times daily as needed for nausea.   Multiple Vitamin (MULTIVITAMIN) tablet Take 1 tablet by mouth daily.   omeprazole (PRILOSEC) 40 MG capsule TAKE 1 CAPSULE EVERY DAY   Probiotic Product (PROBIOTIC ADVANCED PO) Take 1 capsule by mouth.   simvastatin (ZOCOR) 20 MG tablet Take 1 tablet (20 mg total) by mouth at bedtime.   sucralfate (CARAFATE) 1 g tablet TAKE 1 TABLET(1 GRAM) BY MOUTH FOUR TIMES DAILY AT BEDTIME WITH  MEALS   tiZANidine (ZANAFLEX) 4 MG tablet Take 1 tablet (4 mg total) by mouth every 4 (four) hours as needed (headache). (Patient taking differently: Take 4 mg by mouth every 4 (four) hours as needed (migraine).)   traZODone (DESYREL) 50 MG tablet TAKE 1 TABLET AT BEDTIME AS NEEDED FOR SLEEP   vitamin B-12 (CYANOCOBALAMIN) 1000 MCG tablet Take 1,000 mcg by mouth daily.   No facility-administered medications prior to visit.    Review of Systems  {Labs  Heme  Chem  Endocrine  Serology  Results Review (optional):23779}   Objective    There were no vitals taken for this visit. {Show previous vital signs (optional):23777}  Physical Exam  ***  No results found for any visits on 06/17/22.  Assessment & Plan     ***  No follow-ups on file.      {provider attestation***:1}   [name], FNP  [facility] [phone] (phone) [phone] (fax)  [facility]
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
## 2022-06-18 ENCOUNTER — Ambulatory Visit (INDEPENDENT_AMBULATORY_CARE_PROVIDER_SITE_OTHER): Payer: Medicare HMO | Admitting: Family Medicine

## 2022-06-18 ENCOUNTER — Encounter: Payer: Self-pay | Admitting: Family Medicine

## 2022-06-18 VITALS — BP 103/75 | HR 78 | Temp 98.1°F | Ht 61.0 in | Wt 125.3 lb

## 2022-06-18 DIAGNOSIS — J449 Chronic obstructive pulmonary disease, unspecified: Secondary | ICD-10-CM | POA: Diagnosis not present

## 2022-06-18 DIAGNOSIS — F333 Major depressive disorder, recurrent, severe with psychotic symptoms: Secondary | ICD-10-CM

## 2022-06-18 MED ORDER — ESCITALOPRAM OXALATE 10 MG PO TABS: 10.0000 mg | ORAL_TABLET | Freq: Every day | ORAL | 3 refills | Status: DC
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
## 2022-07-09 ENCOUNTER — Telehealth: Payer: Self-pay | Admitting: Family Medicine

## 2022-07-09 DIAGNOSIS — H6991 Unspecified Eustachian tube disorder, right ear: Secondary | ICD-10-CM

## 2022-07-09 DIAGNOSIS — R059 Cough, unspecified: Secondary | ICD-10-CM

## 2022-07-09 MED ORDER — AZELASTINE HCL 0.1 % NA SOLN
2.0000 | Freq: Two times a day (BID) | NASAL | 12 refills | Status: AC
Start: 2022-07-09 — End: ?

## 2022-07-09 MED ORDER — ALBUTEROL SULFATE HFA 108 (90 BASE) MCG/ACT IN AERS: 2.0000 | INHALATION_SPRAY | Freq: Four times a day (QID) | RESPIRATORY_TRACT | 3 refills | Status: DC | PRN

## 2022-07-09 NOTE — Telephone Encounter (Signed)
[facility] faxed refill request for the following medications:   albuterol (VENTOLIN HFA) 108 (90 Base) MCG/ACT inhaler   azelastine (ASTELIN) 0.1 % nasal spray   Please advise

## 2022-08-01 DIAGNOSIS — M7542 Impingement syndrome of left shoulder: Secondary | ICD-10-CM | POA: Diagnosis not present

## 2022-08-05 ENCOUNTER — Ambulatory Visit (INDEPENDENT_AMBULATORY_CARE_PROVIDER_SITE_OTHER): Payer: Medicare HMO

## 2022-08-05 VITALS — Ht 61.0 in | Wt 123.0 lb

## 2022-08-05 DIAGNOSIS — Z1231 Encounter for screening mammogram for malignant neoplasm of breast: Secondary | ICD-10-CM

## 2022-08-05 DIAGNOSIS — Z0000 Encounter for general adult medical examination without abnormal findings: Secondary | ICD-10-CM

## 2022-08-05 NOTE — Patient Instructions (Addendum)
Melissa Buck , Thank you for taking time to come for your Medicare Wellness Visit. I appreciate your ongoing commitment to your health goals. Please review the following plan we discussed and let me know if I can assist you in the future.   These are the goals we discussed:  Goals      DIET - EAT MORE FRUITS AND VEGETABLES     Exercise 3x per week (30 min per time)     Recommend to exercise for 3 days a week for at least 30 minutes at a time.      Manage My Emotions     Timeframe:  Long-Range Goal Priority:  Medium Start Date:     11/30/20                        Expected End Date:  01/02/21                     Follow Up Date 01/02/21    - practice positive thinking and self-talk -continue with healthy diet and exercise -continue to take medications as prescribed  -contact [facility] social worker with any additional mental health/community resource needs   Why is this important?   When you are stressed, down or upset, your body reacts too.  For example, your blood pressure may get higher; you may have a headache or stomachache.  When your emotions get the best of you, your body's ability to fight off cold and flu gets weak.  These steps will help you manage your emotions.     Notes:      Manage My Medicine     Timeframe:  Long-Range Goal Priority:  High Start Date: 01/02/2021                            Expected End Date: 01/02/2022                      Follow Up within 90 days   - call for medicine refill 2 or 3 days before it runs out - keep a list of all the medicines I take; vitamins and herbals too - use a pillbox to sort medicine    Why is this important?   These steps will help you keep on track with your medicines.   Notes:      Quit Smoking     Recommend to continue efforts to reduce smoking habits until no longer smoking.         This is a list of the screening recommended for you and due dates:  Health Maintenance  Topic Date Due   COVID-19 Vaccine (3 - 2023-24  season) 11/02/2021   Zoster (Shingles) Vaccine (2 of 2) 02/08/2022   Mammogram  03/28/2022   Flu Shot  10/03/2022   Medicare Annual Wellness Visit  08/05/2023   Colon Cancer Screening  12/11/2023   DEXA scan (bone density measurement)  02/08/2026   DTaP/Tdap/Td vaccine (3 - Td or Tdap) 07/19/2026   Pneumonia Vaccine  Completed   Hepatitis C Screening  Completed   HPV Vaccine  Aged Out    Advanced directives: yes  Conditions/risks identified: low falls risk  Next appointment: Follow up in one year for your annual wellness visit 08/06/2023 @ 9:15 am telephone   Preventive Care 65 Years and Older, Female Preventive care refers to lifestyle choices and visits with your health  care provider that can promote health and wellness. What does preventive care include? A yearly physical exam. This is also called an annual well check. Dental exams once or twice a year. Routine eye exams. Ask your health care provider how often you should have your eyes checked. Personal lifestyle choices, including: Daily care of your teeth and gums. Regular physical activity. Eating a healthy diet. Avoiding tobacco and drug use. Limiting alcohol use. Practicing safe sex. Taking low-dose aspirin every day. Taking vitamin and mineral supplements as recommended by your health care provider. What happens during an annual well check? The services and screenings done by your health care provider during your annual well check will depend on your age, overall health, lifestyle risk factors, and family history of disease. Counseling  Your health care provider may ask you questions about your: Alcohol use. Tobacco use. Drug use. Emotional well-being. Home and relationship well-being. Sexual activity. Eating habits. History of falls. Memory and ability to understand (cognition). Work and work Astronomer. Reproductive health. Screening  You may have the following tests or measurements: Height, weight,  and BMI. Blood pressure. Lipid and cholesterol levels. These may be checked every 5 years, or more frequently if you are over 28 years old. Skin check. Lung cancer screening. You may have this screening every year starting at age 34 if you have a 30-pack-year history of smoking and currently smoke or have quit within the past 15 years. Fecal occult blood test (FOBT) of the stool. You may have this test every year starting at age 21. Flexible sigmoidoscopy or colonoscopy. You may have a sigmoidoscopy every 5 years or a colonoscopy every 10 years starting at age 65. Hepatitis C blood test. Hepatitis B blood test. Sexually transmitted disease (STD) testing. Diabetes screening. This is done by checking your blood sugar (glucose) after you have not eaten for a while (fasting). You may have this done every 1-3 years. Bone density scan. This is done to screen for osteoporosis. You may have this done starting at age 22. Mammogram. This may be done every 1-2 years. Talk to your health care provider about how often you should have regular mammograms. Talk with your health care provider about your test results, treatment options, and if necessary, the need for more tests. Vaccines  Your health care provider may recommend certain vaccines, such as: Influenza vaccine. This is recommended every year. Tetanus, diphtheria, and acellular pertussis (Tdap, Td) vaccine. You may need a Td booster every 10 years. Zoster vaccine. You may need this after age 74. Pneumococcal 13-valent conjugate (PCV13) vaccine. One dose is recommended after age 15. Pneumococcal polysaccharide (PPSV23) vaccine. One dose is recommended after age 43. Talk to your health care provider about which screenings and vaccines you need and how often you need them. This information is not intended to replace advice given to you by your health care provider. Make sure you discuss any questions you have with your health care provider. Document  Released: 03/17/2015 Document Revised: 11/08/2015 Document Reviewed: 12/20/2014 Elsevier Interactive Patient Education  2017 [employer].  Fall Prevention in the Home Falls can cause injuries. They can happen to people of all ages. There are many things you can do to make your home safe and to help prevent falls. What can I do on the outside of my home? Regularly fix the edges of walkways and driveways and fix any cracks. Remove anything that might make you trip as you walk through a door, such as a raised step or  threshold. Trim any bushes or trees on the path to your home. Use bright outdoor lighting. Clear any walking paths of anything that might make someone trip, such as rocks or tools. Regularly check to see if handrails are loose or broken. Make sure that both sides of any steps have handrails. Any raised decks and porches should have guardrails on the edges. Have any leaves, snow, or ice cleared regularly. Use sand or salt on walking paths during winter. Clean up any spills in your garage right away. This includes oil or grease spills. What can I do in the bathroom? Use night lights. Install grab bars by the toilet and in the tub and shower. Do not use towel bars as grab bars. Use non-skid mats or decals in the tub or shower. If you need to sit down in the shower, use a plastic, non-slip stool. Keep the floor dry. Clean up any water that spills on the floor as soon as it happens. Remove soap buildup in the tub or shower regularly. Attach bath mats securely with double-sided non-slip rug tape. Do not have throw rugs and other things on the floor that can make you trip. What can I do in the bedroom? Use night lights. Make sure that you have a light by your bed that is easy to reach. Do not use any sheets or blankets that are too big for your bed. They should not hang down onto the floor. Have a firm chair that has side arms. You can use this for support while you get dressed. Do  not have throw rugs and other things on the floor that can make you trip. What can I do in the kitchen? Clean up any spills right away. Avoid walking on wet floors. Keep items that you use a lot in easy-to-reach places. If you need to reach something above you, use a strong step stool that has a grab bar. Keep electrical cords out of the way. Do not use floor polish or wax that makes floors slippery. If you must use wax, use non-skid floor wax. Do not have throw rugs and other things on the floor that can make you trip. What can I do with my stairs? Do not leave any items on the stairs. Make sure that there are handrails on both sides of the stairs and use them. Fix handrails that are broken or loose. Make sure that handrails are as long as the stairways. Check any carpeting to make sure that it is firmly attached to the stairs. Fix any carpet that is loose or worn. Avoid having throw rugs at the top or bottom of the stairs. If you do have throw rugs, attach them to the floor with carpet tape. Make sure that you have a light switch at the top of the stairs and the bottom of the stairs. If you do not have them, ask someone to add them for you. What else can I do to help prevent falls? Wear shoes that: Do not have high heels. Have rubber bottoms. Are comfortable and fit you well. Are closed at the toe. Do not wear sandals. If you use a stepladder: Make sure that it is fully opened. Do not climb a closed stepladder. Make sure that both sides of the stepladder are locked into place. Ask someone to hold it for you, if possible. Clearly mark and make sure that you can see: Any grab bars or handrails. First and last steps. Where the edge of each step is. Use  tools that help you move around (mobility aids) if they are needed. These include: Canes. Walkers. Scooters. Crutches. Turn on the lights when you go into a dark area. Replace any light bulbs as soon as they burn out. Set up your  furniture so you have a clear path. Avoid moving your furniture around. If any of your floors are uneven, fix them. If there are any pets around you, be aware of where they are. Review your medicines with your doctor. Some medicines can make you feel dizzy. This can increase your chance of falling. Ask your doctor what other things that you can do to help prevent falls. This information is not intended to replace advice given to you by your health care provider. Make sure you discuss any questions you have with your health care provider. Document Released: 12/15/2008 Document Revised: 07/27/2015 Document Reviewed: 03/25/2014 Elsevier Interactive Patient Education  2017 [employer].

## 2022-08-05 NOTE — Progress Notes (Signed)
I connected with  Melissa Buck on 08/05/22 by a audio enabled telemedicine application and verified that I am speaking with the correct person using two identifiers.  Patient Location: Home  Provider Location: Office/Clinic  I discussed the limitations of evaluation and management by telemedicine. The patient expressed understanding and agreed to proceed.  Subjective:   Melissa Buck is a 72 y.o. female who presents for Medicare Annual (Subsequent) preventive examination.  Review of Systems    Cardiac Risk Factors include: advanced age (>75men, >69 women);dyslipidemia    Objective:    Today's Vitals   08/05/22 0925 08/05/22 0926  Weight: 123 lb (55.8 kg)   Height: 5\' 1"  (1.549 m)   PainSc:  6    Body mass index is 23.24 kg/m.     08/05/2022    9:37 AM 08/01/2021    9:21 AM 10/26/2020   11:55 AM 02/16/2020    1:42 PM 11/30/2019    5:06 PM 01/19/2019    3:34 PM 12/11/2018    8:50 AM  Advanced Directives  Does Patient Have a Medical Advance Directive? Yes No No No No No No  Type of Estate agent of [place];Living will   Healthcare Power of [place];Living will     Copy of Healthcare Power of Attorney in Chart?    No - copy requested     Would patient like information on creating a medical advance directive?  No - Patient declined No - Patient declined No - Patient declined  No - Patient declined Yes (MAU/Ambulatory/Procedural Areas - Information given)    Current Medications (verified) Outpatient Encounter Medications as of 08/05/2022  Medication Sig   albuterol (VENTOLIN HFA) 108 (90 Base) MCG/ACT inhaler Inhale 2 puffs into the lungs every 6 (six) hours as needed for wheezing or shortness of breath.   azelastine (ASTELIN) 0.1 % nasal spray Place 2 sprays into both nostrils 2 (two) times daily. Use in each nostril as directed   busPIRone (BUSPAR) 5 MG tablet Take 1 tablet (5 mg total) by mouth 3 (three) times daily as needed.   cetirizine (ZYRTEC) 10  MG tablet TAKE 1 TABLET (10 MG TOTAL) BY MOUTH DAILY.   Cholecalciferol (VITAMIN D3) 125 MCG (5000 UT) CHEW Chew 5,000 Units by mouth daily.   escitalopram (LEXAPRO) 10 MG tablet Take 1 tablet (10 mg total) by mouth at bedtime.   MAGNESIUM PO Take by mouth.   meloxicam (MOBIC) 15 MG tablet TAKE 1 TABLET EVERY DAY AS NEEDED FOR PAIN   methocarbamol (ROBAXIN) 500 MG tablet    metoCLOPramide (REGLAN) 10 MG tablet Take 1 tablet (10 mg total) by mouth 3 (three) times daily as needed for nausea.   Multiple Vitamin (MULTIVITAMIN) tablet Take 1 tablet by mouth daily.   omeprazole (PRILOSEC) 40 MG capsule TAKE 1 CAPSULE EVERY DAY   Probiotic Product (PROBIOTIC ADVANCED PO) Take 1 capsule by mouth.   simvastatin (ZOCOR) 20 MG tablet Take 1 tablet (20 mg total) by mouth at bedtime.   tiZANidine (ZANAFLEX) 4 MG tablet Take 1 tablet (4 mg total) by mouth every 4 (four) hours as needed (headache). (Patient taking differently: Take 4 mg by mouth every 4 (four) hours as needed (migraine).)   traZODone (DESYREL) 50 MG tablet TAKE 1 TABLET AT BEDTIME AS NEEDED FOR SLEEP   vitamin B-12 (CYANOCOBALAMIN) 1000 MCG tablet Take 1,000 mcg by mouth daily.   No facility-administered encounter medications on file as of 08/05/2022.    Allergies (verified) Aspirin, Erythromycin,  Hydrocodone-acetaminophen, and Sulfa antibiotics   History: Past Medical History:  Diagnosis Date   Arthritis    knees, ankles,    Asthma    Colitis    COPD (chronic obstructive pulmonary disease) (HCC)    GERD (gastroesophageal reflux disease)    Headache    history of migraines   Hyperlipidemia    Wears contact lenses    Wears dentures    upper full plate   Past Surgical History:  Procedure Laterality Date   ABDOMINAL HYSTERECTOMY     CERVICAL FUSION     C5 - C6 fusion   COLONOSCOPY     COLONOSCOPY WITH PROPOFOL N/A 12/11/2018   Procedure: COLONOSCOPY WITH PROPOFOL;  Surgeon: Midge Minium, MD;  [facility];   Service: Endoscopy;  Laterality: N/A;   TOENAIL EXCISION     Family History  Problem Relation Age of Onset   Healthy Sister    Heart disease Brother    Breast cancer Maternal Aunt    Social History   Socioeconomic History   Marital status: Divorced    Spouse name: Not on file   Number of children: 3   Years of education: Not on file   Highest education level: Some college, no degree  Occupational History   Occupation: retired  Tobacco Use   Smoking status: Every Day    Packs/day: 0.25    Years: 50.00    Additional pack years: 0.00    Total pack years: 12.50    Types: Cigarettes   Smokeless tobacco: Never   Tobacco comments:    2 cigarettes daily.  Vaping Use   Vaping Use: Never used  Substance and Sexual Activity   Alcohol use: Yes    Alcohol/week: 7.0 - 9.0 standard drinks of alcohol    Types: 7 - 9 Glasses of wine per week   Drug use: No   Sexual activity: Not on file  Other Topics Concern   Not on file  Social History Narrative   Not on file   Social Determinants of Health   Financial Resource Strain: Low Risk  (08/05/2022)   Overall Financial Resource Strain (CARDIA)    Difficulty of Paying Living Expenses: Not hard at all  Food Insecurity: No Food Insecurity (08/05/2022)   Hunger Vital Sign    Worried About Running Out of Food in the Last Year: Never true    Ran Out of Food in the Last Year: Never true  Transportation Needs: No Transportation Needs (08/05/2022)   PRAPARE - Administrator, Civil Service (Medical): No    Lack of Transportation (Non-Medical): No  Physical Activity: Sufficiently Active (08/05/2022)   Exercise Vital Sign    Days of Exercise per Week: 3 days    Minutes of Exercise per Session: 60 min  Stress: No Stress Concern Present (08/05/2022)   [employer] of Occupational Health - Occupational Stress Questionnaire    Feeling of Stress : Not at all  Social Connections: Moderately Isolated (08/05/2022)   Social Connection and  Isolation Panel [NHANES]    Frequency of Communication with Friends and Family: More than three times a week    Frequency of Social Gatherings with Friends and Family: More than three times a week    Attends Religious Services: More than 4 times per year    Active Member of [employer] or Organizations: No    Attends Banker Meetings: Never    Marital Status: Divorced    Tobacco Counseling Ready to  quit: Not Answered Counseling given: Not Answered Tobacco comments: 2 cigarettes daily.   Clinical Intake:  Pre-visit preparation completed: Yes  Pain : 0-10 Pain Score: 6  Pain Type: Chronic pain Pain Location: Shoulder Pain Orientation: Left Pain Descriptors / Indicators: Aching, Sharp Pain Onset: More than a month ago Pain Frequency: Constant Pain Relieving Factors: mobic, tylenol  Pain Relieving Factors: mobic, tylenol  BMI - recorded: 23.24 Nutritional Status: BMI of 19-24  Normal Nutritional Risks: None Diabetes: No  How often do you need to have someone help you when you read instructions, pamphlets, or other written materials from your doctor or pharmacy?: 1 - Never  Diabetic?no  Interpreter Needed?: No  Comments: lives with husband Information entered by :: B.Nastashia Gallo,LPN   Activities of Daily Living    08/05/2022    9:37 AM 06/18/2022    3:58 PM  In your present state of health, do you have any difficulty performing the following activities:  Hearing? 0 0  Vision? 0 0  Difficulty concentrating or making decisions? 0 0  Walking or climbing stairs? 0 0  Dressing or bathing? 0 0  Doing errands, shopping? 0 0  Preparing Food and eating ? N   Using the Toilet? N   In the past six months, have you accidently leaked urine? N   Do you have problems with loss of bowel control? N   Managing your Medications? N   Managing your Finances? N   Housekeeping or managing your Housekeeping? N     Patient Care Team: [name], FNP as PCP - General (Family  Medicine) Sherrie Mustache Demetrios Isaacs, MD as Referring Physician (Family Medicine) Verne Carrow, OD (Optometry) Felecia Shelling, DPM as Consulting Physician (Podiatry) Deeann Saint, MD (Orthopedic Surgery) Gaspar Cola, [facility] (Pharmacist)  Indicate any recent Medical Services you may have received from other than [facility] providers in the past year (date may be approximate).     Assessment:   This is a routine wellness examination for Melissa Buck.  Hearing/Vision screen Hearing Screening - Comments:: Adequate hearing Vision Screening - Comments:: Adequate vision after cataract surgery Dr Clydene Pugh  Dietary issues and exercise activities discussed: Current Exercise Habits: Home exercise routine, Type of exercise: treadmill;stretching;walking, Time (Minutes): 30, Frequency (Times/Week): 3, Weekly Exercise (Minutes/Week): 90, Intensity: Mild, Exercise limited by: orthopedic condition(s)   Goals Addressed             This Visit's Progress    Exercise 3x per week (30 min per time)   Not on track    Recommend to exercise for 3 days a week for at least 30 minutes at a time.      Manage My Emotions   On track    Timeframe:  Long-Range Goal Priority:  Medium Start Date:     11/30/20                        Expected End Date:  01/02/21                     Follow Up Date 01/02/21    - practice positive thinking and self-talk -continue with healthy diet and exercise -continue to take medications as prescribed  -contact CCM social worker with any additional mental health/community resource needs   Why is this important?   When you are stressed, down or upset, your body reacts too.  For example, your blood pressure may get higher; you may have a headache or  stomachache.  When your emotions get the best of you, your body's ability to fight off cold and flu gets weak.  These steps will help you manage your emotions.     Notes:      Quit Smoking   Not on track    Recommend to continue efforts to  reduce smoking habits until no longer smoking.        Depression Screen    08/05/2022    9:32 AM 06/18/2022    3:58 PM 05/29/2022    3:34 PM 05/07/2022    3:57 PM 03/15/2022    9:57 AM 12/14/2021    1:29 PM 08/01/2021    9:18 AM  PHQ 2/9 Scores  PHQ - 2 Score 0 0 2 0 1 0 0  PHQ- 9 Score  0 9 0 4 3     Fall Risk    08/05/2022    9:30 AM 06/18/2022    3:58 PM 05/29/2022    3:34 PM 05/07/2022    3:57 PM 03/15/2022    9:57 AM  Fall Risk   Falls in the past year? 0 1 1 1  0  Number falls in past yr: 0 0 0 0 0  Injury with Fall? 0 1 1 0 0  Risk for fall due to : No Fall Risks No Fall Risks History of fall(s) History of fall(s) No Fall Risks  Follow up Education provided;Falls prevention discussed  Falls evaluation completed Falls evaluation completed Falls evaluation completed    FALL RISK PREVENTION PERTAINING TO THE HOME:  Any stairs in or around the home? Yes  If so, are there any without handrails? Yes  Home free of loose throw rugs in walkways, pet beds, electrical cords, etc? Yes  Adequate lighting in your home to reduce risk of falls? Yes   ASSISTIVE DEVICES UTILIZED TO PREVENT FALLS:  Life alert? No  Use of a cane, walker or w/c? No  Grab bars in the bathroom? Yes  Shower chair or bench in shower? No  Elevated toilet seat or a handicapped toilet? Yes   Cognitive Function:        08/05/2022    9:41 AM 08/01/2021    9:24 AM 01/19/2019    3:44 PM  6CIT Screen  What Year? 0 points 0 points 0 points  What month? 0 points 0 points 0 points  What time? 0 points 0 points 0 points  Count back from 20 0 points 0 points 0 points  Months in reverse 0 points 0 points 0 points  Repeat phrase 0 points 0 points 0 points  Total Score 0 points 0 points 0 points    Immunizations Immunization History  Administered Date(s) Administered   Fluad Quad(high Dose 65+) 11/21/2020, 12/14/2021   Influenza, High Dose Seasonal PF 12/16/2017   PFIZER(Purple Top)SARS-COV-2 Vaccination  06/02/2019, 06/23/2019   Pneumococcal Conjugate-13 12/16/2017   Pneumococcal Polysaccharide-23 07/08/2019   Td 09/15/2003   Tdap 07/18/2016   Zoster Recombinat (Shingrix) 12/14/2021    TDAP status: Up to date  Flu Vaccine status: Up to date  Pneumococcal vaccine status: Up to date  Covid-19 vaccine status: Completed vaccines  Qualifies for Shingles Vaccine? Yes   Zostavax completed No   Shingrix Completed?: No.    Education has been provided regarding the importance of this vaccine. Patient has been advised to call insurance company to determine out of pocket expense if they have not yet received this vaccine. Advised may also receive vaccine at local pharmacy or  Health Dept. Verbalized acceptance and understanding.  Screening Tests Health Maintenance  Topic Date Due   COVID-19 Vaccine (3 - 2023-24 season) 11/02/2021   Zoster Vaccines- Shingrix (2 of 2) 02/08/2022   MAMMOGRAM  03/28/2022   INFLUENZA VACCINE  10/03/2022   Medicare Annual Wellness (AWV)  08/05/2023   Colonoscopy  12/11/2023   DEXA SCAN  02/08/2026   DTaP/Tdap/Td (3 - Td or Tdap) 07/19/2026   Pneumonia Vaccine 73+ Years old  Completed   Hepatitis C Screening  Completed   HPV VACCINES  Aged Out    Health Maintenance  Health Maintenance Due  Topic Date Due   COVID-19 Vaccine (3 - 2023-24 season) 11/02/2021   Zoster Vaccines- Shingrix (2 of 2) 02/08/2022   MAMMOGRAM  03/28/2022    Colorectal cancer screening: Type of screening: Colonoscopy. Completed yes. Repeat every 5 years  Mammogram status: Ordered yes. Pt provided with contact info and advised to call to schedule appt.   Bone Density status: Completed yes. Results reflect: Bone density results: NORMAL. Repeat every 5 years.  Lung Cancer Screening: (Low Dose CT Chest recommended if Age 47-80 years, 30 pack-year currently smoking OR have quit w/in 15years.) does qualify.   Lung Cancer Screening Referral: no  Additional Screening:  Hepatitis C  Screening: does not qualify; Completed yes  Vision Screening: Recommended annual ophthalmology exams for early detection of glaucoma and other disorders of the eye. Is the patient up to date with their annual eye exam?  Yes  Who is the provider or what is the name of the office in which the patient attends annual eye exams? Dr.Woodard If pt is not established with a provider, would they like to be referred to a provider to establish care? No .   Dental Screening: Recommended annual dental exams for proper oral hygiene  Community Resource Referral / Chronic Care Management: CRR required this visit?  No   CCM required this visit?  No    Plan:     I have personally reviewed and noted the following in the patient's chart:   Medical and social history Use of alcohol, tobacco or illicit drugs  Current medications and supplements including opioid prescriptions. Patient is not currently taking opioid prescriptions. Functional ability and status Nutritional status Physical activity Advanced directives List of other physicians Hospitalizations, surgeries, and ER visits in previous 12 months Vitals Screenings to include cognitive, depression, and falls Referrals and appointments  In addition, I have reviewed and discussed with patient certain preventive protocols, quality metrics, and best practice recommendations. A written personalized care plan for preventive services as well as general preventive health recommendations were provided to patient.     Sue Lush, LPN   03/09/1094   Nurse Notes: The patient states she is doing alright, she is managing shoulder pain. She is being treated by Ortho. She has no concerns or questions at this time.

## 2022-08-07 DIAGNOSIS — M7542 Impingement syndrome of left shoulder: Secondary | ICD-10-CM | POA: Diagnosis not present

## 2022-08-09 DIAGNOSIS — M75120 Complete rotator cuff tear or rupture of unspecified shoulder, not specified as traumatic: Secondary | ICD-10-CM

## 2022-08-09 DIAGNOSIS — M75122 Complete rotator cuff tear or rupture of left shoulder, not specified as traumatic: Secondary | ICD-10-CM | POA: Diagnosis not present

## 2022-08-09 HISTORY — DX: Complete rotator cuff tear or rupture of unspecified shoulder, not specified as traumatic: M75.120

## 2022-08-28 DIAGNOSIS — M25512 Pain in left shoulder: Secondary | ICD-10-CM

## 2022-08-28 DIAGNOSIS — M25612 Stiffness of left shoulder, not elsewhere classified: Secondary | ICD-10-CM

## 2022-08-28 HISTORY — DX: Pain in left shoulder: M25.512

## 2022-08-28 HISTORY — DX: Stiffness of left shoulder, not elsewhere classified: M25.612

## 2022-09-12 DIAGNOSIS — M25612 Stiffness of left shoulder, not elsewhere classified: Secondary | ICD-10-CM | POA: Diagnosis not present

## 2022-09-12 DIAGNOSIS — M25512 Pain in left shoulder: Secondary | ICD-10-CM | POA: Diagnosis not present

## 2022-09-19 DIAGNOSIS — M25612 Stiffness of left shoulder, not elsewhere classified: Secondary | ICD-10-CM | POA: Diagnosis not present

## 2022-09-19 DIAGNOSIS — M25512 Pain in left shoulder: Secondary | ICD-10-CM | POA: Diagnosis not present

## 2022-09-23 DIAGNOSIS — M25512 Pain in left shoulder: Secondary | ICD-10-CM | POA: Diagnosis not present

## 2022-09-24 DIAGNOSIS — M25612 Stiffness of left shoulder, not elsewhere classified: Secondary | ICD-10-CM | POA: Diagnosis not present

## 2022-09-24 DIAGNOSIS — M25512 Pain in left shoulder: Secondary | ICD-10-CM | POA: Diagnosis not present

## 2022-10-01 ENCOUNTER — Telehealth: Payer: Self-pay | Admitting: Family Medicine

## 2022-10-01 DIAGNOSIS — M25512 Pain in left shoulder: Secondary | ICD-10-CM | POA: Diagnosis not present

## 2022-10-01 DIAGNOSIS — M25612 Stiffness of left shoulder, not elsewhere classified: Secondary | ICD-10-CM | POA: Diagnosis not present

## 2022-10-22 ENCOUNTER — Other Ambulatory Visit: Payer: Self-pay | Admitting: Family Medicine

## 2022-10-22 DIAGNOSIS — Z1231 Encounter for screening mammogram for malignant neoplasm of breast: Secondary | ICD-10-CM

## 2022-10-28 ENCOUNTER — Other Ambulatory Visit: Payer: Self-pay | Admitting: Family Medicine

## 2022-10-28 DIAGNOSIS — F5101 Primary insomnia: Secondary | ICD-10-CM

## 2022-10-30 ENCOUNTER — Ambulatory Visit
Admission: RE | Admit: 2022-10-30 | Discharge: 2022-10-30 | Disposition: A | Discharge status: Home / Self Care | Payer: Medicare HMO | Source: Ambulatory Visit | Attending: Family Medicine | Admitting: Family Medicine

## 2022-10-30 DIAGNOSIS — Z1231 Encounter for screening mammogram for malignant neoplasm of breast: Secondary | ICD-10-CM | POA: Diagnosis not present

## 2022-11-07 DIAGNOSIS — H0288B Meibomian gland dysfunction left eye, upper and lower eyelids: Secondary | ICD-10-CM | POA: Diagnosis not present

## 2022-11-07 DIAGNOSIS — Z961 Presence of intraocular lens: Secondary | ICD-10-CM | POA: Diagnosis not present

## 2022-11-07 DIAGNOSIS — H538 Other visual disturbances: Secondary | ICD-10-CM | POA: Diagnosis not present

## 2022-11-07 DIAGNOSIS — H26493 Other secondary cataract, bilateral: Secondary | ICD-10-CM | POA: Diagnosis not present

## 2022-11-07 DIAGNOSIS — H04123 Dry eye syndrome of bilateral lacrimal glands: Secondary | ICD-10-CM | POA: Diagnosis not present

## 2022-11-07 DIAGNOSIS — H4423 Degenerative myopia, bilateral: Secondary | ICD-10-CM | POA: Diagnosis not present

## 2022-11-07 DIAGNOSIS — H40023 Open angle with borderline findings, high risk, bilateral: Secondary | ICD-10-CM | POA: Diagnosis not present

## 2022-11-07 DIAGNOSIS — H0288A Meibomian gland dysfunction right eye, upper and lower eyelids: Secondary | ICD-10-CM | POA: Diagnosis not present

## 2022-11-20 ENCOUNTER — Ambulatory Visit: Payer: Self-pay | Admitting: *Deleted

## 2022-11-20 NOTE — Telephone Encounter (Signed)
  Chief Complaint: Diarrhea for about 3 weeks and losing weight. Symptoms: Loose diarrhea, abd cramping with the diarrhea Frequency: For 3 weeks or so Pertinent Negatives: Patient denies N/A Disposition: [] ED /[] Urgent Care (no appt availability in office) / [x] Appointment(In office/virtual)/ []  [place] [facility]/ [] Home Care/ [] Refused Recommended Disposition /[] [place] [facility]/ []  Follow-up with PCP Additional Notes: Appt made for 11/21/2022 at 2:40 with Merita Norton, FNP

## 2022-11-20 NOTE — Telephone Encounter (Signed)
Message from [place] B sent at 11/20/2022  2:02 PM EDT  Summary: diarrhea   Pt called in, has diarrhea, for 3 or 4 weeks and losing weight even not eating much.          Call History  Contact Date/Time Type Contact Phone/Fax User  11/20/2022 02:01 PM EDT Phone (Incoming) Melissa Buck, Melissa Buck H "[name]" (Self)  Melissa Buck, Melissa Buck   Reason for Disposition  [1] MODERATE diarrhea (e.g., 4-6 times / day more than normal) AND [2] present > 48 hours (2 days)  Answer Assessment - Initial Assessment Questions 1. DIARRHEA SEVERITY: "How bad is the diarrhea?" "How many more stools have you had in the past 24 hours than normal?"    - NO DIARRHEA (SCALE 0)   - MILD (SCALE 1-3): Few loose or mushy BMs; increase of 1-3 stools over normal daily number of stools; mild increase in ostomy output.   -  MODERATE (SCALE 4-7): Increase of 4-6 stools daily over normal; moderate increase in ostomy output.   -  SEVERE (SCALE 8-10; OR "WORST POSSIBLE"): Increase of 7 or more stools daily over normal; moderate increase in ostomy output; incontinence.     I was diagnosed with colitis years ago.   I've been having diarrhea for the last 2-3 weeks.   I've cut way back on my alcohol.   Last night I had a small glass of wine and this morning I've been having diarrhea ever since I woke up this morning.    I don't know where this diarrhea is coming from.    2. ONSET: "When did the diarrhea begin?"      Since this morning.   But it's been going on for 2-3 weeks.    I drink a lot of water during the day. 3. BM CONSISTENCY: "How loose or watery is the diarrhea?"      Loose 4. VOMITING: "Are you also vomiting?" If Yes, ask: "How many times in the past 24 hours?"      No 5. ABDOMEN PAIN: "Are you having any abdomen pain?" If Yes, ask: "What does it feel like?" (e.g., crampy, dull, intermittent, constant)      I'm cramping. 6. ABDOMEN PAIN SEVERITY: If present, ask: "How bad is the pain?"  (e.g., Scale 1-10; mild, moderate, or  severe)   - MILD (1-3): doesn't interfere with normal activities, abdomen soft and not tender to touch    - MODERATE (4-7): interferes with normal activities or awakens from sleep, abdomen tender to touch    - SEVERE (8-10): excruciating pain, doubled over, unable to do any normal activities       Some abd pain with the diarrhea. 7. ORAL INTAKE: If vomiting, "Have you been able to drink liquids?" "How much liquids have you had in the past 24 hours?"     I'm drinking a lot of water. 8. HYDRATION: "Any signs of dehydration?" (e.g., dry mouth [not just dry lips], too weak to stand, dizziness, new weight loss) "When did you last urinate?"     I'm losing weight and I'm not eating much 9. EXPOSURE: "Have you traveled to a foreign country recently?" "Have you been exposed to anyone with diarrhea?" "Could you have eaten any food that was spoiled?"     Not asked 10. ANTIBIOTIC USE: "Are you taking antibiotics now or have you taken antibiotics in the past 2 months?"       Not asked 11. OTHER SYMPTOMS: "Do you have any other symptoms?" (e.g., fever,  blood in stool)       No 12. PREGNANCY: "Is there any chance you are pregnant?" "When was your last menstrual period?"       N/A due to age  Protocols used: [facility]

## 2022-11-21 ENCOUNTER — Ambulatory Visit (INDEPENDENT_AMBULATORY_CARE_PROVIDER_SITE_OTHER): Payer: Medicare HMO | Admitting: Family Medicine

## 2022-11-21 ENCOUNTER — Encounter: Payer: Self-pay | Admitting: Family Medicine

## 2022-11-21 VITALS — BP 106/68 | HR 66 | Ht 61.0 in | Wt 120.7 lb

## 2022-11-21 DIAGNOSIS — K529 Noninfective gastroenteritis and colitis, unspecified: Secondary | ICD-10-CM

## 2022-11-21 DIAGNOSIS — Z23 Encounter for immunization: Secondary | ICD-10-CM | POA: Diagnosis not present

## 2022-11-21 DIAGNOSIS — F39 Unspecified mood [affective] disorder: Secondary | ICD-10-CM | POA: Insufficient documentation

## 2022-11-21 DIAGNOSIS — Z720 Tobacco use: Secondary | ICD-10-CM

## 2022-11-21 DIAGNOSIS — F17200 Nicotine dependence, unspecified, uncomplicated: Secondary | ICD-10-CM

## 2022-11-21 NOTE — Patient Instructions (Signed)
The CDC recommends two doses of Shingrix (the new shingles vaccine) separated by 2 to 6 months for adults age 71 years and older. I recommend checking with your insurance plan regarding coverage for this vaccine.

## 2022-11-21 NOTE — Assessment & Plan Note (Signed)
Chronic, stable Remains precontemplative in regards to cessation efforts

## 2022-11-21 NOTE — Progress Notes (Signed)
Established patient visit   Patient: Melissa Buck   DOB: 1950/07/02   72 y.o. Female  MRN: [id number] Visit Date: 11/21/2022  Today's healthcare provider: [name], FNP  Introduced to nurse practitioner role and practice setting.  All questions answered.  Discussed provider/patient relationship and expectations.  Subjective    Diarrhea    HPI     Diarrhea    Additional comments: Present X 3 weeks. Assocaited with cramping and weight loss. Previous dx of colitis a few years ago and patient reports she has made changes since then by cutting way back on her alcohol intake.  She reports last night she had a small glass of wine and this morning she has been having diarrhea ever since she woke up this morning.       Last edited by Acey Lav, CMA on 11/21/2022  2:33 PM.      Medications: Outpatient Medications Prior to Visit  Medication Sig   albuterol (VENTOLIN HFA) 108 (90 Base) MCG/ACT inhaler Inhale 2 puffs into the lungs every 6 (six) hours as needed for wheezing or shortness of breath.   azelastine (ASTELIN) 0.1 % nasal spray Place 2 sprays into both nostrils 2 (two) times daily. Use in each nostril as directed   busPIRone (BUSPAR) 5 MG tablet Take 1 tablet (5 mg total) by mouth 3 (three) times daily as needed.   cetirizine (ZYRTEC) 10 MG tablet TAKE 1 TABLET (10 MG TOTAL) BY MOUTH DAILY.   Cholecalciferol (VITAMIN D3) 125 MCG (5000 UT) CHEW Chew 5,000 Units by mouth daily.   escitalopram (LEXAPRO) 10 MG tablet Take 1 tablet (10 mg total) by mouth at bedtime.   MAGNESIUM PO Take by mouth.   meloxicam (MOBIC) 15 MG tablet TAKE 1 TABLET EVERY DAY AS NEEDED FOR PAIN   methocarbamol (ROBAXIN) 500 MG tablet    metoCLOPramide (REGLAN) 10 MG tablet Take 1 tablet (10 mg total) by mouth 3 (three) times daily as needed for nausea.   Multiple Vitamin (MULTIVITAMIN) tablet Take 1 tablet by mouth daily.   omeprazole (PRILOSEC) 40 MG capsule TAKE 1 CAPSULE EVERY DAY    Probiotic Product (PROBIOTIC ADVANCED PO) Take 1 capsule by mouth.   simvastatin (ZOCOR) 20 MG tablet Take 1 tablet (20 mg total) by mouth at bedtime.   tiZANidine (ZANAFLEX) 4 MG tablet Take 1 tablet (4 mg total) by mouth every 4 (four) hours as needed (headache). (Patient taking differently: Take 4 mg by mouth every 4 (four) hours as needed (migraine).)   traZODone (DESYREL) 50 MG tablet TAKE 1 TABLET AT BEDTIME AS NEEDED FOR SLEEP   vitamin B-12 (CYANOCOBALAMIN) 1000 MCG tablet Take 1,000 mcg by mouth daily.   No facility-administered medications prior to visit.     Objective    BP 106/68   Pulse 66   Ht 5\' 1"  (1.549 m)   Wt 120 lb 11.2 oz (54.7 kg)   BMI 22.81 kg/m   Physical Exam Vitals and nursing note reviewed.  Constitutional:      General: She is not in acute distress.    Appearance: Normal appearance. She is normal weight. She is not ill-appearing, toxic-appearing or diaphoretic.  HENT:     Head: Normocephalic and atraumatic.  Cardiovascular:     Rate and Rhythm: Normal rate and regular rhythm.     Pulses: Normal pulses.     Heart sounds: Normal heart sounds. No murmur heard.    No friction rub. No gallop.  Pulmonary:  Effort: Pulmonary effort is normal. No respiratory distress.     Breath sounds: Normal breath sounds. No stridor. No wheezing, rhonchi or rales.  Chest:     Chest wall: No tenderness.  Abdominal:     General: Bowel sounds are normal. There is no distension.     Palpations: Abdomen is soft. There is no mass.     Tenderness: There is no abdominal tenderness. There is no right CVA tenderness, left CVA tenderness, guarding or rebound.     Hernia: No hernia is present.  Musculoskeletal:        General: No swelling, tenderness, deformity or signs of injury. Normal range of motion.     Right lower leg: No edema.     Left lower leg: No edema.  Skin:    General: Skin is warm and dry.     Capillary Refill: Capillary refill takes less than 2 seconds.      Coloration: Skin is not jaundiced or pale.     Findings: No bruising, erythema, lesion or rash.  Neurological:     General: No focal deficit present.     Mental Status: She is alert and oriented to person, place, and time. Mental status is at baseline.     Cranial Nerves: No cranial nerve deficit.     Sensory: No sensory deficit.     Motor: No weakness.     Coordination: Coordination normal.  Psychiatric:        Mood and Affect: Mood normal.        Behavior: Behavior normal.        Thought Content: Thought content normal.        Judgment: Judgment normal.     No results found for any visits on 11/21/22.  Assessment & Plan     Problem List Items Addressed This Visit       Digestive   Diarrhea due to alcohol intake - Primary    Acute on chronic, in setting of alcohol reduction Pt continues to be able to eat, drink, and perform normal activities Well appearing; benign abdominal exam Recommend bland diet and monitoring Referral to gastro for additional work up if necessary       Relevant Orders   Ambulatory referral to Gastroenterology     Other   Current tobacco use    Chronic, stable Remains precontemplative in regards to cessation efforts      Mood disorder (HCC)    Chronic, stable depression and resolved anxiety per pt reports Has had discussions with previous husband, now roommate and owner of her rental regarding his sexual behavior Continue to monitor       Need for immunization against influenza   Tobacco dependence    Chronic, stable Remains precontemplative in regards to cessation efforts      Return if symptoms worsen or fail to improve.     Leilani Merl, FNP, have reviewed all documentation for this visit. The documentation on 11/21/22 for the exam, diagnosis, procedures, and orders are all accurate and complete.  [name], FNP  [facility] [phone] (phone) [phone] (fax)  [facility]

## 2022-11-21 NOTE — Assessment & Plan Note (Signed)
Chronic, stable depression and resolved anxiety per pt reports Has had discussions with previous husband, now roommate and owner of her rental regarding his sexual behavior Continue to monitor

## 2022-11-21 NOTE — Assessment & Plan Note (Signed)
Acute on chronic, in setting of alcohol reduction Pt continues to be able to eat, drink, and perform normal activities Well appearing; benign abdominal exam Recommend bland diet and monitoring Referral to gastro for additional work up if necessary

## 2022-11-27 ENCOUNTER — Telehealth: Payer: Self-pay | Admitting: Family Medicine

## 2022-11-27 ENCOUNTER — Other Ambulatory Visit: Payer: Self-pay | Admitting: Family Medicine

## 2022-11-27 MED ORDER — METHOCARBAMOL 500 MG PO TABS: 500.0000 mg | ORAL_TABLET | Freq: Every day | ORAL | 0 refills | Status: DC | PRN

## 2022-11-27 NOTE — Telephone Encounter (Signed)
Historical provider.

## 2022-11-27 NOTE — Telephone Encounter (Signed)
[facility] faxed refill request for the following medications:   methocarbamol (ROBAXIN) 500 MG tablet    Please advise.

## 2022-12-02 NOTE — Addendum Note (Signed)
Addended by: Lily Kocher on: 12/02/2022 11:50 AM   Modules accepted: Orders

## 2022-12-13 ENCOUNTER — Telehealth: Payer: Self-pay | Admitting: Physician Assistant

## 2022-12-13 NOTE — Telephone Encounter (Signed)
Patient called in stating that Melissa Buck called her and she don't know why no one left a voicemail.

## 2022-12-13 NOTE — Telephone Encounter (Signed)
I had not called patient. I return patient call and she said she did not have no questions though

## 2023-01-01 NOTE — Progress Notes (Signed)
Celso Amy, PA-C [address]  [address]  [place], [place] [address]  Main: [phone]  Fax: [phone]   Gastroenterology Consultation  Referring Provider:     [name], FNP Primary Care Physician:  [name], FNP Primary Gastroenterologist:  Celso Amy, PA-C / Dr. Midge Minium   Reason for Consultation:     Diarrhea        HPI:   Melissa Buck is a 72 y.o. y/o female referred for consultation & management  by [name], FNP.    Patient is referred to evaluate diarrhea.  She drinks alcohol 2-3 times per week.  Current tobacco dependence.  Patient states she has history of collagenous colitis many years ago, records are not available.  Patient states she has had diarrhea for 2 or 3 months.  She is having 2 or 3 loose stools every day.  She denies rectal bleeding, nausea, or vomiting.  She has lost 20 pounds in the past few months.  She states she has been trying to lose weight.  Currently taking magnesium supplement.  She still has her gallbladder.  She has not noticed any specific food triggers.  She notes occasional mild abdominal cramping prior to diarrhea.  Denies any recent antibiotics, travel, or sick exposures.  She has personal history of colon polyps.  Last colonoscopy 12/2018 by Dr. Servando Snare showed nonbleeding internal hemorrhoids, otherwise normal.  No polyps.  5-year repeat (12/2023).  She also had colonoscopies in 2015, 2010, and 2008.  No previous EGD.  Last labs 03/2022: Normal CBC and CMP.  No stool studies.  Past Medical History:  Diagnosis Date   Arthritis    knees, ankles,    Asthma    Colitis    COPD (chronic obstructive pulmonary disease) (HCC)    Full thickness rotator cuff tear 08/09/2022   GERD (gastroesophageal reflux disease)    Headache    history of migraines   Hyperlipidemia    Pain in joint of left shoulder 08/28/2022   Stiffness of left shoulder joint 08/28/2022   Wears contact lenses    Wears dentures    upper full  plate    Past Surgical History:  Procedure Laterality Date   ABDOMINAL HYSTERECTOMY     CERVICAL FUSION     C5 - C6 fusion   COLONOSCOPY     COLONOSCOPY WITH PROPOFOL N/A 12/11/2018   Procedure: COLONOSCOPY WITH PROPOFOL;  Surgeon: Midge Minium, MD;  [facility];  Service: Endoscopy;  Laterality: N/A;   TOENAIL EXCISION      Prior to Admission medications   Medication Sig Start Date End Date Taking? Authorizing Provider  albuterol (VENTOLIN HFA) 108 (90 Base) MCG/ACT inhaler Inhale 2 puffs into the lungs every 6 (six) hours as needed for wheezing or shortness of breath. 07/09/22   [name], FNP  azelastine (ASTELIN) 0.1 % nasal spray Place 2 sprays into both nostrils 2 (two) times daily. Use in each nostril as directed 07/09/22   [name], FNP  busPIRone (BUSPAR) 5 MG tablet Take 1 tablet (5 mg total) by mouth 3 (three) times daily as needed. 05/29/22   [name], FNP  cetirizine (ZYRTEC) 10 MG tablet TAKE 1 TABLET (10 MG TOTAL) BY MOUTH DAILY. 05/07/22   [name], FNP  Cholecalciferol (VITAMIN D3) 125 MCG (5000 UT) CHEW Chew 5,000 Units by mouth daily.    [provider]  escitalopram (LEXAPRO) 10 MG tablet Take 1  tablet (10 mg total) by mouth at bedtime. 06/18/22   [name], FNP  MAGNESIUM PO Take by mouth.    [provider]  meloxicam (MOBIC) 15 MG tablet TAKE 1 TABLET EVERY DAY AS NEEDED FOR PAIN 05/07/22   [name], FNP  methocarbamol (ROBAXIN) 500 MG tablet Take 1 tablet (500 mg total) by mouth daily as needed for muscle spasms. 11/27/22   [name], FNP  metoCLOPramide (REGLAN) 10 MG tablet Take 1 tablet (10 mg total) by mouth 3 (three) times daily as needed for nausea. 11/21/20   [name], FNP  Multiple Vitamin (MULTIVITAMIN) tablet Take 1 tablet by mouth daily.    [provider]  omeprazole (PRILOSEC) 40 MG capsule TAKE 1 CAPSULE EVERY DAY 12/12/21   Merita Norton T, FNP  Probiotic Product (PROBIOTIC  ADVANCED PO) Take 1 capsule by mouth.    [provider]  simvastatin (ZOCOR) 20 MG tablet Take 1 tablet (20 mg total) by mouth at bedtime. 05/07/22   [name], FNP  tiZANidine (ZANAFLEX) 4 MG tablet Take 1 tablet (4 mg total) by mouth every 4 (four) hours as needed (headache). Patient taking differently: Take 4 mg by mouth every 4 (four) hours as needed (migraine). 11/21/20   [name], FNP  traZODone (DESYREL) 50 MG tablet TAKE 1 TABLET AT BEDTIME AS NEEDED FOR SLEEP 10/29/22   [name], FNP  vitamin B-12 (CYANOCOBALAMIN) 1000 MCG tablet Take 1,000 mcg by mouth daily.    [provider]    Family History  Problem Relation Age of Onset   Healthy Sister    Heart disease Brother    Breast cancer Maternal Aunt      Social History   Tobacco Use   Smoking status: Every Day    Current packs/day: 0.25    Average packs/day: 0.3 packs/day for 50.0 years (12.5 ttl pk-yrs)    Types: Cigarettes   Smokeless tobacco: Never   Tobacco comments:    2 cigarettes daily.  Vaping Use   Vaping status: Never Used  Substance Use Topics   Alcohol use: Yes    Alcohol/week: 7.0 - 9.0 standard drinks of alcohol    Types: 7 - 9 Glasses of wine per week   Drug use: No    Allergies as of 01/02/2023 - Review Complete 01/02/2023  Allergen Reaction Noted   Aspirin  03/10/2015   Erythromycin  03/10/2015   Hydrocodone-acetaminophen Itching 03/10/2015   Sulfa antibiotics  03/10/2015    Review of Systems:    All systems reviewed and negative except where noted in HPI.   Physical Exam:  BP 100/61   Pulse (!) 40   Temp 98.4 F (36.9 C)   Ht 5\' 1"  (1.549 m)   Wt 119 lb 12.8 oz (54.3 kg)   BMI 22.64 kg/m  No LMP recorded. Patient has had a hysterectomy.  General:   Alert,  Well-developed, well-nourished, pleasant and cooperative in NAD Lungs:  Respirations even and unlabored.  Clear throughout to auscultation.   No wheezes, crackles, or rhonchi. No acute  distress. Heart:  Regular rate and rhythm; no murmurs, clicks, rubs, or gallops. Abdomen:  Normal bowel sounds.  No bruits.  Soft, and non-distended without masses, hepatosplenomegaly or hernias noted.  Mild Left sided Tenderness.  Rest of abdomen is not tender.  No guarding or rebound tenderness.    Neurologic:  Alert and oriented x3;  grossly normal neurologically. Psych:  Alert and cooperative.  Normal mood and affect.  Imaging Studies: No results found.  Assessment and Plan:   ZANNA ELS is a 72 y.o. y/o female has been referred for evaluation of diarrhea for 2 to 3 months.  Differential includes microscopic (collagenous) colitis, adverse side effect of medication, alcohol induced, infectious, celiac, EPI.  I am starting her workup with labs and stool studies.  Pending results, consider repeat colonoscopy with biopsies versus treatment with budesonide.  1.  Diarrhea  Labs: TSH, CBC, CMP, celiac panel  Stool Studies: GI Pathogen Panal, C. Difficile Toxin PCR, Fecal Calprotectin, fecal pancreatic elastase.  Stop magnesium supplement which can cause loose stools.  Limit alcohol use.  Continue drinking 64 ounces of fluids daily with electrolytes such as Gatorade and Powerade.  If stool studies are negative, then she can take Imodium as needed.  2.  Weight loss  Labs: TSH, CBC, CMP, celiac panel  3.  History of collagenous colitis  If diarrhea persist and above stool test and labs are unrevealing, then consider repeat colonoscopy with biopsies versus trial of treatment with budesonide.  4.  History of colon polyps  5-year repeat colonoscopy will be due 12/2023  Follow up in 3 weeks with TG.  Celso Amy, PA-C

## 2023-01-02 ENCOUNTER — Ambulatory Visit (INDEPENDENT_AMBULATORY_CARE_PROVIDER_SITE_OTHER): Payer: Medicare HMO | Admitting: Physician Assistant

## 2023-01-02 ENCOUNTER — Encounter: Payer: Self-pay | Admitting: Physician Assistant

## 2023-01-02 VITALS — BP 100/61 | HR 40 | Temp 98.4°F | Ht 61.0 in | Wt 119.8 lb

## 2023-01-02 DIAGNOSIS — R634 Abnormal weight loss: Secondary | ICD-10-CM

## 2023-01-02 DIAGNOSIS — Z860100 Personal history of colon polyps, unspecified: Secondary | ICD-10-CM

## 2023-01-02 DIAGNOSIS — R197 Diarrhea, unspecified: Secondary | ICD-10-CM | POA: Diagnosis not present

## 2023-01-02 DIAGNOSIS — Z8719 Personal history of other diseases of the digestive system: Secondary | ICD-10-CM | POA: Diagnosis not present

## 2023-01-02 NOTE — Patient Instructions (Addendum)
Please stop taking Magnesium supplement.  Continue drinking 64 ounces of fluids daily with electrolytes such as Gatorade and Powerade.  Please limit alcohol, high fructose corn syrup, and sugar alcohols such as sorbitol and manitol which can cause diarrhea.

## 2023-01-05 DIAGNOSIS — R197 Diarrhea, unspecified: Secondary | ICD-10-CM | POA: Diagnosis not present

## 2023-01-05 LAB — CBC WITH DIFFERENTIAL/PLATELET
Basophils Absolute: 0.1 10*3/uL (ref 0.0–0.2)
Basos: 1 %
EOS (ABSOLUTE): 0.1 10*3/uL (ref 0.0–0.4)
Eos: 2 %
Hematocrit: 39.5 % (ref 34.0–46.6)
Hemoglobin: 13.1 g/dL (ref 11.1–15.9)
Immature Grans (Abs): 0 10*3/uL (ref 0.0–0.1)
Immature Granulocytes: 0 %
Lymphocytes Absolute: 1.3 10*3/uL (ref 0.7–3.1)
Lymphs: 19 %
MCH: 32.2 pg (ref 26.6–33.0)
MCHC: 33.2 g/dL (ref 31.5–35.7)
MCV: 97 fL (ref 79–97)
Monocytes Absolute: 0.4 10*3/uL (ref 0.1–0.9)
Monocytes: 6 %
Neutrophils Absolute: 4.8 10*3/uL (ref 1.4–7.0)
Neutrophils: 72 %
Platelets: 322 10*3/uL (ref 150–450)
RBC: 4.07 x10E6/uL (ref 3.77–5.28)
RDW: 12.4 % (ref 11.7–15.4)
WBC: 6.8 10*3/uL (ref 3.4–10.8)

## 2023-01-05 LAB — CELIAC DISEASE AB SCREEN W/RFX
Antigliadin Abs, IgA: 3 U (ref 0–19)
IgA/Immunoglobulin A, Serum: 152 mg/dL (ref 87–352)
Transglutaminase IgA: <2 U/mL (ref 0–3)

## 2023-01-05 LAB — COMPREHENSIVE METABOLIC PANEL
ALT: 17 [IU]/L (ref 0–32)
AST: 25 IU/L (ref 0–40)
Albumin: 4.6 g/dL (ref 3.9–4.9)
Alkaline Phosphatase: 114 [IU]/L (ref 44–121)
BUN/Creatinine Ratio: 16 (ref 12–28)
BUN: 13 mg/dL (ref 8–27)
Bilirubin Total: 0.6 mg/dL (ref 0.0–1.2)
CO2: 22 mmol/L (ref 20–29)
Calcium: 9.4 mg/dL (ref 8.7–10.3)
Chloride: 94 mmol/L — ABNORMAL LOW (ref 96–106)
Creatinine, Ser: 0.79 mg/dL (ref 0.57–1.00)
Globulin, Total: 2.6 g/dL (ref 1.5–4.5)
Glucose: 71 mg/dL (ref 70–99)
Potassium: 5.2 mmol/L (ref 3.5–5.2)
Sodium: 134 mmol/L (ref 134–144)
Total Protein: 7.2 g/dL (ref 6.0–8.5)
eGFR: 80 mL/min/{1.73_m2} (ref >59)

## 2023-01-05 LAB — TSH: TSH: 1.04 u[IU]/mL (ref 0.450–4.500)

## 2023-01-06 NOTE — Progress Notes (Signed)
Please call and notify patient labs show glucose, kidney test, liver test, electrolytes, thyroid test, white count, and hemoglobin.  Celiac labs are negative.  No evidence of celiac disease or gluten allergy..  Labs look great!  Continue with plan to do stool studies that were ordered.

## 2023-01-07 ENCOUNTER — Telehealth: Payer: Self-pay

## 2023-01-07 NOTE — Telephone Encounter (Signed)
Patient notified and stated she will get the stool test done.Please call and notify patient labs show glucose, kidney test, liver test, electrolytes, thyroid test, white count, and hemoglobin.  Celiac labs are negative.  No evidence of celiac disease or gluten allergy..  Labs look great!  Continue with plan to do stool studies that were ordered.

## 2023-01-08 LAB — CLOSTRIDIUM DIFFICILE BY PCR: Toxigenic C. Difficile by PCR: NEGATIVE

## 2023-01-09 ENCOUNTER — Other Ambulatory Visit: Payer: Self-pay

## 2023-01-09 ENCOUNTER — Telehealth: Payer: Self-pay

## 2023-01-09 DIAGNOSIS — R197 Diarrhea, unspecified: Secondary | ICD-10-CM

## 2023-01-09 LAB — GI PROFILE, STOOL, PCR

## 2023-01-09 LAB — PANCREATIC ELASTASE, FECAL: Pancreatic Elastase, Fecal: 342 ug Elast./g (ref >200)

## 2023-01-09 LAB — CALPROTECTIN, FECAL: Calprotectin, Fecal: 540 ug/g — ABNORMAL HIGH (ref 0–120)

## 2023-01-09 MED ORDER — PEG 3350-KCL-NA BICARB-NACL 420 G PO SOLR: 4000.0000 mL | Freq: Once | ORAL | 0 refills | Status: AC

## 2023-01-09 NOTE — Progress Notes (Signed)
Call and notify patient stool studies show: 1.  GI pathogen panel and C. difficile stool tests are negative.  No evidence of infection to cause diarrhea. 2.  Fecal pancreatic elastase is normal.  No evidence of pancreatic insufficiency. 3.  Fecal calprotectin is very elevated.  This is consistent with inflammation in the intestines.  Please schedule colonoscopy ASAP with Dr. Servando Snare to evaluate for inflammatory colitis.  Diagnosis: diarrhea and elevated fecal calprotectin. Celso Amy, PA-C

## 2023-01-09 NOTE — Telephone Encounter (Signed)
Colonopscopy scheduled 11/25 with Dr.Wohl [place]ane -patient notified.  Call and notify patient stool studies show:  1.  GI pathogen panel and C. difficile stool tests are negative.  No evidence of infection to cause diarrhea.  2.  Fecal pancreatic elastase is normal.  No evidence of pancreatic insufficiency.  3.  Fecal calprotectin is very elevated.  This is consistent with inflammation in the intestines.  Please schedule colonoscopy ASAP with Dr. Servando Snare to evaluate for inflammatory colitis.  Diagnosis: diarrhea and elevated fecal calprotectin.  Celso Amy, PA-C

## 2023-01-23 ENCOUNTER — Encounter: Payer: Self-pay | Admitting: Gastroenterology

## 2023-01-27 ENCOUNTER — Encounter
Admission: RE | Disposition: A | Discharge status: Home / Self Care | Payer: Self-pay | Source: Home / Self Care | Attending: Gastroenterology

## 2023-01-27 ENCOUNTER — Ambulatory Visit: Payer: Medicare HMO | Admitting: Anesthesiology

## 2023-01-27 ENCOUNTER — Other Ambulatory Visit: Payer: Self-pay

## 2023-01-27 ENCOUNTER — Encounter: Payer: Self-pay | Admitting: Gastroenterology

## 2023-01-27 ENCOUNTER — Ambulatory Visit
Admission: RE | Admit: 2023-01-27 | Discharge: 2023-01-27 | Disposition: A | Discharge status: Home / Self Care | Payer: Medicare HMO | Attending: Gastroenterology | Admitting: Gastroenterology

## 2023-01-27 DIAGNOSIS — K219 Gastro-esophageal reflux disease without esophagitis: Secondary | ICD-10-CM | POA: Diagnosis not present

## 2023-01-27 DIAGNOSIS — F32A Depression, unspecified: Secondary | ICD-10-CM | POA: Insufficient documentation

## 2023-01-27 DIAGNOSIS — R197 Diarrhea, unspecified: Secondary | ICD-10-CM | POA: Diagnosis not present

## 2023-01-27 DIAGNOSIS — K529 Noninfective gastroenteritis and colitis, unspecified: Secondary | ICD-10-CM | POA: Diagnosis not present

## 2023-01-27 DIAGNOSIS — F17210 Nicotine dependence, cigarettes, uncomplicated: Secondary | ICD-10-CM | POA: Insufficient documentation

## 2023-01-27 DIAGNOSIS — J449 Chronic obstructive pulmonary disease, unspecified: Secondary | ICD-10-CM | POA: Diagnosis not present

## 2023-01-27 DIAGNOSIS — K641 Second degree hemorrhoids: Secondary | ICD-10-CM | POA: Diagnosis not present

## 2023-01-27 DIAGNOSIS — K639 Disease of intestine, unspecified: Secondary | ICD-10-CM | POA: Diagnosis not present

## 2023-01-27 DIAGNOSIS — J4489 Other specified chronic obstructive pulmonary disease: Secondary | ICD-10-CM | POA: Insufficient documentation

## 2023-01-27 DIAGNOSIS — R195 Other fecal abnormalities: Secondary | ICD-10-CM | POA: Diagnosis not present

## 2023-01-27 DIAGNOSIS — K52831 Collagenous colitis: Secondary | ICD-10-CM

## 2023-01-27 HISTORY — PX: COLONOSCOPY WITH PROPOFOL: SHX5780

## 2023-01-27 LAB — BASIC METABOLIC PANEL
Anion gap: 6 (ref 5–15)
BUN: <5 mg/dL — ABNORMAL LOW (ref 8–23)
CO2: 26 mmol/L (ref 22–32)
Calcium: 9.2 mg/dL (ref 8.9–10.3)
Chloride: 101 mmol/L (ref 98–111)
Creatinine, Ser: 0.7 mg/dL (ref 0.44–1.00)
GFR, Estimated: >60 mL/min (ref >60)
Glucose, Bld: 87 mg/dL (ref 70–99)
Potassium: 3.6 mmol/L (ref 3.5–5.1)
Sodium: 133 mmol/L — ABNORMAL LOW (ref 135–145)

## 2023-01-27 SURGERY — COLONOSCOPY WITH PROPOFOL
Anesthesia: General

## 2023-01-27 MED ORDER — LACTATED RINGERS IV SOLN: INTRAVENOUS | Status: DC

## 2023-01-27 MED ORDER — SODIUM CHLORIDE 0.9 % IV SOLN: INTRAVENOUS | Status: DC

## 2023-01-27 MED ORDER — PROPOFOL 10 MG/ML IV BOLUS
INTRAVENOUS | Status: AC
  Filled 2023-01-27: qty 20

## 2023-01-27 MED ORDER — LIDOCAINE HCL (CARDIAC) PF 100 MG/5ML IV SOSY
PREFILLED_SYRINGE | INTRAVENOUS | Status: DC | PRN
  Administered 2023-01-27: 60 mg via INTRAVENOUS

## 2023-01-27 MED ORDER — PROPOFOL 10 MG/ML IV BOLUS
INTRAVENOUS | Status: DC | PRN
  Administered 2023-01-27 (×2): 90 mg via INTRAVENOUS

## 2023-01-27 MED ORDER — STERILE WATER FOR IRRIGATION IR SOLN
Status: DC | PRN
  Administered 2023-01-27: 1

## 2023-01-27 SURGICAL SUPPLY — 5 items
FORCEPS BIOP RAD 4 LRG CAP 4 (CUTTING FORCEPS) IMPLANT
GOWN CVR UNV OPN BCK APRN NK (MISCELLANEOUS) ×2 IMPLANT
KIT PRC NS LF DISP ENDO (KITS) ×1 IMPLANT
MANIFOLD NEPTUNE II (INSTRUMENTS) ×1 IMPLANT
WATER STERILE IRR 250ML POUR (IV SOLUTION) ×1 IMPLANT

## 2023-01-27 NOTE — Anesthesia Postprocedure Evaluation (Signed)
Anesthesia Post Note  Patient: Melissa Buck  Procedure(s) Performed: COLONOSCOPY WITH PROPOFOL  Patient location during evaluation: PACU Anesthesia Type: General Level of consciousness: awake and alert Pain management: pain level controlled Vital Signs Assessment: post-procedure vital signs reviewed and stable Respiratory status: spontaneous breathing, nonlabored ventilation, respiratory function stable and patient connected to nasal cannula oxygen Cardiovascular status: blood pressure returned to baseline and stable Postop Assessment: no apparent nausea or vomiting Anesthetic complications: no   No notable events documented.   Last Vitals:  Vitals:   01/27/23 1155 01/27/23 1200  BP: 106/72   Pulse: 66   Resp: (!) 22 15  Temp:    SpO2: 95%     Last Pain:  Vitals:   01/27/23 1155  TempSrc:   PainSc: 0-No pain                 Corrie Brannen C Adir Schicker

## 2023-01-27 NOTE — Transfer of Care (Signed)
Immediate Anesthesia Transfer of Care Note  Patient: Melissa Buck  Procedure(s) Performed: COLONOSCOPY WITH PROPOFOL  Patient Location: PACU  Anesthesia Type: General  Level of Consciousness: awake, alert  and patient cooperative  Airway and Oxygen Therapy: Patient Spontanous Breathing and Patient connected to supplemental oxygen  Post-op Assessment: Post-op Vital signs reviewed, Patient's Cardiovascular Status Stable, Respiratory Function Stable, Patent Airway and No signs of Nausea or vomiting  Post-op Vital Signs: Reviewed and stable  Complications: No notable events documented.

## 2023-01-27 NOTE — Anesthesia Preprocedure Evaluation (Addendum)
Anesthesia Evaluation  Patient identified by MRN, date of birth, ID band Patient awake    Reviewed: Allergy & Precautions, H&P , NPO status , Patient's Chart, lab work & pertinent test results  Airway Mallampati: III  TM Distance: >3 FB Neck ROM: Full    Dental no notable dental hx.    Pulmonary neg pulmonary ROS, asthma , COPD, Current Smoker and Patient abstained from smoking.   Pulmonary exam normal breath sounds clear to auscultation       Cardiovascular negative cardio ROS Normal cardiovascular exam Rhythm:Irregular Rate:Normal  EKG presently showing bigeminy. Since patient has had colonoscopy prep with resultant normal reaction of diarrhea, and a hx (at least once) that I can see of hypokalemia, and since hypokalemia can cause bigeminy, will obtain BMP preop.     Neuro/Psych  Headaches PSYCHIATRIC DISORDERS  Depression    negative neurological ROS  negative psych ROS   GI/Hepatic negative GI ROS, Neg liver ROS,GERD  ,,  Endo/Other  negative endocrine ROS    Renal/GU negative Renal ROS  negative genitourinary   Musculoskeletal negative musculoskeletal ROS (+) Arthritis ,    Abdominal   Peds negative pediatric ROS (+)  Hematology negative hematology ROS (+)   Anesthesia Other Findings Asthma COPD (chronic obstructive pulmonary disease)   Colitis  Arthritis Headache  Wears dentures GERD (gastroesophageal reflux disease) Hyperlipidemia Stiffness of left shoulder joint Full thickness rotator cuff tear Pain in joint of left shoulder      Reproductive/Obstetrics negative OB ROS                             Anesthesia Physical Anesthesia Plan  ASA: 3  Anesthesia Plan: General   Post-op Pain Management:    Induction: Intravenous  PONV Risk Score and Plan:   Airway Management Planned: Natural Airway and Nasal Cannula  Additional Equipment:   Intra-op Plan:   Post-operative  Plan:   Informed Consent: I have reviewed the patients History and Physical, chart, labs and discussed the procedure including the risks, benefits and alternatives for the proposed anesthesia with the patient or authorized representative who has indicated his/her understanding and acceptance.     Dental Advisory Given  Plan Discussed with: Anesthesiologist, CRNA and Surgeon  Anesthesia Plan Comments: (Patient consented for risks of anesthesia including but not limited to:  - adverse reactions to medications - risk of airway placement if required - damage to eyes, teeth, lips or other oral mucosa - nerve damage due to positioning  - sore throat or hoarseness - Damage to heart, brain, nerves, lungs, other parts of body or loss of life  Patient voiced understanding and assent.)        Anesthesia Quick Evaluation

## 2023-01-27 NOTE — H&P (Signed)
Midge Minium, MD [facility] [address], [address] [place], [place] [address] Phone:[phone] Fax : [phone]  Primary Care Physician:  [name], FNP Primary Gastroenterologist:  Dr. Servando Snare  Pre-Procedure History & Physical: HPI:  Melissa Buck is a 72 y.o. female is here for an colonoscopy.   Past Medical History:  Diagnosis Date   Arthritis    knees, ankles,    Asthma    Colitis    COPD (chronic obstructive pulmonary disease) (HCC)    Full thickness rotator cuff tear 08/09/2022   GERD (gastroesophageal reflux disease)    Headache    history of migraines   Hyperlipidemia    Pain in joint of left shoulder 08/28/2022   Stiffness of left shoulder joint 08/28/2022   Wears dentures    upper full plate    Past Surgical History:  Procedure Laterality Date   ABDOMINAL HYSTERECTOMY     CATARACT EXTRACTION W/ INTRAOCULAR LENS IMPLANT Bilateral    CERVICAL FUSION     C5 - C6 fusion   COLONOSCOPY     COLONOSCOPY WITH PROPOFOL N/A 12/11/2018   Procedure: COLONOSCOPY WITH PROPOFOL;  Surgeon: Midge Minium, MD;  Location: [facility];  Service: Endoscopy;  Laterality: N/A;   TOENAIL EXCISION      Prior to Admission medications   Medication Sig Start Date End Date Taking? Authorizing Provider  albuterol (VENTOLIN HFA) 108 (90 Base) MCG/ACT inhaler Inhale 2 puffs into the lungs every 6 (six) hours as needed for wheezing or shortness of breath. 07/09/22  Yes [name], FNP  azelastine (ASTELIN) 0.1 % nasal spray Place 2 sprays into both nostrils 2 (two) times daily. Use in each nostril as directed 07/09/22  Yes Merita Norton T, FNP  cetirizine (ZYRTEC) 10 MG tablet TAKE 1 TABLET (10 MG TOTAL) BY MOUTH DAILY. 05/07/22  Yes Merita Norton T, FNP  Cholecalciferol (VITAMIN D3) 125 MCG (5000 UT) CHEW Chew 5,000 Units by mouth daily.   Yes [provider]  escitalopram (LEXAPRO) 10 MG tablet Take 1 tablet (10 mg total) by mouth at bedtime. 06/18/22  Yes Merita Norton T, FNP   MAGNESIUM PO Take by mouth.   Yes [provider]  meloxicam (MOBIC) 15 MG tablet TAKE 1 TABLET EVERY DAY AS NEEDED FOR PAIN 05/07/22  Yes [name], FNP  methocarbamol (ROBAXIN) 500 MG tablet Take 1 tablet (500 mg total) by mouth daily as needed for muscle spasms. 11/27/22  Yes [name], FNP  metoCLOPramide (REGLAN) 10 MG tablet Take 1 tablet (10 mg total) by mouth 3 (three) times daily as needed for nausea. 11/21/20  Yes Merita Norton T, FNP  Multiple Vitamin (MULTIVITAMIN) tablet Take 1 tablet by mouth daily.   Yes [provider]  omeprazole (PRILOSEC) 40 MG capsule TAKE 1 CAPSULE EVERY DAY 12/12/21  Yes Merita Norton T, FNP  Probiotic Product (PROBIOTIC ADVANCED PO) Take 1 capsule by mouth.   Yes [provider]  simvastatin (ZOCOR) 20 MG tablet Take 1 tablet (20 mg total) by mouth at bedtime. 05/07/22  Yes [name], FNP  tiZANidine (ZANAFLEX) 4 MG tablet Take 1 tablet (4 mg total) by mouth every 4 (four) hours as needed (headache). Patient taking differently: Take 4 mg by mouth every 4 (four) hours as needed (migraine). 11/21/20  Yes Merita Norton T, FNP  traZODone (DESYREL) 50 MG tablet TAKE 1 TABLET AT BEDTIME AS NEEDED FOR SLEEP 10/29/22  Yes [name], FNP  vitamin B-12 (  CYANOCOBALAMIN) 1000 MCG tablet Take 1,000 mcg by mouth daily.   Yes [provider]  busPIRone (BUSPAR) 5 MG tablet Take 1 tablet (5 mg total) by mouth 3 (three) times daily as needed. Patient not taking: Reported on 01/23/2023 05/29/22   Merita Norton T, FNP    Allergies as of 01/09/2023 - Review Complete 01/02/2023  Allergen Reaction Noted   Aspirin  03/10/2015   Erythromycin  03/10/2015   Hydrocodone-acetaminophen Itching 03/10/2015   Sulfa antibiotics  03/10/2015    Family History  Problem Relation Age of Onset   Healthy Sister    Heart disease Brother    Breast cancer Maternal Aunt     Social History   Socioeconomic History   Marital status: Divorced     Spouse name: Not on file   Number of children: 3   Years of education: Not on file   Highest education level: Some college, no degree  Occupational History   Occupation: retired  Tobacco Use   Smoking status: Every Day    Current packs/day: 0.25    Average packs/day: 0.3 packs/day for 54.9 years (13.7 ttl pk-yrs)    Types: Cigarettes    Start date: 1970   Smokeless tobacco: Never   Tobacco comments:    2 cigarettes daily.  Vaping Use   Vaping status: Never Used  Substance and Sexual Activity   Alcohol use: Yes    Alcohol/week: 7.0 - 9.0 standard drinks of alcohol    Types: 7 - 9 Glasses of wine per week   Drug use: No   Sexual activity: Not on file  Other Topics Concern   Not on file  Social History Narrative   Not on file   Social Determinants of Health   Financial Resource Strain: Low Risk  (08/05/2022)   Overall Financial Resource Strain (CARDIA)    Difficulty of Paying Living Expenses: Not hard at all  Food Insecurity: No Food Insecurity (08/05/2022)   Hunger Vital Sign    Worried About Running Out of Food in the Last Year: Never true    Ran Out of Food in the Last Year: Never true  Transportation Needs: No Transportation Needs (08/05/2022)   PRAPARE - Administrator, Civil Service (Medical): No    Lack of Transportation (Non-Medical): No  Physical Activity: Sufficiently Active (08/05/2022)   Exercise Vital Sign    Days of Exercise per Week: 3 days    Minutes of Exercise per Session: 60 min  Stress: No Stress Concern Present (08/05/2022)   [employer] of Occupational Health - Occupational Stress Questionnaire    Feeling of Stress : Not at all  Social Connections: Moderately Isolated (08/05/2022)   Social Connection and Isolation Panel [NHANES]    Frequency of Communication with Friends and Family: More than three times a week    Frequency of Social Gatherings with Friends and Family: More than three times a week    Attends Religious Services: More than 4  times per year    Active Member of [employer] or Organizations: No    Attends Banker Meetings: Never    Marital Status: Divorced  Catering manager Violence: Not At Risk (08/05/2022)   Humiliation, Afraid, Rape, and Kick questionnaire    Fear of Current or Ex-Partner: No    Emotionally Abused: No    Physically Abused: No    Sexually Abused: No    Review of Systems: See HPI, otherwise negative ROS  Physical Exam: Pulse 64  Temp 97.9 F (36.6 C) (Temporal)   Resp 14   Ht 5\' 1"  (1.549 m)   Wt 54.5 kg   SpO2 100%   BMI 22.69 kg/m  General:   Alert,  pleasant and cooperative in NAD Head:  Normocephalic and atraumatic. Neck:  Supple; no masses or thyromegaly. Lungs:  Clear throughout to auscultation.    Heart:  Regular rate and rhythm. Abdomen:  Soft, nontender and nondistended. Normal bowel sounds, without guarding, and without rebound.   Neurologic:  Alert and  oriented x4;  grossly normal neurologically.  Impression/Plan: Melissa Buck is here for an colonoscopy to be performed for diarrhea  Risks, benefits, limitations, and alternatives regarding  colonoscopy have been reviewed with the patient.  Questions have been answered.  All parties agreeable.   Midge Minium, MD  01/27/2023, 10:06 AM

## 2023-01-27 NOTE — Op Note (Signed)
[facility] Patient Name: Melissa Buck Procedure Date: 01/27/2023 11:25 AM MRN: [id number] Account #: [id number] Date of Birth: 15-Aug-1950 Admit Type: Outpatient Age: 72 Room: [facility] OR ROOM 01 Gender: Female Note Status: Finalized Instrument Name: [id number] Procedure:             Colonoscopy Indications:           Chronic diarrhea with increased fecal calprotectin Providers:             Midge Minium MD, MD Referring MD:          Daryl Eastern. Suzie Portela (Referring MD) Medicines:             Propofol per Anesthesia Procedure:             Pre-Anesthesia Assessment:                        - Prior to the procedure, a History and Physical was                         performed, and patient medications and allergies were                         reviewed. The patient's tolerance of previous                         anesthesia was also reviewed. The risks and benefits                         of the procedure and the sedation options and risks                         were discussed with the patient. All questions were                         answered, and informed consent was obtained. Prior                         Anticoagulants: The patient has taken no anticoagulant                         or antiplatelet agents. ASA Grade Assessment: II - A                         patient with mild systemic disease. After reviewing                         the risks and benefits, the patient was deemed in                         satisfactory condition to undergo the procedure.                        After obtaining informed consent, the colonoscope was                         passed under direct vision. Throughout the procedure,  the patient's blood pressure, pulse, and oxygen                         saturations were monitored continuously. The was                         introduced through the anus and advanced to the the                         terminal  ileum. The colonoscopy was performed without                         difficulty. The patient tolerated the procedure well.                         The quality of the bowel preparation was excellent. Findings:      The perianal and digital rectal examinations were normal.      The terminal ileum appeared normal. Biopsies were taken with a cold       forceps for histology.      The colon (entire examined portion) appeared normal. Biopsies for       histology were taken with a cold forceps from the entire colon for       evaluation of microscopic colitis.      Non-bleeding internal hemorrhoids were found during retroflexion. The       hemorrhoids were Grade II (internal hemorrhoids that prolapse but reduce       spontaneously). Impression:            - The examined portion of the ileum was normal.                         Biopsied.                        - The entire examined colon is normal. Biopsied.                        - Non-bleeding internal hemorrhoids. Recommendation:        - Discharge patient to home.                        - Resume previous diet.                        - Continue present medications.                        - Await pathology results. Procedure Code(s):     --- Professional ---                        [phone], Colonoscopy, flexible; with biopsy, single or                         multiple Diagnosis Code(s):     --- Professional ---                        K52.9, Noninfective gastroenteritis and colitis,  unspecified CPT copyright 2022 American Medical Association. All rights reserved. The codes documented in this report are preliminary and upon coder review may  be revised to meet current compliance requirements. Midge Minium MD, MD 01/27/2023 11:45:14 AM This report has been signed electronically. Number of Addenda: 0 Note Initiated On: 01/27/2023 11:25 AM Scope Withdrawal Time: 0 hours 6 minutes 47 seconds  Total Procedure Duration: 0  hours 12 minutes 42 seconds  Estimated Blood Loss:  Estimated blood loss: none.      [facility]

## 2023-01-28 ENCOUNTER — Encounter: Payer: Self-pay | Admitting: Gastroenterology

## 2023-01-28 ENCOUNTER — Ambulatory Visit: Payer: Medicare HMO | Admitting: Physician Assistant

## 2023-01-28 LAB — SURGICAL PATHOLOGY

## 2023-02-06 ENCOUNTER — Telehealth: Payer: Self-pay | Admitting: Physician Assistant

## 2023-02-06 NOTE — Telephone Encounter (Signed)
The patient called back to speak to [facility] to get her results.

## 2023-02-09 ENCOUNTER — Other Ambulatory Visit: Payer: Self-pay | Admitting: Family Medicine

## 2023-02-10 MED ORDER — BUDESONIDE 3 MG PO CPEP
9.0000 mg | ORAL_CAPSULE | Freq: Every day | ORAL | 1 refills | Status: DC
Start: 2023-02-10 — End: 2023-02-26

## 2023-02-10 NOTE — Addendum Note (Signed)
Addended by: Roena Malady on: 02/10/2023 10:02 AM   Modules accepted: Orders

## 2023-02-10 NOTE — Telephone Encounter (Signed)
Left detailed msg on VM per HIPAA Rx sent through e-scribe  

## 2023-02-10 NOTE — Telephone Encounter (Signed)
Please advise 

## 2023-02-11 ENCOUNTER — Telehealth: Payer: Self-pay | Admitting: Physician Assistant

## 2023-02-11 NOTE — Telephone Encounter (Signed)
PA had already been submitted via [email or web address], however pt states she has tried this Rx in the past many years ago for treatment and had a "bad reaction". Pt will make changes to her diet and increase probiotics to see if it can help resolve Sx... Pt has f/u appt scheduled with Celso Amy, PA in January

## 2023-02-11 NOTE — Telephone Encounter (Signed)
The patient called in because [employer] want to cover her medication (Entocort EC) 3 MG. She said [employer] inform her they will have to speak to someone here before they cover the medication (Entocort EC) 3 MG. The patient also said if this is the same medication from last time then she can't take it because she is allergic to it.

## 2023-02-12 NOTE — Telephone Encounter (Signed)
Pt verified taking the Robaxin for muscle relaxer and Zanaflex as needed for headache.

## 2023-02-17 ENCOUNTER — Ambulatory Visit: Payer: Medicare HMO | Admitting: Physician Assistant

## 2023-02-24 ENCOUNTER — Encounter: Payer: Self-pay | Admitting: Physician Assistant

## 2023-02-24 ENCOUNTER — Ambulatory Visit (INDEPENDENT_AMBULATORY_CARE_PROVIDER_SITE_OTHER): Payer: Medicare HMO | Admitting: Physician Assistant

## 2023-02-24 ENCOUNTER — Telehealth: Payer: Self-pay | Admitting: Family Medicine

## 2023-02-24 VITALS — BP 126/67 | HR 62 | Temp 98.1°F | Ht 61.0 in | Wt 120.9 lb

## 2023-02-24 DIAGNOSIS — R058 Other specified cough: Secondary | ICD-10-CM

## 2023-02-24 DIAGNOSIS — J449 Chronic obstructive pulmonary disease, unspecified: Secondary | ICD-10-CM

## 2023-02-24 DIAGNOSIS — F17200 Nicotine dependence, unspecified, uncomplicated: Secondary | ICD-10-CM | POA: Diagnosis not present

## 2023-02-24 DIAGNOSIS — J069 Acute upper respiratory infection, unspecified: Secondary | ICD-10-CM

## 2023-02-24 MED ORDER — FLUTICASONE PROPIONATE 50 MCG/ACT NA SUSP: 2.0000 | Freq: Every day | NASAL | 6 refills | Status: AC

## 2023-02-24 MED ORDER — BENZONATATE 200 MG PO CAPS
200.0000 mg | ORAL_CAPSULE | Freq: Two times a day (BID) | ORAL | 0 refills | Status: DC | PRN
Start: 2023-02-24 — End: 2023-03-27

## 2023-02-24 NOTE — Telephone Encounter (Signed)
Benzonatate 200 mg. Capsules are not covered by insurance.  Please call in something different

## 2023-02-26 ENCOUNTER — Other Ambulatory Visit: Payer: Self-pay | Admitting: Family Medicine

## 2023-02-26 DIAGNOSIS — E7800 Pure hypercholesterolemia, unspecified: Secondary | ICD-10-CM

## 2023-02-26 NOTE — Progress Notes (Signed)
Established patient visit  Patient: Melissa Buck   DOB: 12-15-1950   72 y.o. Female  MRN: [id number] Visit Date: 02/24/2023  Today's healthcare provider: Debera Lat, PA-C   Chief Complaint  Patient presents with   Cough    Productive cough X a little over a week. Associated with congestion. Reports trying several medications over the counter. Patient reports husband had symptoms first but was not seen. Patient reports her symptoms seem better but at night she is coughing constantly.   Subjective      Discussed the use of AI scribe software for clinical note transcription with the patient, who gave verbal consent to proceed.  History of Present Illness   The patient, with a history of COPD, presents with upper respiratory symptoms for almost a week. They describe nasal congestion, post nasal drainage, and chest congestion. They deny fever and shortness of breath. They have been smoking two cigarettes a day for the past six months, down from five cigarettes a day. They have been taking Mucinex DM for symptom relief.           02/24/2023    2:42 PM 11/21/2022    2:49 PM 08/05/2022    9:32 AM  Depression screen PHQ 2/9  Decreased Interest 1 0 0  Down, Depressed, Hopeless 0 0 0  PHQ - 2 Score 1 0 0  Altered sleeping 1    Tired, decreased energy 1    Change in appetite 1    Feeling bad or failure about yourself  0    Trouble concentrating 0    Moving slowly or fidgety/restless 0    Suicidal thoughts 0    PHQ-9 Score 4    Difficult doing work/chores Not difficult at all  Not difficult at all      02/24/2023    2:42 PM 05/29/2022    3:35 PM 04/12/2019    2:55 PM 01/19/2019    4:06 PM  GAD 7 : Generalized Anxiety Score  Nervous, Anxious, on Edge 0 2 0 0  Control/stop worrying 0 1 1 0  Worry too much - different things 0 1 1 0  Trouble relaxing 0 2 0 0  Restless 0 2 1 1   Easily annoyed or irritable 0 1 0 0  Afraid - awful might happen 0 0 0 0  Total GAD 7 Score 0 9 3 1    Anxiety Difficulty Not difficult at all Somewhat difficult Not difficult at all Not difficult at all    Medications: Outpatient Medications Prior to Visit  Medication Sig   albuterol (VENTOLIN HFA) 108 (90 Base) MCG/ACT inhaler Inhale 2 puffs into the lungs every 6 (six) hours as needed for wheezing or shortness of breath.   azelastine (ASTELIN) 0.1 % nasal spray Place 2 sprays into both nostrils 2 (two) times daily. Use in each nostril as directed   cetirizine (ZYRTEC) 10 MG tablet TAKE 1 TABLET (10 MG TOTAL) BY MOUTH DAILY.   Cholecalciferol (VITAMIN D3) 125 MCG (5000 UT) CHEW Chew 5,000 Units by mouth daily.   escitalopram (LEXAPRO) 10 MG tablet Take 1 tablet (10 mg total) by mouth at bedtime.   meloxicam (MOBIC) 15 MG tablet TAKE 1 TABLET EVERY DAY AS NEEDED FOR PAIN   methocarbamol (ROBAXIN) 500 MG tablet TAKE 1 TABLET EVERY DAY AS NEEDED FOR MUSCLE SPASM(S)   metoCLOPramide (REGLAN) 10 MG tablet Take 1 tablet (10 mg total) by mouth 3 (three) times daily as needed for nausea.   Multiple Vitamin (  MULTIVITAMIN) tablet Take 1 tablet by mouth daily.   omeprazole (PRILOSEC) 40 MG capsule TAKE 1 CAPSULE EVERY DAY   Probiotic Product (PROBIOTIC ADVANCED PO) Take 1 capsule by mouth.   simvastatin (ZOCOR) 20 MG tablet Take 1 tablet (20 mg total) by mouth at bedtime.   tiZANidine (ZANAFLEX) 4 MG tablet Take 1 tablet (4 mg total) by mouth every 4 (four) hours as needed (headache). (Patient taking differently: Take 4 mg by mouth every 4 (four) hours as needed (migraine).)   traZODone (DESYREL) 50 MG tablet TAKE 1 TABLET AT BEDTIME AS NEEDED FOR SLEEP   vitamin B-12 (CYANOCOBALAMIN) 1000 MCG tablet Take 1,000 mcg by mouth daily.   budesonide (ENTOCORT EC) 3 MG 24 hr capsule Take 3 capsules (9 mg total) by mouth daily. (Patient not taking: Reported on 02/24/2023)   busPIRone (BUSPAR) 5 MG tablet Take 1 tablet (5 mg total) by mouth 3 (three) times daily as needed. (Patient not taking: Reported on  01/23/2023)   MAGNESIUM PO Take by mouth. (Patient not taking: Reported on 02/24/2023)   No facility-administered medications prior to visit.    Review of Systems All negative Except see HPI       Objective    BP 126/67 (BP Location: Right Arm, Patient Position: Sitting, Cuff Size: Small)   Pulse 62   Temp 98.1 F (36.7 C) (Oral)   Ht 5\' 1"  (1.549 m)   Wt 120 lb 14.4 oz (54.8 kg)   SpO2 95%   BMI 22.84 kg/m     Physical Exam Vitals reviewed.  Constitutional:      General: She is not in acute distress.    Appearance: Normal appearance. She is well-developed. She is not diaphoretic.  HENT:     Head: Normocephalic and atraumatic.  Eyes:     General: No scleral icterus.    Conjunctiva/sclera: Conjunctivae normal.  Neck:     Thyroid: No thyromegaly.  Cardiovascular:     Rate and Rhythm: Normal rate and regular rhythm.     Pulses: Normal pulses.     Heart sounds: Normal heart sounds. No murmur heard. Pulmonary:     Effort: Pulmonary effort is normal. No respiratory distress.     Breath sounds: Normal breath sounds. No wheezing, rhonchi or rales.  Musculoskeletal:     Cervical back: Neck supple.     Right lower leg: No edema.     Left lower leg: No edema.  Lymphadenopathy:     Cervical: No cervical adenopathy.  Skin:    General: Skin is warm and dry.     Findings: No rash.  Neurological:     Mental Status: She is alert and oriented to person, place, and time. Mental status is at baseline.  Psychiatric:        Mood and Affect: Mood normal.        Behavior: Behavior normal.      No results found for any visits on 02/24/23.      Assessment and Plan    Upper Respiratory Infection Cough Symptoms for almost a week with nasal and chest congestion. No fever. No shortness of breath or wheezing. No indigestion. Physical exam shows nasal congestion. Lungs sound clear. -Continue symptomatic treatment:hydration, steam inhalation, and rest. -Continue Claritin as  needed. -Continue Mucinex DM as needed. -Consider using Flonase and saline nasal rinse for nasal congestion. -If symptoms persist or worsen after Thanksgiving, contact the office for further evaluation and possible prescription medication.   In the setting of Chronic  Obstructive Pulmonary Disease (COPD) Smoking Reports smoking 2 cigarettes a day for the past 6 months, down from 5 cigarettes a day. No current exacerbation or distress. -Encouraged to continue smoking cessation efforts.  Follow-up Monitor symptoms and contact the office if symptoms persist or worsen after Thanksgiving. If unavailable, consider visiting urgent care.     No orders of the defined types were placed in this encounter.   No follow-ups on file.   The patient was advised to call back or seek an in-person evaluation if the symptoms worsen or if the condition fails to improve as anticipated.  I discussed the assessment and treatment plan with the patient. The patient was provided an opportunity to ask questions and all were answered. The patient agreed with the plan and demonstrated an understanding of the instructions.  I, Debera Lat, PA-C have reviewed all documentation for this visit. The documentation on 02/24/2023  for the exam, diagnosis, procedures, and orders are all accurate and complete.  Debera Lat, [facility], MMS [address] [facility] [phone] (phone) [phone] (fax)  [facility]

## 2023-02-27 NOTE — Telephone Encounter (Signed)
Attempted to reach pt, left VM to call back. 

## 2023-02-27 NOTE — Telephone Encounter (Signed)
Detailed VM left per DPR. CRM created. Ok for [facility] to advise/clarify if patient returns call

## 2023-02-27 NOTE — Telephone Encounter (Signed)
Patient returned  call. Reviewed message from J. Ostwalt, PA on 02/27/23. Patient reports she is feeling better and will continued OTC medications. Patient would like to use Good Rx coupon next time due to she already paid approx. $30 for tessalon pereles.

## 2023-03-16 NOTE — Progress Notes (Signed)
 Ellouise Console, PA-C [address]  [address]  [place], [place] [address]  Main: [phone]  Fax: [phone]   Primary Care Physician: Emilio Kelly DASEN, FNP (Inactive)  Primary Gastroenterologist:  Ellouise Console, PA-C / Dr. Rogelia Copping    CC: Follow-up diarrhea and collagenous colitis  HPI: Melissa Buck is a 73 y.o. female returns for 72-month follow-up of diarrhea.  Prior history of collagenous colitis many years ago.  Personal history of colon polyps.  01/2023 stool studies/labs: GI pathogen panel and C. difficile toxin PCR stool test negative.  Normal fecal pancreatic elastase.  Moderately elevated fecal calprotectin.  Negative celiac panel.  Normal CBC, CMP, TSH.  01/27/2023 colonoscopy by Dr. Copping: Excellent prep.  Normal colon mucosa throughout entire colon.  No polyps.  Grade 2 nonbleeding internal hemorrhoids.  Biopsies positive for collagenous colitis.  It was recommended that patient start budesonide , however it was not covered by insurance and patient reported previous allergic reaction to budesonide .  Colonoscopy 12/2018 by Dr. Copping showed nonbleeding internal hemorrhoids, otherwise normal. No polyps. 5-year repeat (12/2023). She also had colonoscopies in 2015, 2010, and 2008. No previous EGD.   Patient did not start budesonide  because it was cost prohibitive and she thought she had allergic reaction to it many years ago.  Uncertain what type of reaction.  She is not currently taking any treatment for diarrhea or collagenous colitis other than probiotics daily.  Currently she is having 2 or 3 loose stools per day.  Overall diarrhea has improved.  Current Outpatient Medications  Medication Sig Dispense Refill   albuterol  (VENTOLIN  HFA) 108 (90 Base) MCG/ACT inhaler Inhale 2 puffs into the lungs every 6 (six) hours as needed for wheezing or shortness of breath. 18 g 3   azelastine  (ASTELIN ) 0.1 % nasal spray Place 2 sprays into both nostrils 2 (two) times daily. Use in  each nostril as directed 30 mL 12   benzonatate  (TESSALON ) 200 MG capsule Take 1 capsule (200 mg total) by mouth 2 (two) times daily as needed for cough. 20 capsule 0   cetirizine  (ZYRTEC ) 10 MG tablet TAKE 1 TABLET (10 MG TOTAL) BY MOUTH DAILY. 90 tablet 3   Cholecalciferol (VITAMIN D3) 125 MCG (5000 UT) CHEW Chew 5,000 Units by mouth daily.     escitalopram  (LEXAPRO ) 10 MG tablet Take 1 tablet (10 mg total) by mouth at bedtime. 90 tablet 3   fluticasone  (FLONASE ) 50 MCG/ACT nasal spray Place 2 sprays into both nostrils daily. 16 g 6   methocarbamol  (ROBAXIN ) 500 MG tablet TAKE 1 TABLET EVERY DAY AS NEEDED FOR MUSCLE SPASM(S) 90 tablet 0   metoCLOPramide  (REGLAN ) 10 MG tablet Take 1 tablet (10 mg total) by mouth 3 (three) times daily as needed for nausea. 120 tablet 3   Multiple Vitamin (MULTIVITAMIN) tablet Take 1 tablet by mouth daily.     Probiotic Product (PROBIOTIC ADVANCED PO) Take 1 capsule by mouth.     simvastatin  (ZOCOR ) 20 MG tablet Take 1 tablet (20 mg total) by mouth at bedtime. 90 tablet 3   tiZANidine  (ZANAFLEX ) 4 MG tablet Take 1 tablet (4 mg total) by mouth every 4 (four) hours as needed (headache). (Patient taking differently: Take 4 mg by mouth every 4 (four) hours as needed (migraine).) 100 tablet 3   traZODone  (DESYREL ) 50 MG tablet TAKE 1 TABLET AT BEDTIME AS NEEDED FOR SLEEP 90 tablet 3   vitamin B-12 (CYANOCOBALAMIN) 1000 MCG tablet Take 1,000 mcg by mouth daily.  No current facility-administered medications for this visit.    Allergies as of 03/17/2023 - Review Complete 03/17/2023  Allergen Reaction Noted   Budesonide  Other (See Comments) 03/17/2023   Aspirin  03/10/2015   Erythromycin  03/10/2015   Hydrocodone -acetaminophen  Itching 03/10/2015   Sulfa antibiotics  03/10/2015    Past Medical History:  Diagnosis Date   Arthritis    knees, ankles,    Asthma    Colitis    COPD (chronic obstructive pulmonary disease) (HCC)    Full thickness rotator cuff tear  08/09/2022   GERD (gastroesophageal reflux disease)    Headache    history of migraines   Hyperlipidemia    Pain in joint of left shoulder 08/28/2022   Stiffness of left shoulder joint 08/28/2022   Wears dentures    upper full plate    Past Surgical History:  Procedure Laterality Date   ABDOMINAL HYSTERECTOMY     CATARACT EXTRACTION W/ INTRAOCULAR LENS IMPLANT Bilateral    CERVICAL FUSION     C5 - C6 fusion   COLONOSCOPY     COLONOSCOPY WITH PROPOFOL  N/A 12/11/2018   Procedure: COLONOSCOPY WITH PROPOFOL ;  Surgeon: Jinny Carmine, MD;  Location: [facility];  Service: Endoscopy;  Laterality: N/A;   COLONOSCOPY WITH PROPOFOL  N/A 01/27/2023   Procedure: COLONOSCOPY WITH PROPOFOL ;  Surgeon: Jinny Carmine, MD;  Location: [facility];  Service: Endoscopy;  Laterality: N/A;   TOENAIL EXCISION      Review of Systems:    All systems reviewed and negative except where noted in HPI.   Physical Examination:   BP 115/74   Pulse 78   Temp 98 F (36.7 C)   Ht 5' 1 (1.549 m)   Wt 118 lb 6.4 oz (53.7 kg)   BMI 22.37 kg/m   General: Well-nourished, well-developed in no acute distress.  Neuro: Alert and oriented x 3.  Grossly intact.  Psych: Alert and cooperative, normal mood and affect.   Imaging Studies: No results found.  Assessment and Plan:   Melissa Buck is a 73 y.o. y/o female returns for follow-up of diarrhea.  Recent colonoscopy showed collagenous colitis, otherwise normal.  Stool studies negative for infections.  Normal labs.  Budesonide  was not covered by insurance.  Patient reports previous history of allergic reaction to budesonide .  Collagenous colitis flare with Diarrhea; Improving  Start FiberCon, Take 2 tablets once daily  Take OTC Imodium 1-2 tablets Q 4 hours as needed for diarrhea; Max 8 tab / day.  Stop Mobic  / Meloxicam   Avoid NSAIDS, PPI's.    Discuss stopping or changing Simvastatin  with PC if diarrhea worsens.    Ellouise Console,  PA-C  Follow up as needed if symptoms worsen.

## 2023-03-17 ENCOUNTER — Encounter: Payer: Self-pay | Admitting: Physician Assistant

## 2023-03-17 ENCOUNTER — Ambulatory Visit (INDEPENDENT_AMBULATORY_CARE_PROVIDER_SITE_OTHER): Payer: 59 | Admitting: Physician Assistant

## 2023-03-17 VITALS — BP 115/74 | HR 78 | Temp 98.0°F | Ht 61.0 in | Wt 118.4 lb

## 2023-03-17 DIAGNOSIS — K52831 Collagenous colitis: Secondary | ICD-10-CM | POA: Diagnosis not present

## 2023-03-19 DIAGNOSIS — M25512 Pain in left shoulder: Secondary | ICD-10-CM | POA: Diagnosis not present

## 2023-03-27 ENCOUNTER — Ambulatory Visit (INDEPENDENT_AMBULATORY_CARE_PROVIDER_SITE_OTHER): Payer: 59 | Admitting: Family Medicine

## 2023-03-27 ENCOUNTER — Encounter: Payer: Self-pay | Admitting: Family Medicine

## 2023-03-27 VITALS — BP 126/80 | HR 59 | Wt 120.5 lb

## 2023-03-27 DIAGNOSIS — R030 Elevated blood-pressure reading, without diagnosis of hypertension: Secondary | ICD-10-CM

## 2023-03-27 DIAGNOSIS — E7800 Pure hypercholesterolemia, unspecified: Secondary | ICD-10-CM | POA: Diagnosis not present

## 2023-03-27 DIAGNOSIS — R197 Diarrhea, unspecified: Secondary | ICD-10-CM | POA: Diagnosis not present

## 2023-03-27 MED ORDER — EZETIMIBE 10 MG PO TABS: 10.0000 mg | ORAL_TABLET | Freq: Every day | ORAL | 3 refills | Status: DC

## 2023-03-27 NOTE — Assessment & Plan Note (Signed)
Patient recently stopped Simvastatin due to chronic colitis recommended by her GI provider, Celso Amy, PA.  Colitis symptoms are nearly resolved since stopping statin and  No history of heart attack or stroke, but a remote history of a mini-stroke, unknown year.  Last lipid panel was well controlled while on statin.  Given intolerance and GI symptoms per provider recommendation - will stop simvastatin.  -Start Zetia 10mg  for cholesterol control and ASCVD risk.   -Check lipid panel in 4 months to assess control off statin.   -Continue tobacco cessation, down to 0-3 cigarettes per day.

## 2023-03-27 NOTE — Assessment & Plan Note (Addendum)
Much improved with stopping NSAIDs and statin Continue to hold  NSAIDs and statins per GI rec. Collagenous colitis flare with Diarrhea On FiberCon, Take 2 tablets once daily Taking OTC Imodium as needed per GI Continue to follow with GI

## 2023-03-27 NOTE — Progress Notes (Signed)
Established Patient Office Visit  Introduced to nurse practitioner role and practice setting.  All questions answered.  Discussed provider/patient relationship and expectations.   Subjective   Patient ID: Melissa Buck, female    DOB: 06/12/1950  Age: 73 y.o. MRN: [id number]  Chief Complaint  Patient presents with   digestve issue    Has no digestive issue anymore after going to gastro. Was given a list of things that have been contributing to digestive issues.    A 73 year old patient with a history of depression and colitis presents for a follow-up visit after seeing a gastroenterologist. The patient reports significant improvement in her gastrointestinal symptoms after discontinuing meloxicam and simvastatin as advised by the gastroenterologist. The patient is seeking an alternative medication for cholesterol management that does not contain statins due to the adverse gastrointestinal effects experienced. The patient has been on statin therapy for over 20 years and has a history of a mini-stroke.  The patient also reports chronic pain in the left shoulder due to a rotator cuff injury. She was previously on meloxicam and a muscle relaxer for pain management, but these were discontinued due to their impact on the patient's colitis. The patient has been undergoing physical therapy for the shoulder pain, but it has not been effective. A recent cortisone injection also did not provide relief. The patient has been advised to consider surgery for the shoulder issue.  The patient describes a history of migraines, which are currently well-controlled. She has a regimen of medications to manage the migraines, including meloxicam, metoclopramide, and tizanidine. The patient also reports occasional nausea associated with the migraines, which is managed with metoclopramide as needed.  The patient is a light smoker, reporting one to three cigarettes per day. She also reports taking omega-3 supplements  daily and is making efforts to improve her diet and overall health. The patient is retired and enjoys maintaining a clean home, although she has had to reduce her activities due to her shoulder pain.        03/27/2023    1:35 PM 02/24/2023    2:42 PM 11/21/2022    2:49 PM  Depression screen PHQ 2/9  Decreased Interest 0 1 0  Down, Depressed, Hopeless 0 0 0  PHQ - 2 Score 0 1 0  Altered sleeping  1   Tired, decreased energy  1   Change in appetite  1   Feeling bad or failure about yourself   0   Trouble concentrating  0   Moving slowly or fidgety/restless  0   Suicidal thoughts  0   PHQ-9 Score  4   Difficult doing work/chores  Not difficult at all        03/27/2023    1:35 PM 02/24/2023    2:42 PM 05/29/2022    3:35 PM 04/12/2019    2:55 PM  GAD 7 : Generalized Anxiety Score  Nervous, Anxious, on Edge 0 0 2 0  Control/stop worrying 1 0 1 1  Worry too much - different things 0 0 1 1  Trouble relaxing 0 0 2 0  Restless 0 0 2 1  Easily annoyed or irritable 0 0 1 0  Afraid - awful might happen 0 0 0 0  Total GAD 7 Score 1 0 9 3  Anxiety Difficulty Not difficult at all Not difficult at all Somewhat difficult Not difficult at all     Review of Systems  All other systems reviewed and are negative.   Negative  unless indicated in HPI   Objective:     BP 126/80 (BP Location: Left Arm, Cuff Size: Normal)   Pulse (!) 59   Wt 120 lb 8 oz (54.7 kg)   SpO2 98%   BMI 22.77 kg/m    Physical Exam Constitutional:      General: She is not in acute distress.    Appearance: Normal appearance. She is normal weight. She is not toxic-appearing or diaphoretic.  HENT:     Head: Normocephalic.     Nose: Nose normal.     Mouth/Throat:     Mouth: Mucous membranes are moist.     Pharynx: Oropharynx is clear.  Eyes:     Extraocular Movements: Extraocular movements intact.     Pupils: Pupils are equal, round, and reactive to light.  Cardiovascular:     Rate and Rhythm: Normal rate  and regular rhythm.     Pulses: Normal pulses.     Heart sounds: Normal heart sounds. No murmur heard.    No friction rub. No gallop.  Pulmonary:     Effort: No respiratory distress.     Breath sounds: No stridor. No wheezing, rhonchi or rales.  Chest:     Chest wall: No tenderness.  Abdominal:     Palpations: Abdomen is soft.  Musculoskeletal:     Right lower leg: No edema.     Left lower leg: No edema.  Skin:    General: Skin is warm and dry.     Capillary Refill: Capillary refill takes less than 2 seconds.  Neurological:     General: No focal deficit present.     Mental Status: She is alert and oriented to person, place, and time. Mental status is at baseline.  Psychiatric:        Mood and Affect: Mood normal.        Behavior: Behavior normal.        Thought Content: Thought content normal.        Judgment: Judgment normal.      No results found for any visits on 03/27/23.    The 10-year ASCVD risk score (Arnett DK, et al., 2019) is: 14.5%    Assessment & Plan:  Hypercholesteremia Assessment & Plan: Patient recently stopped Simvastatin due to chronic colitis recommended by her GI provider, Celso Amy, PA.  Colitis symptoms are nearly resolved since stopping statin and  No history of heart attack or stroke, but a remote history of a mini-stroke, unknown year.  Last lipid panel was well controlled while on statin.  Given intolerance and GI symptoms per provider recommendation - will stop simvastatin.  -Start Zetia 10mg  for cholesterol control and ASCVD risk.   -Check lipid panel in 4 months to assess control off statin.   -Continue tobacco cessation, down to 0-3 cigarettes per day.  Orders: -     Ezetimibe; Take 1 tablet (10 mg total) by mouth daily.  Dispense: 90 tablet; Refill: 3  Diarrhea, unspecified type Assessment & Plan: Much improved with stopping NSAIDs and statin Continue to hold  NSAIDs and statins per GI rec. Collagenous colitis flare with  Diarrhea On FiberCon, Take 2 tablets once daily Taking OTC Imodium as needed per GI Continue to follow with GI    Elevated blood pressure reading in office without diagnosis of hypertension Assessment & Plan: Elevated BP today = 121/96  with recheck 126/80 Pt stated was in argument with husband prior to visit Recommend GOAL 119/79 Low sodium diet, exercise Will reassess  at physical     Return in about 4 months (around 07/25/2023) for annual physical and annual lab work.   I, Sallee Provencal, FNP, have reviewed all documentation for this visit. The documentation on 03/27/23 for the exam, diagnosis, procedures, and orders are all accurate and complete.   Sallee Provencal, FNP

## 2023-03-27 NOTE — Assessment & Plan Note (Signed)
Elevated BP today = 121/96  with recheck 126/80 Pt stated was in argument with husband prior to visit Recommend GOAL 119/79 Low sodium diet, exercise Will reassess at physical

## 2023-06-16 ENCOUNTER — Emergency Department

## 2023-06-16 ENCOUNTER — Emergency Department
Admission: EM | Admit: 2023-06-16 | Discharge: 2023-06-16 | Disposition: A | Discharge status: Home / Self Care | Attending: Emergency Medicine | Admitting: Emergency Medicine

## 2023-06-16 ENCOUNTER — Other Ambulatory Visit: Payer: Self-pay

## 2023-06-16 DIAGNOSIS — T7840XA Allergy, unspecified, initial encounter: Secondary | ICD-10-CM

## 2023-06-16 DIAGNOSIS — T402X5A Adverse effect of other opioids, initial encounter: Secondary | ICD-10-CM | POA: Insufficient documentation

## 2023-06-16 DIAGNOSIS — R0602 Shortness of breath: Secondary | ICD-10-CM | POA: Diagnosis present

## 2023-06-16 DIAGNOSIS — J449 Chronic obstructive pulmonary disease, unspecified: Secondary | ICD-10-CM | POA: Insufficient documentation

## 2023-06-16 LAB — CBC WITH DIFFERENTIAL/PLATELET
Abs Immature Granulocytes: 0.03 10*3/uL (ref 0.00–0.07)
Basophils Absolute: 0 10*3/uL (ref 0.0–0.1)
Basophils Relative: 0 %
Eosinophils Absolute: 0 10*3/uL (ref 0.0–0.5)
Eosinophils Relative: 0 %
HCT: 34.8 % — ABNORMAL LOW (ref 36.0–46.0)
Hemoglobin: 12.1 g/dL (ref 12.0–15.0)
Immature Granulocytes: 0 %
Lymphocytes Relative: 7 %
Lymphs Abs: 0.6 10*3/uL — ABNORMAL LOW (ref 0.7–4.0)
MCH: 33.5 pg (ref 26.0–34.0)
MCHC: 34.8 g/dL (ref 30.0–36.0)
MCV: 96.4 fL (ref 80.0–100.0)
Monocytes Absolute: 0.3 10*3/uL (ref 0.1–1.0)
Monocytes Relative: 3 %
Neutro Abs: 7.3 10*3/uL (ref 1.7–7.7)
Neutrophils Relative %: 90 %
Platelets: 277 10*3/uL (ref 150–400)
RBC: 3.61 MIL/uL — ABNORMAL LOW (ref 3.87–5.11)
RDW: 13.1 % (ref 11.5–15.5)
WBC: 8.2 10*3/uL (ref 4.0–10.5)
nRBC: 0 % (ref 0.0–0.2)

## 2023-06-16 LAB — BASIC METABOLIC PANEL WITH GFR
Anion gap: 11 (ref 5–15)
BUN: 9 mg/dL (ref 8–23)
CO2: 21 mmol/L — ABNORMAL LOW (ref 22–32)
Calcium: 8.3 mg/dL — ABNORMAL LOW (ref 8.9–10.3)
Chloride: 101 mmol/L (ref 98–111)
Creatinine, Ser: 0.56 mg/dL (ref 0.44–1.00)
GFR, Estimated: >60 mL/min (ref >60)
Glucose, Bld: 140 mg/dL — ABNORMAL HIGH (ref 70–99)
Potassium: 3.7 mmol/L (ref 3.5–5.1)
Sodium: 133 mmol/L — ABNORMAL LOW (ref 135–145)

## 2023-06-16 LAB — TROPONIN I (HIGH SENSITIVITY)
Troponin I (High Sensitivity): 5 ng/L (ref <18)
Troponin I (High Sensitivity): 4 ng/L (ref <18)

## 2023-06-16 MED ORDER — EPINEPHRINE 0.3 MG/0.3ML IJ SOAJ
0.3000 mg | Freq: Once | INTRAMUSCULAR | Status: AC
  Administered 2023-06-16: 0.3 mg via INTRAMUSCULAR
  Filled 2023-06-16: qty 0.3

## 2023-06-16 MED ORDER — FAMOTIDINE IN NACL 20-0.9 MG/50ML-% IV SOLN
20.0000 mg | Freq: Once | INTRAVENOUS | Status: AC
  Administered 2023-06-16: 20 mg via INTRAVENOUS
  Filled 2023-06-16: qty 50

## 2023-06-16 MED ORDER — METHYLPREDNISOLONE SODIUM SUCC 125 MG IJ SOLR
125.0000 mg | Freq: Once | INTRAMUSCULAR | Status: AC
  Administered 2023-06-16: 125 mg via INTRAVENOUS
  Filled 2023-06-16: qty 2

## 2023-06-16 MED ORDER — EPINEPHRINE 0.3 MG/0.3ML IJ SOAJ: 0.3000 mg | INTRAMUSCULAR | 1 refills | Status: AC | PRN

## 2023-06-16 MED ORDER — DIPHENHYDRAMINE HCL 50 MG/ML IJ SOLN
50.0000 mg | Freq: Once | INTRAMUSCULAR | Status: AC
  Administered 2023-06-16: 50 mg via INTRAVENOUS
  Filled 2023-06-16: qty 1

## 2023-06-16 NOTE — ED Provider Notes (Signed)
 [facility] Provider Note    Event Date/Time   First MD Initiated Contact with Patient 06/16/23 1516     (approximate)   History   Chief Complaint Shortness of Breath   HPI  Melissa Buck is a 73 y.o. female with past medical history of hyperlipidemia and COPD who presents to the ED complaining of shortness of breath.  Patient reports that she underwent arthroscopic surgery on her left shoulder earlier this morning at the [facility] in [place].  She states that she left the hospital feeling fine, was prescribed oxycodone to take for pain.  Shortly after taking this medication about 1 hour prior to arrival, she began to feel short of breath with some itchiness across her back and the top of her head.  She denies any associated rash, did feel slightly nauseous and dizzy but denies any vomiting or diarrhea.  She has never taken oxycodone in the past, denies any history of similar symptoms.  She did not have any difficulty breathing, cough, or fever prior to procedure and she denies any associated chest pain.     Physical Exam   Triage Vital Signs: ED Triage Vitals [06/16/23 1459]  Encounter Vitals Group     BP (!) 145/119     Systolic BP Percentile      Diastolic BP Percentile      Pulse Rate 94     Resp 19     Temp 97.7 F (36.5 C)     Temp Source Oral     SpO2 100 %     Weight 120 lb (54.4 kg)     Height 5\' 1"  (1.549 m)     Head Circumference      Peak Flow      Pain Score 0     Pain Loc      Pain Education      Exclude from Growth Chart     Most recent vital signs: Vitals:   06/16/23 1556 06/16/23 1730  BP:  116/66  Pulse: 82 82  Resp: 13 (!) 24  Temp:    SpO2: 98% 100%    Constitutional: Alert and oriented. Eyes: Conjunctivae are normal. Head: Atraumatic. Nose: No congestion/rhinnorhea. Mouth/Throat: Mucous membranes are moist.  Cardiovascular: Normal rate, regular rhythm. Grossly normal heart sounds.  2+  radial pulses bilaterally. Respiratory: Normal respiratory effort.  No retractions. Lungs CTAB. Gastrointestinal: Soft and nontender. No distention. Musculoskeletal: No lower extremity tenderness nor edema.  Neurologic:  Normal speech and language. No gross focal neurologic deficits are appreciated.    ED Results / Procedures / Treatments   Labs (all labs ordered are listed, but only abnormal results are displayed) Labs Reviewed  CBC WITH DIFFERENTIAL/PLATELET - Abnormal; Notable for the following components:      Result Value   RBC 3.61 (*)    HCT 34.8 (*)    Lymphs Abs 0.6 (*)    All other components within normal limits  BASIC METABOLIC PANEL WITH GFR - Abnormal; Notable for the following components:   Sodium 133 (*)    CO2 21 (*)    Glucose, Bld 140 (*)    Calcium 8.3 (*)    All other components within normal limits  TROPONIN I (HIGH SENSITIVITY)  TROPONIN I (HIGH SENSITIVITY)     EKG  ED ECG REPORT I, Chesley Noon, the attending physician, personally viewed and interpreted this ECG.   Date: 06/16/2023  EKG Time: 15:23  Rate: 82  Rhythm: normal sinus rhythm  Axis: Normal  Intervals:none  ST&T Change: None  RADIOLOGY Chest x-ray reviewed and interpreted by me with no infiltrate, edema, or effusion.  PROCEDURES:  Critical Care performed: No  Procedures   MEDICATIONS ORDERED IN ED: Medications  EPINEPHrine (EPI-PEN) injection 0.3 mg (0.3 mg Intramuscular Given 06/16/23 1538)  methylPREDNISolone sodium succinate (SOLU-MEDROL) 125 mg/2 mL injection 125 mg (125 mg Intravenous Given 06/16/23 1539)  diphenhydrAMINE (BENADRYL) injection 50 mg (50 mg Intravenous Given 06/16/23 1540)  famotidine (PEPCID) IVPB 20 mg premix (0 mg Intravenous Stopped 06/16/23 1626)     IMPRESSION / MDM / ASSESSMENT AND PLAN / ED COURSE  I reviewed the triage vital signs and the nursing notes.                              73 y.o. female with past medical history of hyperlipidemia  and COPD who presents to the ED complaining of sudden onset shortness of breath after taking oxycodone for pain about an hour prior to arrival.  Patient's presentation is most consistent with acute presentation with potential threat to life or bodily function.  Differential diagnosis includes, but is not limited to, anaphylaxis, allergic reaction, bronchospasm, ACS, PE, pneumonia, pneumothorax, musculoskeletal pain, GERD, anxiety.  Patient nontoxic-appearing and in no acute distress, vital signs are unremarkable.  EKG shows no evidence of arrhythmia or ischemia, she is breathing comfortably here in the ED with lungs clear to auscultation bilaterally, but reports subjective shortness of breath.  Given associated itchiness and nausea, will treat for potential allergic reaction with IM epinephrine, IV Solu-Medrol, Benadryl, and Pepcid.  Given somewhat atypical presentation, will also check chest x-ray and labs including 2 sets of troponin.  Labs are reassuring with no significant anemia, leukocytosis, electrolyte abnormality, or AKI.  Chest x-ray is unremarkable and 2 sets of troponin are within normal limits.  On reassessment, patient reports that her difficulty breathing has resolved, do suspect this was due to an allergic reaction.  She was observed for 4 hours with no evidence of recurrent anaphylaxis, is appropriate for discharge home with outpatient follow-up.  She was prescribed epipen and counseled to discuss alternative pain medication with her surgeon.  She was counseled to return to the ED for new or worsening symptoms, patient agrees with plan.      FINAL CLINICAL IMPRESSION(S) / ED DIAGNOSES   Final diagnoses:  Allergic reaction, initial encounter  SOB (shortness of breath)     Rx / DC Orders   ED Discharge Orders          Ordered    EPINEPHrine 0.3 mg/0.3 mL IJ SOAJ injection  As needed        06/16/23 1944             Note:  This document was prepared using Dragon  voice recognition software and may include unintentional dictation errors.   Twilla Galea, MD 06/16/23 [phone]

## 2023-06-16 NOTE — ED Notes (Signed)
 Responded to call bell. Pt stated she needed to use the rest room. Used wheel chair to help patient to restroom. Pt able to stand and ambulate to commode with assistance of her husband. Pt returned to stretcher and resting comfortably.

## 2023-06-16 NOTE — ED Triage Notes (Signed)
 Pt states she is here for possible allergic reaction to oxy, pt states surgery today on L shoulder. Pt hyperventilating in triage.   Husband yelling at this RN prior to triage.

## 2023-06-16 NOTE — ED Notes (Signed)
 Portable Xray at bedside.

## 2023-06-17 ENCOUNTER — Telehealth: Payer: Self-pay

## 2023-06-17 NOTE — Transitions of Care (Post Inpatient/ED Visit) (Signed)
   06/17/2023  Name: Melissa Buck MRN: [id number] DOB: 05-18-1950  Today's TOC FU Call Status: Today's TOC FU Call Status:: Successful TOC FU Call Completed TOC FU Call Complete Date: 06/17/23 Patient's Name and Date of Birth confirmed.  Transition Care Management Follow-up Telephone Call Date of Discharge: 06/16/23 Discharge Facility: [facility]) Type of Discharge: Emergency Department Reason for ED Visit: Other: (allergy) How have you been since you were released from the hospital?: Better Any questions or concerns?: No  Items Reviewed: Did you receive and understand the discharge instructions provided?: Yes Medications obtained,verified, and reconciled?: Yes (Medications Reviewed) Any new allergies since your discharge?: No Dietary orders reviewed?: Yes Do you have support at home?: Yes People in Home [RPT]: friend(s)  Medications Reviewed Today: Medications Reviewed Today   Medications were not reviewed in this encounter     Home Care and Equipment/Supplies: Were Home Health Services Ordered?: NA Any new equipment or medical supplies ordered?: NA  Functional Questionnaire: Do you need assistance with bathing/showering or dressing?: Yes Do you need assistance with meal preparation?: Yes Do you need assistance with eating?: No Do you have difficulty maintaining continence: No Do you need assistance with getting out of bed/getting out of a chair/moving?: No Do you have difficulty managing or taking your medications?: Yes  Follow up appointments reviewed: PCP Follow-up appointment confirmed?: No MD Provider Line Number:[phone] Given: No Specialist Hospital Follow-up appointment confirmed?: Yes Date of Specialist follow-up appointment?: 06/18/23 Follow-Up Specialty Provider:: surgeon Do you need transportation to your follow-up appointment?: No Do you understand care options if your condition(s) worsen?: Yes-patient verbalized  understanding    SIGNATURE Darrall Ellison, LPN [facility] Nurse Health Advisor Direct Dial [phone]

## 2023-07-22 ENCOUNTER — Other Ambulatory Visit: Payer: Self-pay | Admitting: Family Medicine

## 2023-07-22 DIAGNOSIS — F5101 Primary insomnia: Secondary | ICD-10-CM

## 2023-07-22 NOTE — Telephone Encounter (Signed)
 Copied from CRM [id number]. Topic: Clinical - Medication Refill >> Jul 22, 2023 12:15 PM Bearl Botts E wrote: Medication: escitalopram  (LEXAPRO ) 10 MG tablet   traZODone  (DESYREL ) 50 MG tablet   Has the patient contacted their pharmacy? Yes (Agent: If no, request that the patient contact the pharmacy for the refill. If patient does not wish to contact the pharmacy document the reason why and proceed with request.) (Agent: If yes, when and what did the pharmacy advise?)  This is the patient's preferred pharmacy:  [id number] [place], [place] - [address] AT [facility] [address] [place] [address] [place] [address] Phone: [phone] Fax: [phone]  Is this the correct pharmacy for this prescription? Yes If no, delete pharmacy and type the correct one.   Has the prescription been filled recently? Yes  Is the patient out of the medication? Yes  Has the patient been seen for an appointment in the last year OR does the patient have an upcoming appointment? Yes  Can we respond through MyChart? Yes  Agent: Please be advised that Rx refills may take up to 3 business days. We ask that you follow-up with your pharmacy.

## 2023-07-24 MED ORDER — ESCITALOPRAM OXALATE 10 MG PO TABS: 10.0000 mg | ORAL_TABLET | Freq: Every day | ORAL | 0 refills | Status: DC

## 2023-07-24 MED ORDER — TRAZODONE HCL 50 MG PO TABS: 50.0000 mg | ORAL_TABLET | Freq: Every evening | ORAL | 0 refills | Status: DC | PRN

## 2023-07-24 NOTE — Telephone Encounter (Signed)
 Requested Prescriptions  Pending Prescriptions Disp Refills   escitalopram  (LEXAPRO ) 10 MG tablet 90 tablet 0    Sig: Take 1 tablet (10 mg total) by mouth at bedtime.     Psychiatry:  Antidepressants - SSRI Failed - 07/24/2023  8:57 AM      Failed - Valid encounter within last 6 months    Recent Outpatient Visits   None            Passed - Completed PHQ-2 or PHQ-9 in the last 360 days       traZODone  (DESYREL ) 50 MG tablet 90 tablet 0    Sig: Take 1 tablet (50 mg total) by mouth at bedtime as needed. for sleep     Psychiatry: Antidepressants - Serotonin Modulator Failed - 07/24/2023  8:57 AM      Failed - Valid encounter within last 6 months    Recent Outpatient Visits   None            Passed - Completed PHQ-2 or PHQ-9 in the last 360 days

## 2023-07-25 NOTE — Telephone Encounter (Signed)
 Copied from CRM [id number]. Topic: Clinical - Medication Refill >> Jul 22, 2023 12:15 PM Bearl Botts E wrote: Medication: escitalopram  (LEXAPRO ) 10 MG tablet   traZODone  (DESYREL ) 50 MG tablet   Has the patient contacted their pharmacy? Yes (Agent: If no, request that the patient contact the pharmacy for the refill. If patient does not wish to contact the pharmacy document the reason why and proceed with request.) (Agent: If yes, when and what did the pharmacy advise?)  This is the patient's preferred pharmacy:  [facility][id number] [place], [place] - [address] AT [facility] OF [address] [place] [address] [place] [address] Phone: [phone] Fax: [phone]  Is this the correct pharmacy for this prescription? Yes If no, delete pharmacy and type the correct one.   Has the prescription been filled recently? Yes  Is the patient out of the medication? Yes  Has the patient been seen for an appointment in the last year OR does the patient have an upcoming appointment? Yes  Can we respond through MyChart? Yes  Agent: Please be advised that Rx refills may take up to 3 business days. We ask that you follow-up with your pharmacy. >> Jul 25, 2023 11:30 AM CMA Sha'Taria T wrote: Both rx filled 07/23/21 >> Jul 25, 2023 11:10 AM Emylou G wrote: Patient checking status of refills

## 2023-07-30 ENCOUNTER — Encounter: Payer: Self-pay | Admitting: Family Medicine

## 2023-08-06 ENCOUNTER — Ambulatory Visit: Payer: Self-pay

## 2023-08-06 DIAGNOSIS — Z0000 Encounter for general adult medical examination without abnormal findings: Secondary | ICD-10-CM | POA: Diagnosis not present

## 2023-08-06 DIAGNOSIS — M75122 Complete rotator cuff tear or rupture of left shoulder, not specified as traumatic: Secondary | ICD-10-CM | POA: Diagnosis not present

## 2023-08-06 NOTE — Progress Notes (Signed)
 Subjective:   Melissa Buck is a 73 y.o. who presents for a Medicare Wellness preventive visit.  As a reminder, Annual Wellness Visits don't include a physical exam, and some assessments may be limited, especially if this visit is performed virtually. We may recommend an in-person follow-up visit with your provider if needed.  Visit Complete: Virtual I connected with  Melissa Buck on 08/06/23 by a audio enabled telemedicine application and verified that I am speaking with the correct person using two identifiers.  Patient Location: Home  Provider Location: Home Office  I discussed the limitations of evaluation and management by telemedicine. The patient expressed understanding and agreed to proceed.  Vital Signs: Because this visit was a virtual/telehealth visit, some criteria may be missing or patient reported. Any vitals not documented were not able to be obtained and vitals that have been documented are patient reported.  VideoDeclined- This patient declined Librarian, academic. Therefore the visit was completed with audio only.  Persons Participating in Visit: Patient.  AWV Questionnaire: No: Patient Medicare AWV questionnaire was not completed prior to this visit.  Cardiac Risk Factors include: smoking/ tobacco exposure     Objective:     Today's Vitals   08/06/23 0932  PainSc: 3    There is no height or weight on file to calculate BMI.     08/06/2023    9:41 AM 01/27/2023    9:12 AM 08/05/2022    9:37 AM 08/01/2021    9:21 AM 10/26/2020   11:55 AM 02/16/2020    1:42 PM 11/30/2019    5:06 PM  Advanced Directives  Does Patient Have a Medical Advance Directive? No No Yes No No No No  Type of Surveyor, minerals;Living will   Healthcare Power of [place];Living will   Copy of Healthcare Power of Attorney in Chart?      No - copy requested   Would patient like information on creating a medical advance directive?  No - Patient declined No - Patient declined  No - Patient declined No - Patient declined No - Patient declined     Current Medications (verified) Outpatient Encounter Medications as of 08/06/2023  Medication Sig   albuterol  (VENTOLIN  HFA) 108 (90 Base) MCG/ACT inhaler Inhale 2 puffs into the lungs every 6 (six) hours as needed for wheezing or shortness of breath.   azelastine  (ASTELIN ) 0.1 % nasal spray Place 2 sprays into both nostrils 2 (two) times daily. Use in each nostril as directed   cetirizine  (ZYRTEC ) 10 MG tablet TAKE 1 TABLET (10 MG TOTAL) BY MOUTH DAILY.   Cholecalciferol (VITAMIN D3) 125 MCG (5000 UT) CHEW Chew 5,000 Units by mouth daily.   EPINEPHrine  0.3 mg/0.3 mL IJ SOAJ injection Inject 0.3 mg into the muscle as needed for anaphylaxis.   escitalopram  (LEXAPRO ) 10 MG tablet Take 1 tablet (10 mg total) by mouth at bedtime.   ezetimibe  (ZETIA ) 10 MG tablet Take 1 tablet (10 mg total) by mouth daily.   fluticasone  (FLONASE ) 50 MCG/ACT nasal spray Place 2 sprays into both nostrils daily.   metoCLOPramide  (REGLAN ) 10 MG tablet Take 1 tablet (10 mg total) by mouth 3 (three) times daily as needed for nausea.   Multiple Vitamin (MULTIVITAMIN) tablet Take 1 tablet by mouth daily.   Probiotic Product (PROBIOTIC ADVANCED PO) Take 1 capsule by mouth.   traZODone  (DESYREL ) 50 MG tablet Take 1 tablet (50 mg total) by mouth at bedtime as needed.  for sleep   vitamin B-12 (CYANOCOBALAMIN) 1000 MCG tablet Take 1,000 mcg by mouth daily.   methocarbamol  (ROBAXIN ) 500 MG tablet TAKE 1 TABLET EVERY DAY AS NEEDED FOR MUSCLE SPASM(S) (Patient not taking: Reported on 08/06/2023)   No facility-administered encounter medications on file as of 08/06/2023.    Allergies (verified) Hydrocodone , Hydrocodone -acetaminophen , Oxycodone , Budesonide , Statins, Aspirin, Erythromycin, and Sulfa antibiotics   History: Past Medical History:  Diagnosis Date   Arthritis    knees, ankles,    Asthma    Colitis    COPD  (chronic obstructive pulmonary disease) (HCC)    Full thickness rotator cuff tear 08/09/2022   GERD (gastroesophageal reflux disease)    Headache    history of migraines   Hyperlipidemia    Pain in joint of left shoulder 08/28/2022   Stiffness of left shoulder joint 08/28/2022   Wears dentures    upper full plate   Past Surgical History:  Procedure Laterality Date   ABDOMINAL HYSTERECTOMY     CATARACT EXTRACTION W/ INTRAOCULAR LENS IMPLANT Bilateral    CERVICAL FUSION     C5 - C6 fusion   COLONOSCOPY     COLONOSCOPY WITH PROPOFOL  N/A 12/11/2018   Procedure: COLONOSCOPY WITH PROPOFOL ;  Surgeon: Marnee Sink, MD;  Location: [facility];  Service: Endoscopy;  Laterality: N/A;   COLONOSCOPY WITH PROPOFOL  N/A 01/27/2023   Procedure: COLONOSCOPY WITH PROPOFOL ;  Surgeon: Marnee Sink, MD;  Location[facility];  Service: Endoscopy;  Laterality: N/A;   TOENAIL EXCISION     Family History  Problem Relation Age of Onset   Healthy Sister    Heart disease Brother    Breast cancer Maternal Aunt    Social History   Socioeconomic History   Marital status: Divorced    Spouse name: Not on file   Number of children: 3   Years of education: Not on file   Highest education level: Some college, no degree  Occupational History   Occupation: retired  Tobacco Use   Smoking status: Every Day    Current packs/day: 0.25    Average packs/day: 0.3 packs/day for 55.4 years (13.9 ttl pk-yrs)    Types: Cigarettes    Start date: 1970   Smokeless tobacco: Never   Tobacco comments:    2 cigarettes daily.  Vaping Use   Vaping status: Never Used  Substance and Sexual Activity   Alcohol use: Yes    Alcohol/week: 7.0 - 9.0 standard drinks of alcohol    Types: 7 - 9 Glasses of wine per week   Drug use: No   Sexual activity: Not on file  Other Topics Concern   Not on file  Social History Narrative   Not on file   Social Drivers of Health   Financial Resource Strain: Low Risk   (08/06/2023)   Overall Financial Resource Strain (CARDIA)    Difficulty of Paying Living Expenses: Not hard at all  Food Insecurity: No Food Insecurity (08/06/2023)   Hunger Vital Sign    Worried About Running Out of Food in the Last Year: Never true    Ran Out of Food in the Last Year: Never true  Transportation Needs: No Transportation Needs (08/06/2023)   PRAPARE - Administrator, Civil Service (Medical): No    Lack of Transportation (Non-Medical): No  Physical Activity: Insufficiently Active (08/06/2023)   Exercise Vital Sign    Days of Exercise per Week: 2 days    Minutes of Exercise per Session: 60  min  Stress: No Stress Concern Present (08/06/2023)   [employer] of Occupational Health - Occupational Stress Questionnaire    Feeling of Stress : Only a little  Social Connections: Socially Isolated (08/06/2023)   Social Connection and Isolation Panel [NHANES]    Frequency of Communication with Friends and Family: Three times a week    Frequency of Social Gatherings with Friends and Family: More than three times a week    Attends Religious Services: Never    Database administrator or Organizations: No    Attends Banker Meetings: Never    Marital Status: Divorced    Tobacco Counseling Ready to quit: Not Answered Counseling given: Not Answered Tobacco comments: 2 cigarettes daily.    Clinical Intake:  Pre-visit preparation completed: Yes  Pain : 0-10 Pain Score: 3  Pain Type: Chronic pain Pain Location: Shoulder Pain Orientation: Left Pain Radiating Towards: HAD SGY 2 MONTHS AGO Pain Descriptors / Indicators: Aching, Discomfort Pain Onset: More than a month ago Pain Frequency: Constant Pain Relieving Factors: MEDS, P.T.  Pain Relieving Factors: MEDS, P.T.  BMI - recorded: 22.7 Nutritional Status: BMI of 19-24  Normal Nutritional Risks: None Diabetes: No  Lab Results  Component Value Date   HGBA1C 5.5 03/15/2022     How often do you need  to have someone help you when you read instructions, pamphlets, or other written materials from your doctor or pharmacy?: 1 - Never  Interpreter Needed?: No  Information entered by :: Dellie Fergusson, LPN   Activities of Daily Living    08/06/2023    9:42 AM 01/27/2023    9:11 AM  In your present state of health, do you have any difficulty performing the following activities:  Hearing? 0 0  Vision? 0 0  Difficulty concentrating or making decisions? 0 0  Walking or climbing stairs? 0   Dressing or bathing? 0   Doing errands, shopping? 0   Preparing Food and eating ? N   Using the Toilet? N   In the past six months, have you accidently leaked urine? N   Do you have problems with loss of bowel control? N   Managing your Medications? N   Managing your Finances? N   Housekeeping or managing your Housekeeping? N     Patient Care Team: Tasia Farr, FNP as PCP - General (Family Medicine) Lamon Pillow, MD as Referring Physician (Family Medicine) Spencer Dy, OD (Optometry) Dot Gazella, DPM as Consulting Physician (Podiatry) Marlynn Singer, MD (Orthopedic Surgery) [facility], [facility]  I have updated your Care Teams any recent Medical Services you may have received from other providers in the past year.     Assessment:    This is a routine wellness examination for Melissa Buck.  Hearing/Vision screen Hearing Screening - Comments:: NO AIDS Vision Screening - Comments:: NO GLASSES- DR.[email or web address]   Goals Addressed             This Visit's Progress    DIET - INCREASE WATER  INTAKE         Depression Screen     08/06/2023    9:38 AM 03/27/2023    1:35 PM 02/24/2023    2:42 PM 11/21/2022    2:49 PM 08/05/2022    9:32 AM 06/18/2022    3:58 PM 05/29/2022    3:34 PM  PHQ 2/9 Scores  PHQ - 2 Score 0 0 1 0 0 0 2  PHQ- 9 Score 0  4  0 9    Fall Risk     08/06/2023    9:41 AM 02/24/2023    2:42 PM 08/05/2022    9:30 AM 06/18/2022    3:58 PM 05/29/2022    3:34 PM  Fall  Risk   Falls in the past year? 0 0 0 1 1  Number falls in past yr: 0 0 0 0 0  Injury with Fall? 0 0 0 1 1  Risk for fall due to : No Fall Risks No Fall Risks No Fall Risks No Fall Risks History of fall(s)  Follow up Falls evaluation completed Falls evaluation completed Education provided;Falls prevention discussed  Falls evaluation completed    MEDICARE RISK AT HOME:  Medicare Risk at Home Any stairs in or around the home?: Yes If so, are there any without handrails?: Yes Home free of loose throw rugs in walkways, pet beds, electrical cords, etc?: Yes Adequate lighting in your home to reduce risk of falls?: Yes Life alert?: No Use of a cane, walker or w/c?: No Grab bars in the bathroom?: Yes Shower chair or bench in shower?: No Elevated toilet seat or a handicapped toilet?: Yes  TIMED UP AND GO:  Was the test performed?  No  Cognitive Function: 6CIT completed        08/06/2023    9:44 AM 08/05/2022    9:41 AM 08/01/2021    9:24 AM 01/19/2019    3:44 PM  6CIT Screen  What Year? 0 points 0 points 0 points 0 points  What month? 0 points 0 points 0 points 0 points  What time? 0 points 0 points 0 points 0 points  Count back from 20 0 points 0 points 0 points 0 points  Months in reverse 0 points 0 points 0 points 0 points  Repeat phrase 2 points 0 points 0 points 0 points  Total Score 2 points 0 points 0 points 0 points    Immunizations Immunization History  Administered Date(s) Administered   Fluad Quad(high Dose 65+) 11/21/2020, 12/14/2021   Fluad Trivalent(High Dose 65+) 11/21/2022   Influenza, High Dose Seasonal PF 12/16/2017   PFIZER(Purple Top)SARS-COV-2 Vaccination 06/02/2019, 06/23/2019   Pneumococcal Conjugate-13 12/16/2017   Pneumococcal Polysaccharide-23 07/08/2019   Td 09/15/2003   Tdap 07/18/2016   Zoster Recombinant(Shingrix) 12/14/2021    Screening Tests Health Maintenance  Topic Date Due   Zoster Vaccines- Shingrix (2 of 2) 02/08/2022   COVID-19  Vaccine (3 - 2024-25 season) 11/03/2022   INFLUENZA VACCINE  10/03/2023   Medicare Annual Wellness (AWV)  08/05/2024   MAMMOGRAM  10/29/2024   DEXA SCAN  02/08/2026   DTaP/Tdap/Td (3 - Td or Tdap) 07/19/2026   Colonoscopy  01/27/2028   Pneumonia Vaccine 60+ Years old  Completed   Hepatitis C Screening  Completed   HPV VACCINES  Aged Out   Meningococcal B Vaccine  Aged Out    Health Maintenance  Health Maintenance Due  Topic Date Due   Zoster Vaccines- Shingrix (2 of 2) 02/08/2022   COVID-19 Vaccine (3 - 2024-25 season) 11/03/2022   Health Maintenance Items Addressed: UP TO DATE W/ MAMMOGRAM, COLONOSCOPY & BDS; WANTS NO MORE COVIDS; UP TO DATE ON TDAP & PNA  Additional Screening:  Vision Screening: Recommended annual ophthalmology exams for early detection of glaucoma and other disorders of the eye. Would you like a referral to an eye doctor? No    Dental Screening: Recommended annual dental exams for proper oral hygiene  Community Resource Referral /  Chronic Care Management: CRR required this visit?  No   CCM required this visit?  No   Plan:    I have personally reviewed and noted the following in the patient's chart:   Medical and social history Use of alcohol, tobacco or illicit drugs  Current medications and supplements including opioid prescriptions. Patient is not currently taking opioid prescriptions. Functional ability and status Nutritional status Physical activity Advanced directives List of other physicians Hospitalizations, surgeries, and ER visits in previous 12 months Vitals Screenings to include cognitive, depression, and falls Referrals and appointments  In addition, I have reviewed and discussed with patient certain preventive protocols, quality metrics, and best practice recommendations. A written personalized care plan for preventive services as well as general preventive health recommendations were provided to patient.   Pinky Bright,  LPN   03/09/1094   After Visit Summary: (MyChart) Due to this being a telephonic visit, the after visit summary with patients personalized plan was offered to patient via MyChart   Notes: Nothing significant to report at this time.

## 2023-08-06 NOTE — Patient Instructions (Addendum)
 Ms. Melissa Buck , Thank you for taking time out of your busy schedule to complete your Annual Wellness Visit with me. I enjoyed our conversation and look forward to speaking with you again next year. I, as well as your care team,  appreciate your ongoing commitment to your health goals. Please review the following plan we discussed and let me know if I can assist you in the future.  Follow up Visits: Next Medicare AWV with our clinical staff:   08/17/24 @ 8:50 AM BY PHONE Have you seen your provider in the last 6 months (3 months if uncontrolled diabetes)? Yes  Clinician Recommendations:  Aim for 30 minutes of exercise or brisk walking, 6-8 glasses of water , and 5 servings of fruits and vegetables each day. TAKE CARE!      This is a list of the screening recommended for you and due dates:  Health Maintenance  Topic Date Due   Zoster (Shingles) Vaccine (2 of 2) 02/08/2022   COVID-19 Vaccine (3 - 2024-25 season) 11/03/2022   Flu Shot  10/03/2023   Medicare Annual Wellness Visit  08/05/2024   Mammogram  10/29/2024   DEXA scan (bone density measurement)  02/08/2026   DTaP/Tdap/Td vaccine (3 - Td or Tdap) 07/19/2026   Colon Cancer Screening  01/27/2028   Pneumonia Vaccine  Completed   Hepatitis C Screening  Completed   HPV Vaccine  Aged Out   Meningitis B Vaccine  Aged Out    Advanced directives: (ACP Link)Information on Advanced Care Planning can be found at [place]  Secretary of [facility] Advance Health Care Directives Advance Health Care Directives. [email or web address]  Advance Care Planning is important because it:  [x]  Makes sure you receive the medical care that is consistent with your values, goals, and preferences  [x]  It provides guidance to your family and loved ones and reduces their decisional burden about whether or not they are making the right decisions based on your wishes.  Follow the link provided in your after visit summary or read over the paperwork we have mailed to you to help  you started getting your Advance Directives in place. If you need assistance in completing these, please reach out to us  so that we can help you!

## 2023-08-08 DIAGNOSIS — M75122 Complete rotator cuff tear or rupture of left shoulder, not specified as traumatic: Secondary | ICD-10-CM | POA: Diagnosis not present

## 2023-08-11 DIAGNOSIS — M75122 Complete rotator cuff tear or rupture of left shoulder, not specified as traumatic: Secondary | ICD-10-CM | POA: Diagnosis not present

## 2023-08-13 DIAGNOSIS — M75122 Complete rotator cuff tear or rupture of left shoulder, not specified as traumatic: Secondary | ICD-10-CM | POA: Diagnosis not present

## 2023-08-19 DIAGNOSIS — M75122 Complete rotator cuff tear or rupture of left shoulder, not specified as traumatic: Secondary | ICD-10-CM | POA: Diagnosis not present

## 2023-08-21 DIAGNOSIS — M75122 Complete rotator cuff tear or rupture of left shoulder, not specified as traumatic: Secondary | ICD-10-CM | POA: Diagnosis not present

## 2023-08-28 DIAGNOSIS — M75122 Complete rotator cuff tear or rupture of left shoulder, not specified as traumatic: Secondary | ICD-10-CM | POA: Diagnosis not present

## 2023-09-01 DIAGNOSIS — M75122 Complete rotator cuff tear or rupture of left shoulder, not specified as traumatic: Secondary | ICD-10-CM | POA: Diagnosis not present

## 2023-09-04 DIAGNOSIS — M75122 Complete rotator cuff tear or rupture of left shoulder, not specified as traumatic: Secondary | ICD-10-CM | POA: Diagnosis not present

## 2023-09-09 DIAGNOSIS — M75122 Complete rotator cuff tear or rupture of left shoulder, not specified as traumatic: Secondary | ICD-10-CM | POA: Diagnosis not present

## 2023-09-11 DIAGNOSIS — M75122 Complete rotator cuff tear or rupture of left shoulder, not specified as traumatic: Secondary | ICD-10-CM | POA: Diagnosis not present

## 2023-09-16 DIAGNOSIS — M75122 Complete rotator cuff tear or rupture of left shoulder, not specified as traumatic: Secondary | ICD-10-CM | POA: Diagnosis not present

## 2023-09-19 DIAGNOSIS — M75122 Complete rotator cuff tear or rupture of left shoulder, not specified as traumatic: Secondary | ICD-10-CM | POA: Diagnosis not present

## 2023-10-10 DIAGNOSIS — M75122 Complete rotator cuff tear or rupture of left shoulder, not specified as traumatic: Secondary | ICD-10-CM | POA: Diagnosis not present

## 2023-10-22 ENCOUNTER — Other Ambulatory Visit: Payer: Self-pay | Admitting: Family Medicine

## 2023-10-22 DIAGNOSIS — F5101 Primary insomnia: Secondary | ICD-10-CM

## 2023-10-24 ENCOUNTER — Other Ambulatory Visit: Payer: Self-pay | Admitting: Family Medicine

## 2023-10-24 NOTE — Telephone Encounter (Signed)
 Copied from CRM #[id number]. Topic: Clinical - Medication Refill >> Oct 24, 2023  2:26 PM Donna BRAVO wrote: Patient has appt 11/12/23 and would like a refill to hold over until appt  Medication: escitalopram  (LEXAPRO ) 10 MG tablet   Has the patient contacted their pharmacy? Yes Pharmacy stated to call provider for refill  This is the patient's preferred pharmacy:   [facility] #[id number] - [place], [place] - [address] AT [facility] OF [address] [place] [address] [place] [address] Phone: [phone] Fax: [phone]  Is this the correct pharmacy for this prescription? Yes If no, delete pharmacy and type the correct one.   Has the prescription been filled recently? Yes  Is the patient out of the medication? No  Has the patient been seen for an appointment in the last year OR does the patient have an upcoming appointment? Yes  Can we respond through MyChart? No  Agent: Please be advised that Rx refills may take up to 3 business days. We ask that you follow-up with your pharmacy.

## 2023-10-27 NOTE — Telephone Encounter (Signed)
 Requested Prescriptions  Refused Prescriptions Disp Refills   escitalopram  (LEXAPRO ) 10 MG tablet 30 tablet 0     Psychiatry:  Antidepressants - SSRI Passed - 10/27/2023 11:33 AM      Passed - Completed PHQ-2 or PHQ-9 in the last 360 days      Passed - Valid encounter within last 6 months    Recent Outpatient Visits   None

## 2023-11-12 ENCOUNTER — Ambulatory Visit: Admitting: Family Medicine

## 2023-11-24 ENCOUNTER — Ambulatory Visit (INDEPENDENT_AMBULATORY_CARE_PROVIDER_SITE_OTHER): Admitting: Family Medicine

## 2023-11-24 ENCOUNTER — Encounter: Payer: Self-pay | Admitting: Family Medicine

## 2023-11-24 VITALS — BP 111/67 | HR 88 | Resp 16 | Ht 65.0 in | Wt 114.0 lb

## 2023-11-24 DIAGNOSIS — F333 Major depressive disorder, recurrent, severe with psychotic symptoms: Secondary | ICD-10-CM | POA: Diagnosis not present

## 2023-11-24 DIAGNOSIS — F5101 Primary insomnia: Secondary | ICD-10-CM | POA: Diagnosis not present

## 2023-11-24 DIAGNOSIS — F529 Unspecified sexual dysfunction not due to a substance or known physiological condition: Secondary | ICD-10-CM | POA: Diagnosis not present

## 2023-11-24 DIAGNOSIS — E7800 Pure hypercholesterolemia, unspecified: Secondary | ICD-10-CM | POA: Diagnosis not present

## 2023-11-24 DIAGNOSIS — G43909 Migraine, unspecified, not intractable, without status migrainosus: Secondary | ICD-10-CM | POA: Diagnosis not present

## 2023-11-24 MED ORDER — ESCITALOPRAM OXALATE 20 MG PO TABS: 20.0000 mg | ORAL_TABLET | Freq: Every day | ORAL | 1 refills | Status: AC

## 2023-11-24 MED ORDER — ESTRADIOL 0.1 MG/GM VA CREA: 1.0000 | TOPICAL_CREAM | Freq: Every day | VAGINAL | 12 refills | Status: AC

## 2023-11-24 NOTE — Progress Notes (Signed)
 Established Patient Office Visit  Introduced to nurse practitioner role and practice setting.  All questions answered.  Discussed provider/patient relationship and expectations.   Subjective   Patient ID: Melissa Buck, female    DOB: 03/17/1950  Age: 73 y.o. MRN: [id number]  Chief Complaint  Patient presents with   Follow-up    F/u opn meds. Pt wants to discuss increase anxiety meds and  talk about decreased sex drive.    Discussed the use of AI scribe software for clinical note transcription with the patient, who gave verbal consent to proceed.  History of Present Illness Melissa Buck is a 73 year old female who presents with a request to increase her Lexapro  dosage and concerns about decreased sex drive.  She is currently taking Lexapro  for depression and is interested in increasing her dosage to potentially improve her symptoms. She also uses trazodone  as needed for sleep and reports having a limited supply of Lexapro  at home.  She has experienced a significant decrease in her sex drive over the past three years, which began before starting Lexapro . She describes having 'no drive at all' and notes that her partner frequently comments on this issue. There is no pain or irritation during intercourse, although it has been a long time since she last engaged in sexual activity. She has attempted behavioral changes and using toys without success and is open to trying treatments to address this issue.  She has a history of migraines that were previously severe enough to require hospital visits. She currently manages her migraines with medications such as tizanidine , methocarbamol , and metoclopramide , using them as needed when symptoms begin.  In terms of social history, she enjoys [place] food and occasionally has a Medical sales representative with her meals. She lives with her husband, with whom she has been reunited for the past six years after a long separation. She has three children and three  grandchildren. She mentions sleeping in separate bedrooms due to her husband's snoring and bed-hogging tendencies.       08/06/2023    9:38 AM 03/27/2023    1:35 PM 02/24/2023    2:42 PM  Depression screen PHQ 2/9  Decreased Interest 0 0 1  Down, Depressed, Hopeless 0 0 0  PHQ - 2 Score 0 0 1  Altered sleeping 0  1  Tired, decreased energy 0  1  Change in appetite 0  1  Feeling bad or failure about yourself  0  0  Trouble concentrating 0  0  Moving slowly or fidgety/restless 0  0  Suicidal thoughts 0  0  PHQ-9 Score 0  4  Difficult doing work/chores Not difficult at all  Not difficult at all       03/27/2023    1:35 PM 02/24/2023    2:42 PM 05/29/2022    3:35 PM 04/12/2019    2:55 PM  GAD 7 : Generalized Anxiety Score  Nervous, Anxious, on Edge 0 0 2 0  Control/stop worrying 1 0 1 1  Worry too much - different things 0 0 1 1  Trouble relaxing 0 0 2 0  Restless 0 0 2 1  Easily annoyed or irritable 0 0 1 0  Afraid - awful might happen 0 0 0 0  Total GAD 7 Score 1 0 9 3  Anxiety Difficulty Not difficult at all Not difficult at all Somewhat difficult Not difficult at all     ROS  Negative unless indicated in HPI   Objective:  BP 111/67 (BP Location: Right Arm, Patient Position: Sitting, Cuff Size: Normal)   Pulse 88   Resp 16   Ht 5' 5 (1.651 m)   Wt 114 lb (51.7 kg)   SpO2 100%   BMI 18.97 kg/m    Physical Exam Constitutional:      General: She is not in acute distress.    Appearance: Normal appearance. She is obese. She is not ill-appearing, toxic-appearing or diaphoretic.  HENT:     Head: Normocephalic.     Nose: Nose normal.     Mouth/Throat:     Mouth: Mucous membranes are moist.     Pharynx: Oropharynx is clear.  Eyes:     Extraocular Movements: Extraocular movements intact.     Pupils: Pupils are equal, round, and reactive to light.  Cardiovascular:     Rate and Rhythm: Normal rate and regular rhythm.     Pulses: Normal pulses.     Heart  sounds: Normal heart sounds. No murmur heard.    No friction rub. No gallop.  Pulmonary:     Effort: No respiratory distress.     Breath sounds: No stridor. No wheezing, rhonchi or rales.  Chest:     Chest wall: No tenderness.  Musculoskeletal:     Right lower leg: No edema.     Left lower leg: No edema.  Skin:    General: Skin is warm and dry.     Capillary Refill: Capillary refill takes less than 2 seconds.  Neurological:     General: No focal deficit present.     Mental Status: She is alert and oriented to person, place, and time. Mental status is at baseline.  Psychiatric:        Mood and Affect: Mood normal.        Behavior: Behavior normal.        Thought Content: Thought content normal.        Judgment: Judgment normal.      No results found for any visits on 11/24/23.    The 10-year ASCVD risk score (Arnett DK, et al., 2019) is: 11.5%    Assessment & Plan:  Hypercholesteremia -     Comprehensive metabolic panel with GFR -     Lipid panel  Female sexual dysfunction -     Estradiol ; Place 1 Applicatorful vaginally at bedtime. Nightly for 2 weeks, then 1-2x per week after.  Dispense: 42.5 g; Refill: 12  Severe episode of recurrent major depressive disorder, with psychotic features (HCC) -     Escitalopram  Oxalate; Take 1 tablet (20 mg total) by mouth daily.  Dispense: 90 tablet; Refill: 1     Assessment and Plan Assessment & Plan Major Depressive Disorder, severe, recurrent Depression managed with Lexapro  10mg , she would like to increase today, increased depressive symptoms - states more irritability - Increase Lexapro  to 20 mg daily. - Declines CBT today - Follow up in four weeks to assess effectiveness of increased dose.  Female hypoactive sexual desire disorder Decreased libido predating Lexapro  use. Possible contributing factors include postmenopausal changes. Discussed potential treatments including behavioral changes and medications.  - Prescribe  topical estrogen cream for intravaginal and labial use. - Instruct her to use cream nightly for two weeks, then once or twice weekly. - Discuss potential switch to estrogen tablet if cream is not tolerated. - Discussed behavioral couples therapy   Migraines Hx of Severe migraines with neck spasms and nausea managed with tizanidine , methocarbamol , and metoclopramide . - Controlled right now,  no concerns - Continue current migraine management regimen as needed.  HLD Continue zetia  10mg  daily  Insomnia - continue prn trazodone  50mg   General Health Maintenance She declined flu vaccine today.     Return in about 4 weeks (around 12/22/2023) for Mood Check.    Curtis DELENA Boom, FNP

## 2023-11-25 ENCOUNTER — Ambulatory Visit: Payer: Self-pay

## 2023-11-25 NOTE — Telephone Encounter (Signed)
 Patient call note and symptoms reviewed. Agree with scheduled appt. Will evaluate during OV

## 2023-11-25 NOTE — Telephone Encounter (Signed)
 FYI Only or Action Required?: FYI only for provider.  Patient was last seen in primary care on 11/24/2023 by Wellington Curtis LABOR, FNP.  Called Nurse Triage reporting Chest Injury.  Symptoms began several days ago.  Interventions attempted: OTC medications: Tylenol .  Symptoms are: unchanged.  Triage Disposition: See Physician Within 24 Hours  Patient/caregiver understands and will follow disposition?: Yes                             Copied from CRM [id number]. Topic: Clinical - Red Word Triage >> Nov 25, 2023  1:55 PM Sophia H wrote: Red Word that prompted transfer to Nurse Triage: Patient states she forgot to mention to PCP yesterday that she hit her chest over the weekend with a heavy container and she now has a knot on her left side collar bone, has been having trouble breathing. If she takes a deep breath or coughs it does hurt. Has been taking tylenol  to help ease the pain but she believes she may need to have it checked out by provider. NT Reason for Disposition  [1] MODERATE pain (e.g., interferes with normal activities) AND [2] high-risk adult (e.g., age > 60 years, osteoporosis, chronic steroid use)  Answer Assessment - Initial Assessment Questions 1. MECHANISM: How did the injury happen?     States she was cleaning out her closet and hit the area with something heavy as she was trying to put the object on the top shelf 2. ONSET: When did the injury happen? (.e.g., minutes, hours, days ago)     Saturday 3. LOCATION: Where on the chest is the injury located?     Just below left collarbone, states pain radiates to back  4. APPEARANCE: What does the injury look like?     A huge knot, denies bruising 5. BLEEDING: Is there any bleeding now? If Yes, ask: How long has it been bleeding?     Denies bleeding 6. SEVERITY: Any difficulty with breathing?     States she experiences pain when taking a deep breath, patient able to speak in clear and  complete sentences while on phone with this RN, no labored breathing detected 7. SIZE: For cuts, bruises, or swelling, ask: How large is it? (e.g., inches or centimeters)     The size of a baseball 8. PAIN: Is there pain? If Yes, ask: How bad is the pain? (e.g., Scale 0-10; none, mild, moderate, severe)     Pain when coughing and upon movement, rates pain a 9   Patient saw PCP in office yesterday and did not mention symptoms to provider. Patient denied NV.  Protocols used: Chest Injury-A-AH

## 2023-11-26 ENCOUNTER — Ambulatory Visit (INDEPENDENT_AMBULATORY_CARE_PROVIDER_SITE_OTHER): Admitting: Family Medicine

## 2023-11-26 ENCOUNTER — Encounter: Payer: Self-pay | Admitting: Family Medicine

## 2023-11-26 VITALS — BP 120/72 | HR 65 | Temp 98.1°F | Ht 65.0 in | Wt 115.3 lb

## 2023-11-26 DIAGNOSIS — S299XXA Unspecified injury of thorax, initial encounter: Secondary | ICD-10-CM | POA: Diagnosis not present

## 2023-11-26 MED ORDER — TRAMADOL HCL 50 MG PO TABS: 50.0000 mg | ORAL_TABLET | Freq: Three times a day (TID) | ORAL | 0 refills | Status: AC | PRN

## 2023-11-26 NOTE — Progress Notes (Signed)
 ACUTE VISIT   Patient: Melissa Buck   DOB: 1950-03-18   73 y.o. Female  MRN: [id number]   PCP: Wellington Curtis LABOR, FNP  Chief Complaint  Patient presents with   Acute Visit    Patient is here because she has a knot on the left side of her sternum.  Reports it hurts for her to breath and if doing strenuous activities its painful.  Last night she used a heating pad and states it does ease it some.  States that it started on Saturday.   Subjective    HPI HPI     Acute Visit    Additional comments: Patient is here because she has a knot on the left side of her sternum.  Reports it hurts for her to breath and if doing strenuous activities its painful.  Last night she used a heating pad and states it does ease it some.  States that it started on Saturday.      Last edited by Terrel Powell CROME, CMA on 11/26/2023 10:11 AM.       Discussed the use of AI scribe software for clinical note transcription with the patient, who gave verbal consent to proceed.  History of Present Illness Melissa Buck is a 73 year old female who presents with chest pain after being hit by a box.  She experiences left-sided chest pain after being hit by a box while cleaning out a closet. The pain is tender and worsens with deep breathing, coughing, or clearing her throat. She has been taking over-the-counter strength Tylenol , three tablets three times a day, but it is not providing relief.  No shortness of breath, heart racing, or dizziness except when the pain is present. She attributes some anxiety to the pain. She has not used ice but has tried a heating pad for relief.  Her past medical history includes recent shoulder surgery in April. She has not yet completed her mammogram for this year and denies any history of osteoporosis.  In terms of social history, she is left-handed, which makes the pain more limiting, and she is unable to perform household chores such as cleaning due to the pain.  She also mentions that she cannot lift her dog, who weighs about 14 or 15 pounds.     Medications: Outpatient Medications Prior to Visit  Medication Sig   albuterol  (VENTOLIN  HFA) 108 (90 Base) MCG/ACT inhaler Inhale 2 puffs into the lungs every 6 (six) hours as needed for wheezing or shortness of breath.   azelastine  (ASTELIN ) 0.1 % nasal spray Place 2 sprays into both nostrils 2 (two) times daily. Use in each nostril as directed (Patient taking differently: Place 2 sprays into both nostrils as needed for allergies. Use in each nostril as directed)   cetirizine  (ZYRTEC ) 10 MG tablet TAKE 1 TABLET (10 MG TOTAL) BY MOUTH DAILY.   Cholecalciferol (VITAMIN D3) 125 MCG (5000 UT) CHEW Chew 5,000 Units by mouth daily.   EPINEPHrine  0.3 mg/0.3 mL IJ SOAJ injection Inject 0.3 mg into the muscle as needed for anaphylaxis.   escitalopram  (LEXAPRO ) 20 MG tablet Take 1 tablet (20 mg total) by mouth daily.   estradiol  (ESTRACE  VAGINAL) 0.1 MG/GM vaginal cream Place 1 Applicatorful vaginally at bedtime. Nightly for 2 weeks, then 1-2x per week after.   ezetimibe  (ZETIA ) 10 MG tablet Take 1 tablet (10 mg total) by mouth daily.   fluticasone  (FLONASE ) 50 MCG/ACT nasal spray Place 2 sprays into both nostrils  daily.   metoCLOPramide  (REGLAN ) 10 MG tablet Take 1 tablet (10 mg total) by mouth 3 (three) times daily as needed for nausea.   Multiple Vitamin (MULTIVITAMIN) tablet Take 1 tablet by mouth daily.   Probiotic Product (PROBIOTIC ADVANCED PO) Take 1 capsule by mouth.   traZODone  (DESYREL ) 50 MG tablet TAKE 1 TABLET(50 MG) BY MOUTH AT BEDTIME AS NEEDED FOR SLEEP   vitamin B-12 (CYANOCOBALAMIN) 1000 MCG tablet Take 1,000 mcg by mouth daily.   No facility-administered medications prior to visit.        Objective    BP 120/72 (BP Location: Left Arm, Patient Position: Sitting, Cuff Size: Normal)   Pulse 65   Temp 98.1 F (36.7 C) (Oral)   Ht 5' 5 (1.651 m)   Wt 115 lb 4.8 oz (52.3 kg)   SpO2 100%    BMI 19.19 kg/m    Physical Exam Cardiovascular:     Rate and Rhythm: Normal rate and regular rhythm.  Pulmonary:     Effort: Pulmonary effort is normal. No respiratory distress.     Breath sounds: Normal breath sounds. No stridor. No wheezing, rhonchi or rales.  Chest:     Chest wall: Tenderness present.        Physical Exam VITALS: SaO2- 100% CHEST: Swelling, soft, tender to palpation at second intercostal space, no ecchymosis, no skin break, no laceration. No wheezing. No respiratory distress. CARDIOVASCULAR: Regular rate and rhythm.   No results found for any visits on 11/26/23.  Assessment & Plan     Assessment and Plan Assessment & Plan Left chest wall pain and swelling after trauma with suspected rib fracture and soft tissue injury Intermittent left chest wall pain and swelling following trauma from a box hitting the chest. Pain exacerbated by deep breathing, coughing, and throat clearing. Tenderness in the left pectoral region without overlying ecchymosis. Differential diagnosis includes rib fracture, soft tissue injury, muscle or nerve involvement, and hematoma. Low suspicion for blood clot due to normal oxygen saturation and heart rate. No signs of tumor or cancerous growth. Pain not relieved by over-the-counter Tylenol . - Order chest x-ray to evaluate for rib fracture. - Order ultrasound of the left pectoral region to assess for soft tissue injury. - Prescribe tramadol  50 mg every 8 hours for pain management. - Advise use of ice to reduce inflammation and swelling. - Instruct to avoid heavy lifting and limit activities that exacerbate pain.  General Health Maintenance Mammogram for breast cancer screening is overdue. - Schedule mammogram for breast cancer screening.      Return in about 1 month (around 12/26/2023) for chest wall injury .        Rockie Agent, MD  [facility] [phone] (phone) [phone]  (fax)  [facility]

## 2023-11-26 NOTE — Patient Instructions (Signed)
 To keep you healthy, please keep in mind the following health maintenance items that you are due for:   Health Maintenance Due  Topic Date Due   Zoster Vaccines- Shingrix (2 of 2) 02/08/2022   COVID-19 Vaccine (3 - 2025-26 season) 11/03/2023     Best Wishes,   Dr. Lang

## 2023-11-27 ENCOUNTER — Ambulatory Visit
Admission: RE | Admit: 2023-11-27 | Discharge: 2023-11-27 | Disposition: A | Discharge status: Home / Self Care | Source: Ambulatory Visit | Attending: Family Medicine | Admitting: Family Medicine

## 2023-11-27 ENCOUNTER — Ambulatory Visit: Payer: Self-pay | Admitting: Family Medicine

## 2023-11-27 DIAGNOSIS — S299XXA Unspecified injury of thorax, initial encounter: Secondary | ICD-10-CM | POA: Diagnosis not present

## 2023-11-27 DIAGNOSIS — S29001A Unspecified injury of muscle and tendon of front wall of thorax, initial encounter: Secondary | ICD-10-CM | POA: Diagnosis not present

## 2023-12-22 ENCOUNTER — Ambulatory Visit: Admitting: Family Medicine

## 2024-01-21 ENCOUNTER — Ambulatory Visit: Payer: Self-pay

## 2024-01-21 NOTE — Telephone Encounter (Signed)
 FYI Only or Action Required?: FYI only for provider: appointment scheduled on 01/22/24.  Patient was last seen in primary care on 11/26/2023 by Sharma Coyer, MD.  Called Nurse Triage reporting Cough, Fatigue, and Dizziness.  Symptoms began a week ago.  Interventions attempted: OTC medications: Tylenol , Mucinex and Rest, hydration, or home remedies.  Symptoms are: unchanged.  Triage Disposition: See Physician Within 24 Hours  Patient/caregiver understands and will follow disposition?: Yes Reason for Disposition  [1] Continuous (nonstop) coughing interferes with work or school AND [2] no improvement using cough treatment per Care Advice  Answer Assessment - Initial Assessment Questions Taken Tylenol , mucinex, cough medicine. States the week prior a friend was sick, grandson was sick and person she lives with is sick. No one tested for Covid. Uses Albuterol  inhaler  1. ONSET: When did the cough begin?      Last week  2. SEVERITY: How bad is the cough today?      Moderate  3. SPUTUM: Describe the color of your sputum (e.g., none, dry cough; clear, white, yellow, green)     Yellow-ish  4. DIFFICULTY BREATHING: Are you having difficulty breathing? If Yes, ask: How bad is it? (e.g., mild, moderate, severe)      Wheezing, but denies SOB  5. FEVER: Do you have a fever? If Yes, ask: What is your temperature, how was it measured, and when did it start?     100.1 on Saturday, has not checked since then, but is still breaking out in the sweats a couple times a day.   6. CARDIAC HISTORY: Do you have any history of heart disease? (e.g., heart attack, congestive heart failure)      Diagnosed with mini stroke,  a couple  7. LUNG HISTORY: Do you have any history of lung disease?  (e.g., pulmonary embolus, asthma, emphysema)     Was told has COPD but patient states she only smokes 3 cigarettes today  8. OTHER SYMPTOMS: Do you have any other symptoms? (e.g.,  runny nose, wheezing, chest pain)       Fatigue, body aches, dizziness.  Protocols used: Cough - Acute Productive-A-AH  Copied from CRM #[id number]. Topic: Clinical - Red Word Triage >> Jan 21, 2024 12:18 PM Drema MATSU wrote: Red Word that prompted transfer to Nurse Triage: Patient is not feeling well. Symptoms: coughing, congested, tired, not sleeping, dizzy, light headed, shortness of breath,

## 2024-01-22 ENCOUNTER — Encounter: Payer: Self-pay | Admitting: Family Medicine

## 2024-01-22 ENCOUNTER — Ambulatory Visit: Admitting: Family Medicine

## 2024-01-22 VITALS — BP 126/82 | HR 67 | Temp 98.4°F | Ht 65.0 in | Wt 117.0 lb

## 2024-01-22 DIAGNOSIS — R0981 Nasal congestion: Secondary | ICD-10-CM | POA: Diagnosis not present

## 2024-01-22 DIAGNOSIS — J0140 Acute pansinusitis, unspecified: Secondary | ICD-10-CM

## 2024-01-22 DIAGNOSIS — J069 Acute upper respiratory infection, unspecified: Secondary | ICD-10-CM | POA: Diagnosis not present

## 2024-01-22 DIAGNOSIS — R051 Acute cough: Secondary | ICD-10-CM | POA: Diagnosis not present

## 2024-01-22 LAB — POC COVID19 BINAXNOW: SARS Coronavirus 2 Ag: NEGATIVE

## 2024-01-22 LAB — POCT INFLUENZA A/B
Influenza A, POC: NEGATIVE
Influenza B, POC: NEGATIVE

## 2024-01-22 MED ORDER — AMOXICILLIN-POT CLAVULANATE 875-125 MG PO TABS
1.0000 | ORAL_TABLET | Freq: Two times a day (BID) | ORAL | 0 refills | Status: AC
Start: 2024-01-22 — End: ?

## 2024-01-22 MED ORDER — PROMETHAZINE-DM 6.25-15 MG/5ML PO SYRP: 2.5000 mL | ORAL_SOLUTION | Freq: Four times a day (QID) | ORAL | 0 refills | Status: AC | PRN

## 2024-01-22 MED ORDER — ALBUTEROL SULFATE HFA 108 (90 BASE) MCG/ACT IN AERS: 2.0000 | INHALATION_SPRAY | Freq: Four times a day (QID) | RESPIRATORY_TRACT | 3 refills | Status: AC | PRN

## 2024-01-22 NOTE — Progress Notes (Signed)
 Acute Office Visit  Introduced to nurse practitioner role and practice setting.  All questions answered.  Discussed provider/patient relationship and expectations.   Subjective:     Patient ID: Melissa Buck, female    DOB: 04-25-1950, 73 y.o.   MRN: [id number]  Chief Complaint  Patient presents with   Cough    Patient has been having a cough for about a week.  The household has all been sick with similar symptoms.  No one has been tested for covid of flu.  Patient has had fever, SOB and sweats.    Discussed the use of AI scribe software for clinical note transcription with the patient, who gave verbal consent to proceed.  History of Present Illness  Persistent fatigue, cough, congestion, has been exposed to others in her family who has been sick. Symptoms have not reliefs with OTC mucinex, tylenol , rest, and hydration. Ongoing for over a week, about 8 days.   Review of Systems  Constitutional:  Positive for chills, diaphoresis and malaise/fatigue.  HENT:  Positive for congestion, sinus pain and sore throat. Negative for ear pain.   Respiratory:  Positive for cough and sputum production. Negative for shortness of breath and wheezing.   Cardiovascular:  Negative for chest pain, palpitations and orthopnea.  Genitourinary:  Negative for dysuria.  Skin:  Negative for itching and rash.        Objective:    BP 126/82 (BP Location: Left Arm, Patient Position: Sitting, Cuff Size: Normal)   Pulse 67   Temp 98.4 F (36.9 C) (Oral)   Ht 5' 5 (1.651 m)   Wt 117 lb (53.1 kg)   SpO2 100%   BMI 19.47 kg/m    Physical Exam Constitutional:      General: She is not in acute distress.    Appearance: She is well-developed and normal weight. She is not ill-appearing, toxic-appearing or diaphoretic.  HENT:     Head: Normocephalic.     Right Ear: Tympanic membrane and ear canal normal. No drainage, swelling or tenderness. No middle ear effusion. Tympanic membrane is not injected,  scarred, erythematous or bulging.     Left Ear: Tympanic membrane and ear canal normal. No drainage, swelling or tenderness.  No middle ear effusion. Tympanic membrane is not injected, scarred, erythematous or bulging.     Nose: Congestion present. No rhinorrhea.     Right Turbinates: Enlarged.     Left Turbinates: Enlarged.     Right Sinus: Maxillary sinus tenderness and frontal sinus tenderness present.     Left Sinus: Maxillary sinus tenderness and frontal sinus tenderness present.     Mouth/Throat:     Mouth: Mucous membranes are moist. No oral lesions.     Pharynx: Uvula midline. Posterior oropharyngeal erythema present. No pharyngeal swelling, oropharyngeal exudate or uvula swelling.     Tonsils: No tonsillar exudate or tonsillar abscesses. 0 on the right. 0 on the left.  Eyes:     Extraocular Movements:     Right eye: Normal extraocular motion.     Left eye: Normal extraocular motion.     Conjunctiva/sclera: Conjunctivae normal.     Pupils: Pupils are equal, round, and reactive to light.  Cardiovascular:     Rate and Rhythm: Normal rate and regular rhythm.     Heart sounds: No murmur heard.    No gallop.  Pulmonary:     Effort: Pulmonary effort is normal. No respiratory distress.     Breath sounds: Normal breath sounds. No  stridor. No wheezing, rhonchi or rales.  Chest:     Chest wall: No tenderness.  Lymphadenopathy:     Cervical: Cervical adenopathy present.  Neurological:     Mental Status: She is alert.     Results for orders placed or performed in visit on 01/22/24  POCT Influenza A/B  Result Value Ref Range   Influenza A, POC Negative Negative   Influenza B, POC Negative Negative  POC COVID-19  Result Value Ref Range   SARS Coronavirus 2 Ag Negative Negative        Assessment & Plan:  Assessment and Plan Assessment & Plan  Upper Respiratory in fection - associated with cough, congestion, sore throat chills, malaise Pt presents with persistent upper  respiratory symptoms. Most concerning is her sinus headaches, cough, congestion, and malaise. Intermittent sweating, no fever during visit. Vital stable. Denies DOB, SOB, respiratory distress, able to eat and drink. Mainly drinking water  and eating soup. She slept most of the day yesterday - family members sick as well, no one tested for viral infections - pt today negative for flu and COVID - most likely started at viral or allergy in nature with change of seasons, but appears to have develops sinusitis and start of bronchitis - Reordered patients albuterol  - no wheezing during exam - will hold on steroid pack - given onsent, symptoms, and co morbidities - Prescribed augmentin  Bid for 7 days - Promethazine - DM for cough - continue hot tea with honey, gargle with warm salt water  - Ensure adequate hydration - Rest - tylenol  or ibuprofen for pain/ fever mgmt   Problem List Items Addressed This Visit   None Visit Diagnoses       Acute cough    -  Primary   Relevant Medications   albuterol  (VENTOLIN  HFA) 108 (90 Base) MCG/ACT inhaler   promethazine -dextromethorphan (PROMETHAZINE -DM) 6.25-15 MG/5ML syrup   Other Relevant Orders   POCT Influenza A/B (Completed)   POC COVID-19 (Completed)     Congestion of nasal sinus         Upper respiratory tract infection, unspecified type         Acute non-recurrent pansinusitis       Relevant Medications   amoxicillin -clavulanate (AUGMENTIN ) 875-125 MG tablet   promethazine -dextromethorphan (PROMETHAZINE -DM) 6.25-15 MG/5ML syrup       Meds ordered this encounter  Medications   amoxicillin -clavulanate (AUGMENTIN ) 875-125 MG tablet    Sig: Take 1 tablet by mouth 2 (two) times daily.    Dispense:  14 tablet    Refill:  0   albuterol  (VENTOLIN  HFA) 108 (90 Base) MCG/ACT inhaler    Sig: Inhale 2 puffs into the lungs every 6 (six) hours as needed for wheezing or shortness of breath.    Dispense:  18 g    Refill:  3    promethazine -dextromethorphan (PROMETHAZINE -DM) 6.25-15 MG/5ML syrup    Sig: Take 2.5 mLs by mouth 4 (four) times daily as needed for cough.    Dispense:  118 mL    Refill:  0    No follow-ups on file.  Curtis DELENA Boom, FNP  I, Curtis DELENA Boom, FNP, have reviewed all documentation for this visit. The documentation on 01/22/24 for the exam, diagnosis, procedures, and orders are all accurate and complete.

## 2024-01-30 ENCOUNTER — Telehealth: Payer: Self-pay

## 2024-03-05 ENCOUNTER — Other Ambulatory Visit: Payer: Self-pay

## 2024-03-05 DIAGNOSIS — F5101 Primary insomnia: Secondary | ICD-10-CM

## 2024-03-05 NOTE — Telephone Encounter (Signed)
 Copied from CRM [id number]. Topic: Clinical - Medication Refill >> Mar 05, 2024  3:21 PM Winona R wrote: Medication:  traZODone  (DESYREL ) 50 MG tablet   Has the patient contacted their pharmacy? No (Agent: If no, request that the patient contact the pharmacy for the refill. If patient does not wish to contact the pharmacy document the reason why and proceed with request.) (Agent: If yes, when and what did the pharmacy advise?)  This is the patient's preferred pharmacy:  [id number] [place], [place] - [address] AT [facility] [address] [place] [address] [place] [address] Phone: [phone] Fax: [phone]  [id number] - [place], [place] - [address] [place] [address] Phone: [phone] Fax: [phone]  Is this the correct pharmacy for this prescription? Yes If no, delete pharmacy and type the correct one.   Has the prescription been filled recently? Yes  Is the patient out of the medication? Yes  Has the patient been seen for an appointment in the last year OR does the patient have an upcoming appointment? Yes  Can we respond through MyChart? No  Agent: Please be advised that Rx refills may take up to 3 business days. We ask that you follow-up with your pharmacy.

## 2024-03-08 MED ORDER — TRAZODONE HCL 50 MG PO TABS: 50.0000 mg | ORAL_TABLET | Freq: Every evening | ORAL | 0 refills | Status: AC | PRN

## 2024-03-08 NOTE — Telephone Encounter (Signed)
 Requested medication (s) are due for refill today: yes   Requested medication (s) are on the active medication list: yes   Last refill:  10/22/23 #90 0 refills  Future visit scheduled: yes 04/30/24  Notes to clinic:  multiple pharmacies listed. Attempted to contact patient to verify which pharmacy requested no answer, unable to leave message to call back. Do you want to send to last pharmacy medication sent to ?     Requested Prescriptions  Pending Prescriptions Disp Refills   traZODone  (DESYREL ) 50 MG tablet 90 tablet 0     Psychiatry: Antidepressants - Serotonin Modulator Passed - 03/08/2024 11:11 AM      Passed - Completed PHQ-2 or PHQ-9 in the last 360 days      Passed - Valid encounter within last 6 months    Recent Outpatient Visits           1 month ago Acute cough   [facility] [place], [place] [facility], FNP   3 months ago Chest wall injury, initial encounter   [facility] [place], [place], [place]   3 months ago Severe episode of recurrent major depressive disorder, with psychotic features [facility])   [place] [place], [place] [facility]OR, [place]

## 2024-03-08 NOTE — Telephone Encounter (Signed)
 Called patient to confirm which pharmacy to send medication refill. No answer, unable to leave message after multiple rings, recording call can not be completed at this time and try call again later.

## 2024-03-19 NOTE — Telephone Encounter (Signed)
 Melissa Buck

## 2024-04-01 ENCOUNTER — Other Ambulatory Visit: Payer: Self-pay

## 2024-04-01 ENCOUNTER — Telehealth: Payer: Self-pay | Admitting: Family Medicine

## 2024-04-01 DIAGNOSIS — E7800 Pure hypercholesterolemia, unspecified: Secondary | ICD-10-CM

## 2024-04-01 MED ORDER — EZETIMIBE 10 MG PO TABS: 10.0000 mg | ORAL_TABLET | Freq: Every day | ORAL | 0 refills | Status: AC

## 2024-04-01 NOTE — Telephone Encounter (Signed)
[facility] faxed refill request for the following medications: ? ?ezetimibe (ZETIA) 10 MG tablet  ? ?Please advise. ? ?

## 2024-04-01 NOTE — Telephone Encounter (Signed)
 Converted and courtesy refill given

## 2024-04-30 ENCOUNTER — Encounter

## 2024-08-17 ENCOUNTER — Ambulatory Visit
